# Patient Record
Sex: Male | Born: 1945 | Race: White | Hispanic: No | Smoking: Former smoker
Health system: Southern US, Community
[De-identification: ages and names within clinical notes are randomized; demographics above are authoritative.]

## PROBLEM LIST (undated history)

## (undated) DIAGNOSIS — T753XXA Motion sickness, initial encounter: Secondary | ICD-10-CM

## (undated) DIAGNOSIS — I2510 Atherosclerotic heart disease of native coronary artery without angina pectoris: Secondary | ICD-10-CM

## (undated) DIAGNOSIS — F329 Major depressive disorder, single episode, unspecified: Secondary | ICD-10-CM

## (undated) DIAGNOSIS — K635 Polyp of colon: Secondary | ICD-10-CM

## (undated) DIAGNOSIS — F32A Depression, unspecified: Secondary | ICD-10-CM

## (undated) DIAGNOSIS — F1010 Alcohol abuse, uncomplicated: Secondary | ICD-10-CM

## (undated) DIAGNOSIS — E785 Hyperlipidemia, unspecified: Secondary | ICD-10-CM

## (undated) DIAGNOSIS — E119 Type 2 diabetes mellitus without complications: Secondary | ICD-10-CM

## (undated) DIAGNOSIS — I10 Essential (primary) hypertension: Secondary | ICD-10-CM

## (undated) HISTORY — PX: TONSILLECTOMY: SUR1361

## (undated) HISTORY — PX: COLONOSCOPY: SHX174

## (undated) HISTORY — DX: Hyperlipidemia, unspecified: E78.5

## (undated) HISTORY — DX: Atherosclerotic heart disease of native coronary artery without angina pectoris: I25.10

## (undated) HISTORY — DX: Polyp of colon: K63.5

## (undated) HISTORY — DX: Type 2 diabetes mellitus without complications: E11.9

## (undated) HISTORY — DX: Alcohol abuse, uncomplicated: F10.10

## (undated) HISTORY — DX: Major depressive disorder, single episode, unspecified: F32.9

## (undated) HISTORY — DX: Depression, unspecified: F32.A

## (undated) HISTORY — DX: Essential (primary) hypertension: I10

---

## 2002-05-20 DIAGNOSIS — E119 Type 2 diabetes mellitus without complications: Secondary | ICD-10-CM

## 2002-05-20 DIAGNOSIS — E785 Hyperlipidemia, unspecified: Secondary | ICD-10-CM

## 2002-05-20 DIAGNOSIS — I10 Essential (primary) hypertension: Secondary | ICD-10-CM

## 2002-05-20 HISTORY — DX: Hyperlipidemia, unspecified: E78.5

## 2002-05-20 HISTORY — DX: Type 2 diabetes mellitus without complications: E11.9

## 2002-05-20 HISTORY — DX: Essential (primary) hypertension: I10

## 2002-07-20 LAB — FECAL OCCULT BLOOD, GUAIAC: Fecal Occult Blood: NEGATIVE

## 2004-10-17 ENCOUNTER — Ambulatory Visit: Payer: Self-pay | Admitting: Family Medicine

## 2004-11-23 ENCOUNTER — Ambulatory Visit: Payer: Self-pay | Admitting: Family Medicine

## 2005-04-20 DIAGNOSIS — I2510 Atherosclerotic heart disease of native coronary artery without angina pectoris: Secondary | ICD-10-CM

## 2005-04-20 HISTORY — PX: CORONARY ARTERY BYPASS GRAFT: SHX141

## 2005-04-20 HISTORY — DX: Atherosclerotic heart disease of native coronary artery without angina pectoris: I25.10

## 2005-05-06 ENCOUNTER — Inpatient Hospital Stay (HOSPITAL_COMMUNITY): Admission: EM | Admit: 2005-05-06 | Discharge: 2005-05-14 | Payer: Self-pay | Admitting: Emergency Medicine

## 2005-05-06 ENCOUNTER — Ambulatory Visit: Payer: Self-pay | Admitting: Internal Medicine

## 2005-05-30 ENCOUNTER — Ambulatory Visit: Payer: Self-pay | Admitting: Internal Medicine

## 2005-06-05 ENCOUNTER — Ambulatory Visit: Payer: Self-pay | Admitting: Family Medicine

## 2005-06-20 ENCOUNTER — Ambulatory Visit: Payer: Self-pay

## 2005-06-21 ENCOUNTER — Encounter (HOSPITAL_COMMUNITY): Admission: RE | Admit: 2005-06-21 | Discharge: 2005-08-17 | Payer: Self-pay | Admitting: Internal Medicine

## 2005-08-14 ENCOUNTER — Ambulatory Visit: Payer: Self-pay | Admitting: Internal Medicine

## 2005-08-14 ENCOUNTER — Encounter: Payer: Self-pay | Admitting: Internal Medicine

## 2005-08-14 ENCOUNTER — Ambulatory Visit: Payer: Self-pay

## 2005-08-17 ENCOUNTER — Ambulatory Visit: Payer: Self-pay | Admitting: Cardiology

## 2005-11-21 ENCOUNTER — Ambulatory Visit: Payer: Self-pay | Admitting: Family Medicine

## 2005-12-07 IMAGING — CR DG CHEST 2V
2 series · 2 of 2 positions shown · non-contrast
Comparison: none

CLINICAL DATA: Angina, previous CABG

Chest 2 view:
Comparison 05/11/2005. Persistent right pneumothorax estimated to 15%, without
convincing interval change since previous exam. Changes of CABG. Small bilateral
pleural effusions. Mild enlargement of cardiac silhouette. Linear atelectasis or
scarring at both lung bases.

[view not recorded (1 of 2)]
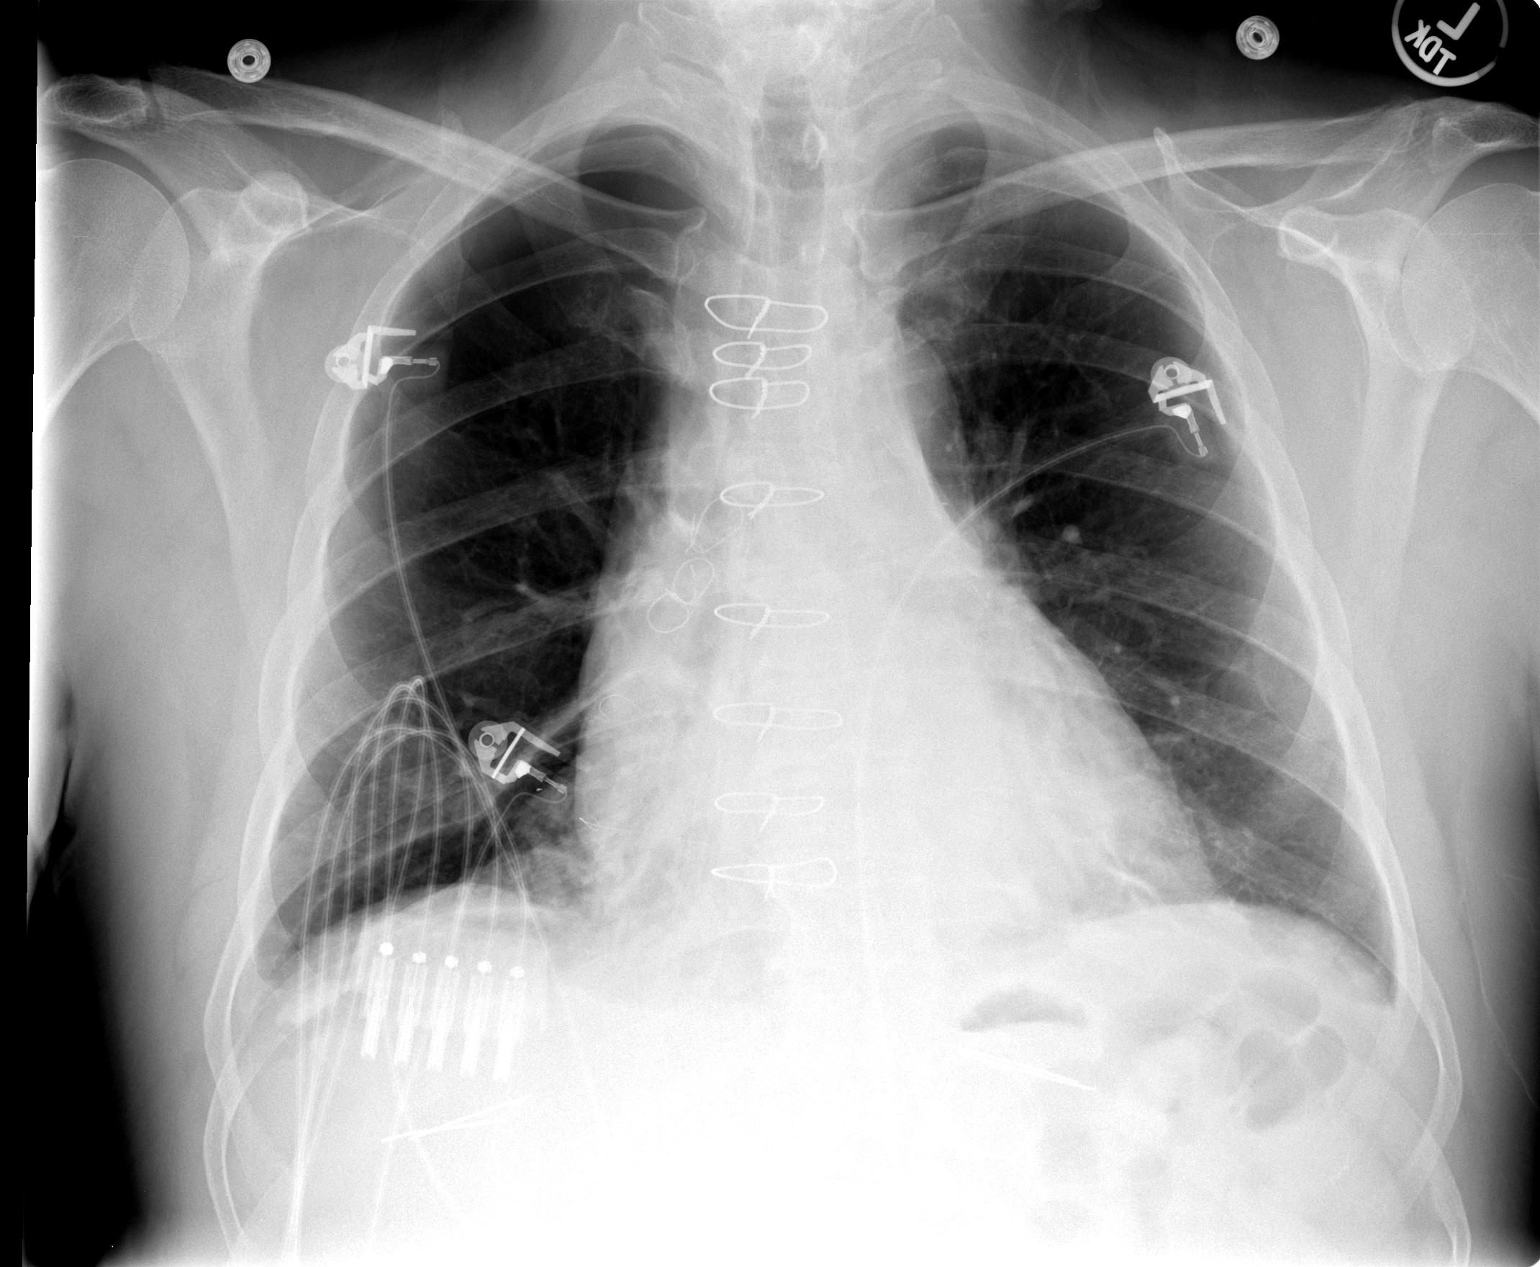

[view not recorded (2 of 2)]
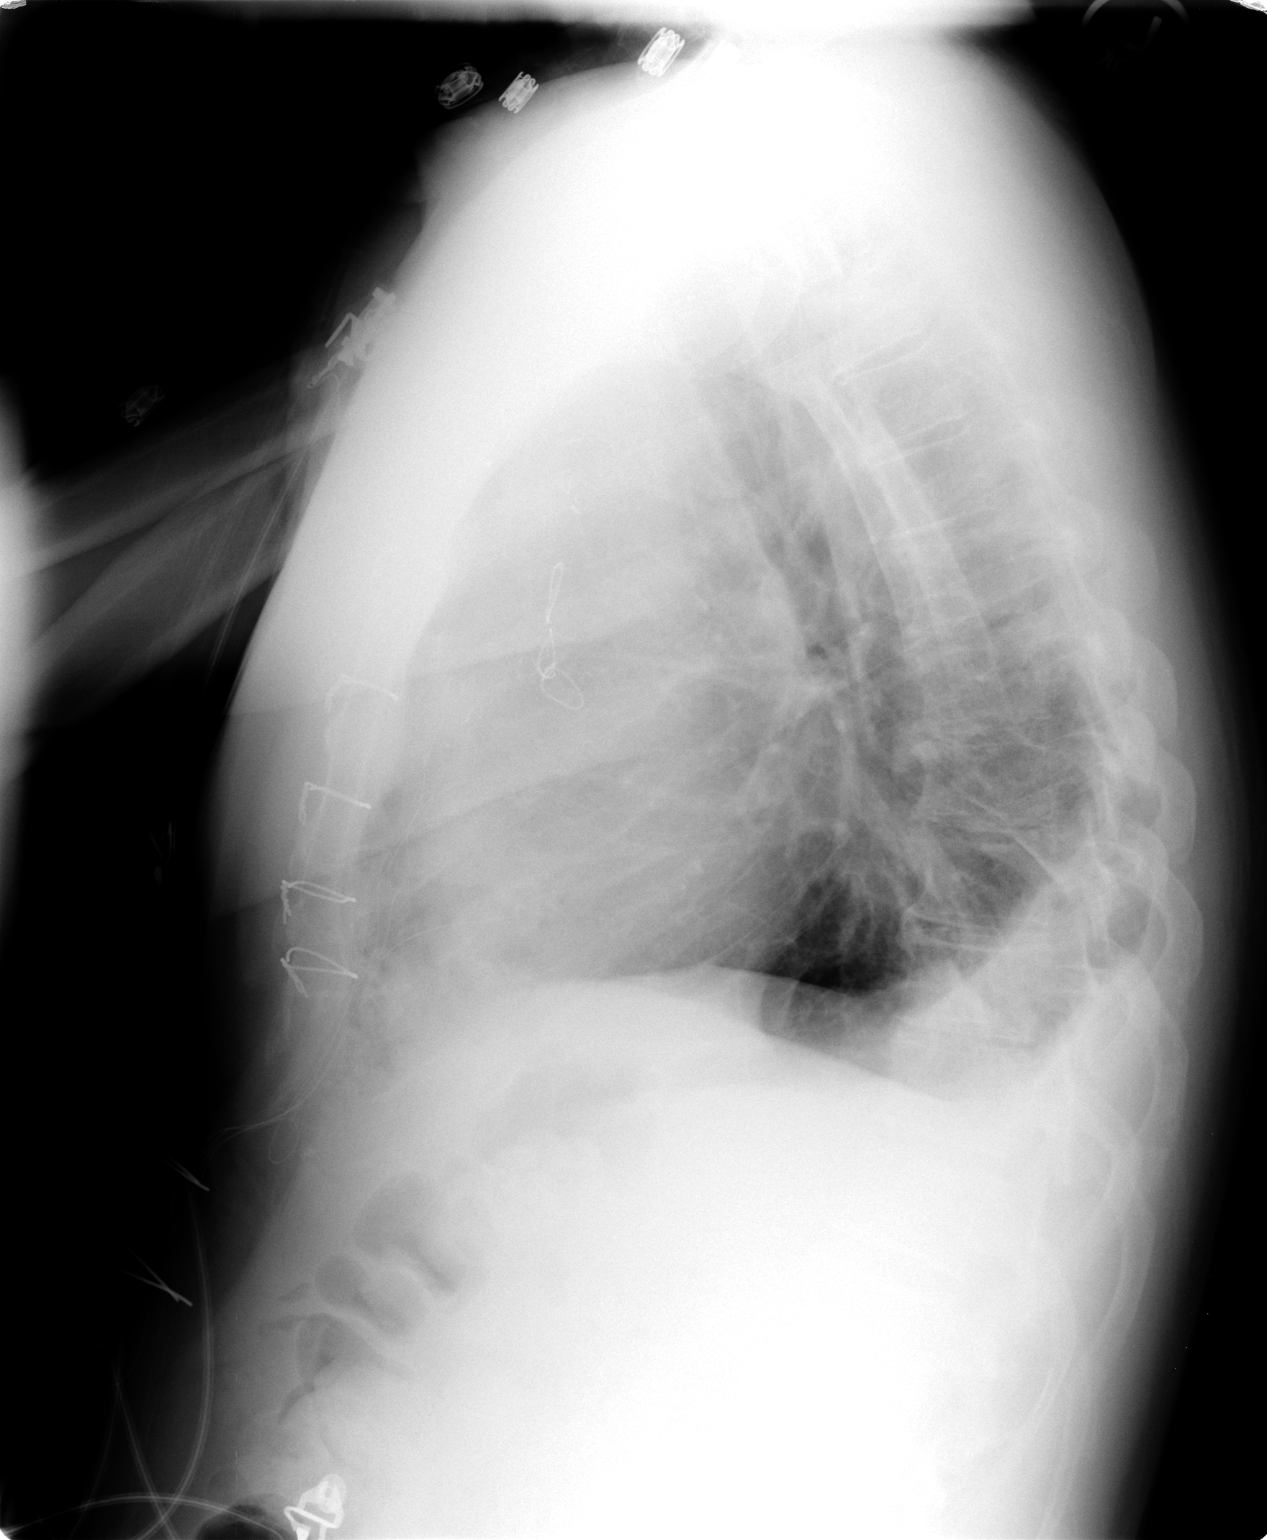

[2 of 2 positions shown; findings below may reference images not displayed]

IMPRESSION: 1. Persistent right pneumothorax.
2. Small bilateral pleural effusions

## 2005-12-08 IMAGING — CR DG CHEST 2V
2 series · 2 of 2 positions shown · non-contrast
Comparison: none

CLINICAL DATA: Angina

Chest 2 view:
Comparison 05/12/2005. Changes of CABG. There is a right pneumothorax estimated
15%, stable since previous exam. Left infrahilar atelectasis or infiltrate.
Small bilateral pleural effusions. Heart size upper limits normal.

[w chest pa]
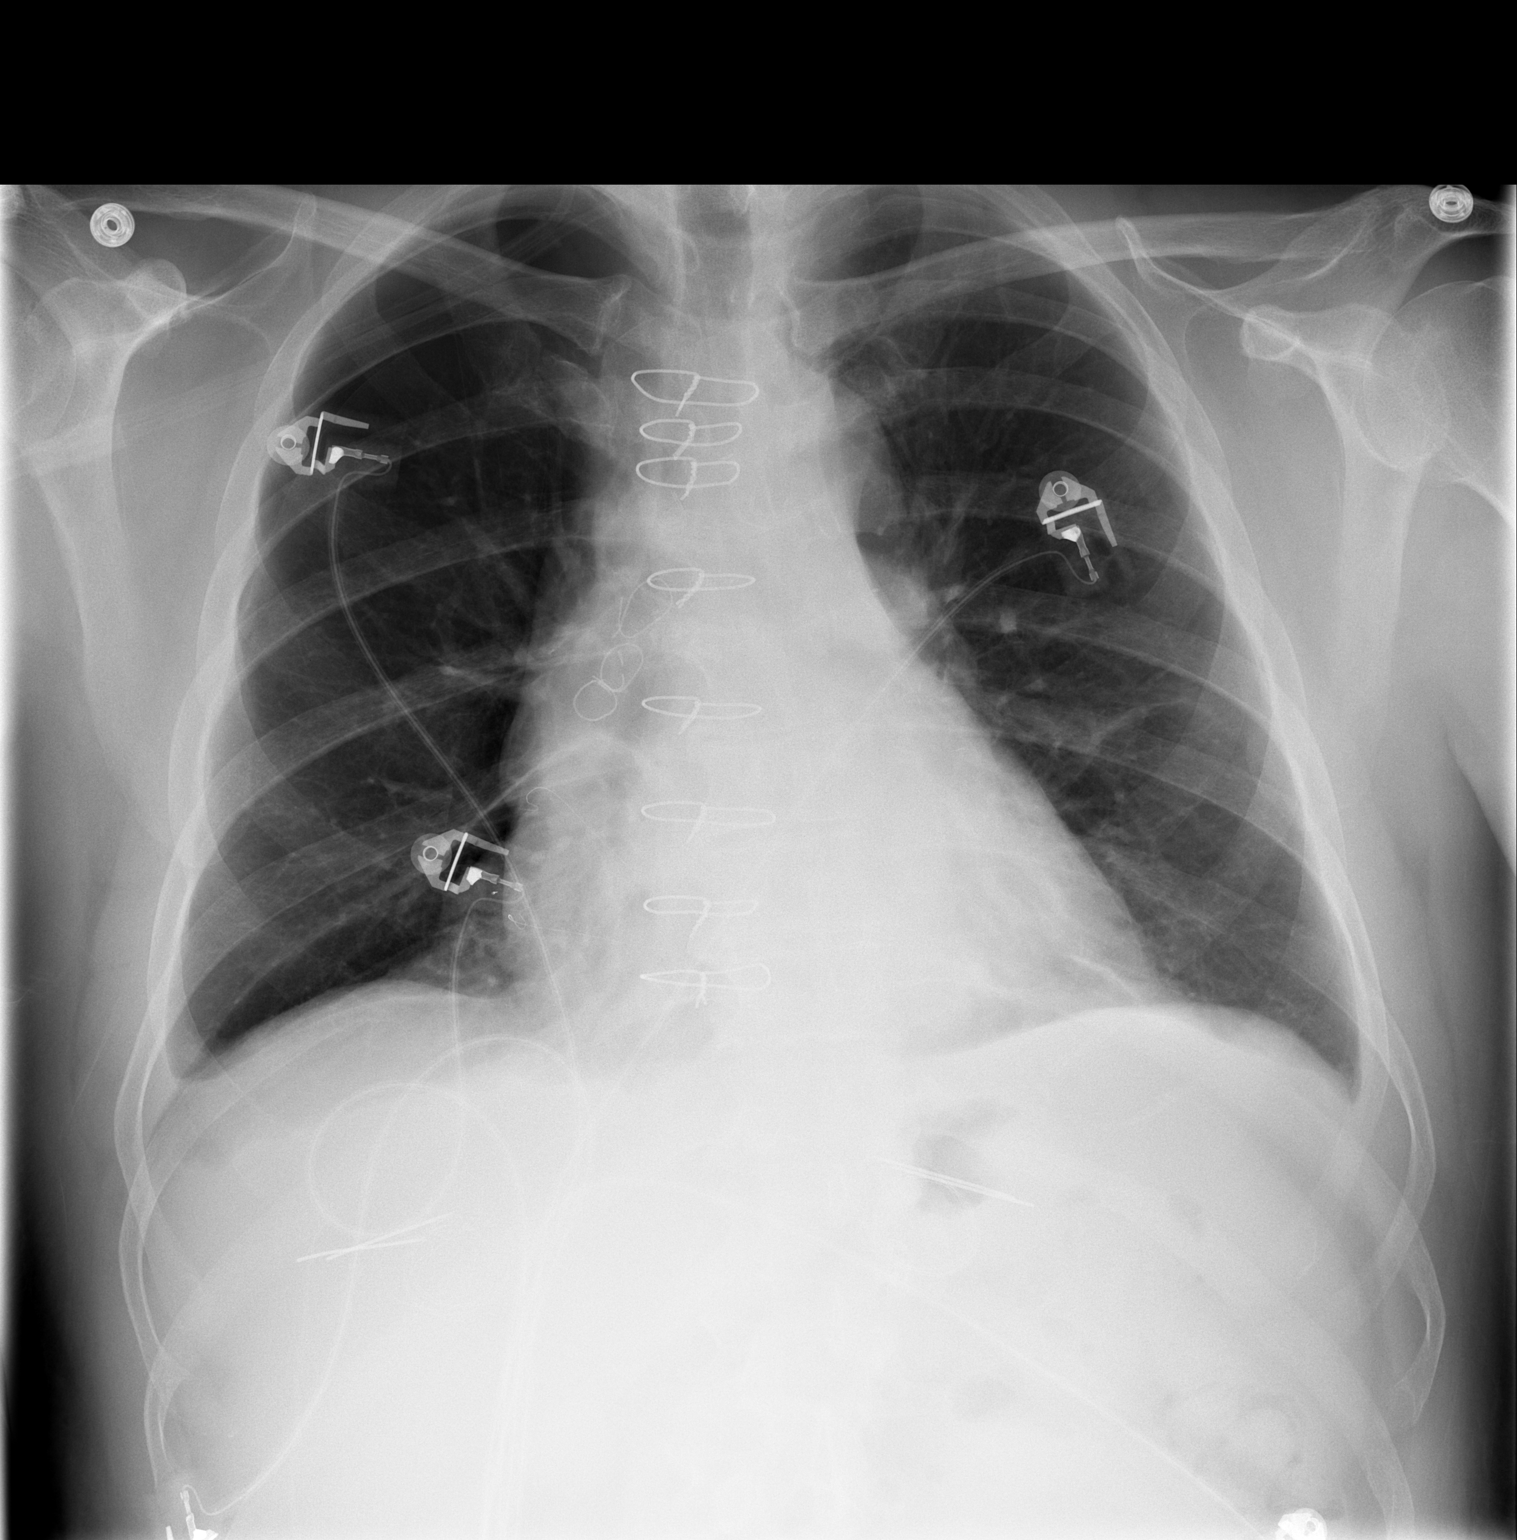

[w chest lat]
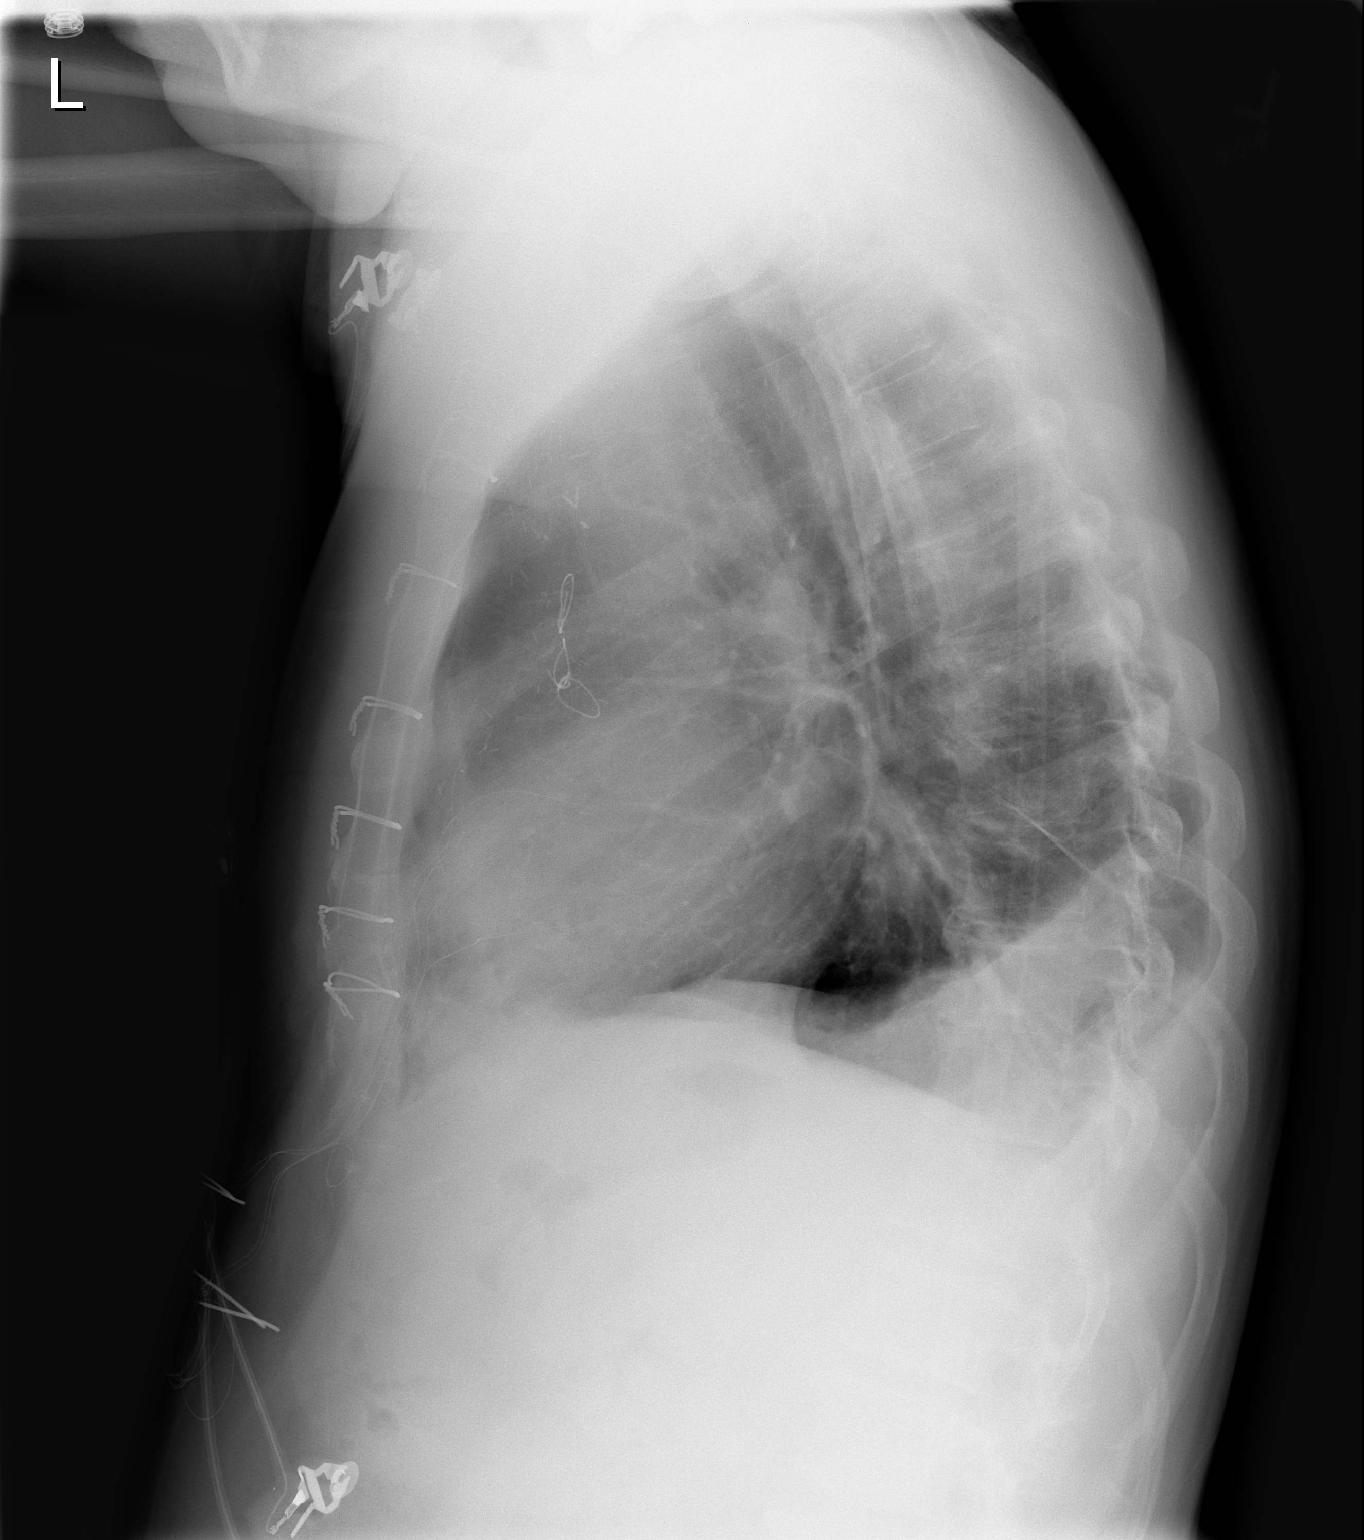

[2 of 2 positions shown; findings below may reference images not displayed]

IMPRESSION: 1. Stable right pneumothorax.
2. Small bilateral pleural effusions

## 2006-01-18 ENCOUNTER — Ambulatory Visit: Payer: Self-pay | Admitting: Family Medicine

## 2006-02-05 ENCOUNTER — Ambulatory Visit: Payer: Self-pay | Admitting: Internal Medicine

## 2006-02-17 HISTORY — PX: ESOPHAGOGASTRODUODENOSCOPY: SHX1529

## 2006-02-26 ENCOUNTER — Encounter (INDEPENDENT_AMBULATORY_CARE_PROVIDER_SITE_OTHER): Payer: Self-pay | Admitting: Specialist

## 2006-02-26 ENCOUNTER — Ambulatory Visit: Payer: Self-pay | Admitting: Internal Medicine

## 2006-03-22 ENCOUNTER — Ambulatory Visit: Payer: Self-pay | Admitting: Internal Medicine

## 2006-04-16 ENCOUNTER — Ambulatory Visit: Payer: Self-pay | Admitting: Internal Medicine

## 2006-11-28 ENCOUNTER — Ambulatory Visit: Payer: Self-pay | Admitting: Family Medicine

## 2006-11-28 LAB — CONVERTED CEMR LAB
ALT: 40 units/L (ref 0–40)
Albumin: 3.9 g/dL (ref 3.5–5.2)
Calcium: 9.5 mg/dL (ref 8.4–10.5)
Chloride: 107 meq/L (ref 96–112)
Glucose, Bld: 99 mg/dL (ref 70–99)
HDL: 33.9 mg/dL — ABNORMAL LOW (ref >39.0)
Hemoglobin: 15.9 g/dL (ref 13.0–17.0)
LDL Cholesterol: 75 mg/dL (ref 0–99)
Lymphocytes Relative: 29.5 % (ref 12.0–46.0)
MCHC: 33.6 g/dL (ref 30.0–36.0)
Monocytes Absolute: 0.6 10*3/uL (ref 0.2–0.7)
Neutrophils Relative %: 56.2 % (ref 43.0–77.0)
Phosphorus: 3.4 mg/dL (ref 2.3–4.6)
Platelets: 185 10*3/uL (ref 150–400)
Potassium: 4.2 meq/L (ref 3.5–5.1)
RDW: 13.1 % (ref 11.5–14.6)
VLDL: 30 mg/dL (ref 0–40)
WBC: 5.1 10*3/uL (ref 4.5–10.5)

## 2006-12-03 ENCOUNTER — Encounter: Payer: Self-pay | Admitting: Family Medicine

## 2006-12-03 DIAGNOSIS — F329 Major depressive disorder, single episode, unspecified: Secondary | ICD-10-CM

## 2006-12-03 DIAGNOSIS — F3289 Other specified depressive episodes: Secondary | ICD-10-CM | POA: Insufficient documentation

## 2007-01-08 ENCOUNTER — Ambulatory Visit: Payer: Self-pay | Admitting: Internal Medicine

## 2007-01-08 LAB — CONVERTED CEMR LAB: Tissue Transglutaminase Ab, IgA: <3 units (ref <5)

## 2007-03-05 ENCOUNTER — Encounter: Payer: Self-pay | Admitting: Internal Medicine

## 2007-03-05 ENCOUNTER — Encounter: Payer: Self-pay | Admitting: Family Medicine

## 2007-03-05 ENCOUNTER — Ambulatory Visit: Payer: Self-pay | Admitting: Internal Medicine

## 2007-03-12 DIAGNOSIS — Z860100 Personal history of colon polyps, unspecified: Secondary | ICD-10-CM | POA: Insufficient documentation

## 2007-12-30 ENCOUNTER — Ambulatory Visit: Payer: Self-pay | Admitting: Internal Medicine

## 2008-03-08 ENCOUNTER — Telehealth (INDEPENDENT_AMBULATORY_CARE_PROVIDER_SITE_OTHER): Payer: Self-pay | Admitting: *Deleted

## 2008-03-22 ENCOUNTER — Telehealth (INDEPENDENT_AMBULATORY_CARE_PROVIDER_SITE_OTHER): Payer: Self-pay | Admitting: *Deleted

## 2008-05-10 ENCOUNTER — Telehealth (INDEPENDENT_AMBULATORY_CARE_PROVIDER_SITE_OTHER): Payer: Self-pay | Admitting: *Deleted

## 2008-05-13 ENCOUNTER — Ambulatory Visit: Payer: Self-pay | Admitting: Family Medicine

## 2008-05-13 DIAGNOSIS — E785 Hyperlipidemia, unspecified: Secondary | ICD-10-CM | POA: Insufficient documentation

## 2009-02-18 ENCOUNTER — Encounter (INDEPENDENT_AMBULATORY_CARE_PROVIDER_SITE_OTHER): Payer: Self-pay | Admitting: *Deleted

## 2009-02-28 ENCOUNTER — Encounter: Payer: Self-pay | Admitting: Internal Medicine

## 2009-04-12 ENCOUNTER — Ambulatory Visit: Payer: Self-pay | Admitting: Family Medicine

## 2009-04-13 ENCOUNTER — Ambulatory Visit: Payer: Self-pay | Admitting: Family Medicine

## 2009-04-13 DIAGNOSIS — E119 Type 2 diabetes mellitus without complications: Secondary | ICD-10-CM | POA: Insufficient documentation

## 2009-04-13 DIAGNOSIS — E1165 Type 2 diabetes mellitus with hyperglycemia: Secondary | ICD-10-CM

## 2009-04-13 DIAGNOSIS — IMO0002 Reserved for concepts with insufficient information to code with codable children: Secondary | ICD-10-CM | POA: Insufficient documentation

## 2009-04-13 DIAGNOSIS — R5381 Other malaise: Secondary | ICD-10-CM | POA: Insufficient documentation

## 2009-04-13 DIAGNOSIS — R5383 Other fatigue: Secondary | ICD-10-CM

## 2009-04-14 LAB — CONVERTED CEMR LAB
ALT: 37 units/L (ref 0–53)
AST: 27 units/L (ref 0–37)
Albumin: 4.4 g/dL (ref 3.5–5.2)
BUN: 14 mg/dL (ref 6–23)
Basophils Relative: 0.2 % (ref 0.0–3.0)
Bilirubin, Direct: 0.2 mg/dL (ref 0.0–0.3)
CO2: 32 meq/L (ref 19–32)
Cholesterol: 134 mg/dL (ref 0–200)
Creatinine, Ser: 1.3 mg/dL (ref 0.4–1.5)
Eosinophils Absolute: 0.2 10*3/uL (ref 0.0–0.7)
Eosinophils Relative: 3.5 % (ref 0.0–5.0)
Lymphocytes Relative: 26.5 % (ref 12.0–46.0)
MCHC: 34.5 g/dL (ref 30.0–36.0)
MCV: 92.7 fL (ref 78.0–100.0)
Monocytes Relative: 12 % (ref 3.0–12.0)
Neutro Abs: 3.1 10*3/uL (ref 1.4–7.7)
Neutrophils Relative %: 57.8 % (ref 43.0–77.0)
PSA: 0.92 ng/mL (ref 0.10–4.00)
Platelets: 162 10*3/uL (ref 150.0–400.0)
Potassium: 4.3 meq/L (ref 3.5–5.1)
RDW: 12.2 % (ref 11.5–14.6)
Total Bilirubin: 1.6 mg/dL — ABNORMAL HIGH (ref 0.3–1.2)
Total Protein: 7.6 g/dL (ref 6.0–8.3)

## 2009-04-15 ENCOUNTER — Encounter (INDEPENDENT_AMBULATORY_CARE_PROVIDER_SITE_OTHER): Payer: Self-pay | Admitting: *Deleted

## 2009-05-27 ENCOUNTER — Ambulatory Visit: Payer: Self-pay | Admitting: Internal Medicine

## 2009-06-10 ENCOUNTER — Ambulatory Visit: Payer: Self-pay | Admitting: Internal Medicine

## 2009-06-10 ENCOUNTER — Encounter: Payer: Self-pay | Admitting: Internal Medicine

## 2009-06-14 ENCOUNTER — Encounter: Payer: Self-pay | Admitting: Internal Medicine

## 2010-08-10 ENCOUNTER — Ambulatory Visit: Payer: Self-pay | Admitting: Family Medicine

## 2010-08-10 DIAGNOSIS — F1010 Alcohol abuse, uncomplicated: Secondary | ICD-10-CM

## 2010-08-15 LAB — CONVERTED CEMR LAB
Basophils Absolute: 0 10*3/uL (ref 0.0–0.1)
Basophils Relative: 0.5 % (ref 0.0–3.0)
Calcium: 9.7 mg/dL (ref 8.4–10.5)
Creatinine, Ser: 1.1 mg/dL (ref 0.4–1.5)
Eosinophils Relative: 3.9 % (ref 0.0–5.0)
HCT: 45.5 % (ref 39.0–52.0)
HDL: 33 mg/dL — ABNORMAL LOW (ref >39.00)
Hemoglobin: 16.1 g/dL (ref 13.0–17.0)
LDL Cholesterol: 86 mg/dL (ref 0–99)
Lymphocytes Relative: 31.2 % (ref 12.0–46.0)
Lymphs Abs: 1.4 10*3/uL (ref 0.7–4.0)
MCHC: 35.4 g/dL (ref 30.0–36.0)
MCV: 92.2 fL (ref 78.0–100.0)
Monocytes Absolute: 0.5 10*3/uL (ref 0.1–1.0)
Neutro Abs: 2.3 10*3/uL (ref 1.4–7.7)
Neutrophils Relative %: 54 % (ref 43.0–77.0)
Total Bilirubin: 1.3 mg/dL — ABNORMAL HIGH (ref 0.3–1.2)
Triglycerides: 132 mg/dL (ref 0.0–149.0)

## 2010-09-21 NOTE — Assessment & Plan Note (Signed)
Summary: CPX/JRR PER HEATHER   Vital Signs:  Patient profile:   65 year old male Height:      68 inches Weight:      182.50 pounds BMI:     27.85 Temp:     98.6 degrees F oral Pulse rate:   72 / minute Pulse rhythm:   regular BP sitting:   140 / 100  (right arm) Cuff size:   regular  Vitals Entered By: Linde Gillis CMA Duncan Dull) (August 10, 2010 11:52 AM) CC: complete physician   History of Present Illness: 65 yo  CPX  150's/90.  needs full blood work  ask about Zostavax  Blood pressure Stress at home, son at home - added to the stress. (his significant other)   Good tequila.  Father was an alcoholic  Brother was an Tourist information centre manager.     Preventive Screening-Counseling & Management  Alcohol-Tobacco     Alcohol drinks/day: 5     Alcohol type: spirits     Alcohol Counseling: to STOP drinking     Feels need to cut down: yes     Feels annoyed by complaints: yes     Feels guilty re: drinking: yes     Needs 'eye opener' in am: no     Smoking Status: quit     Year Quit: 2008     Tobacco Counseling: to remain off tobacco products  Caffeine-Diet-Exercise     Diet Counseling: not indicated; diet is assessed to be healthy     Does Patient Exercise: yes     Type of exercise: bike     Exercise (avg: min/session): 30-60     Times/week: <3     Exercise Counseling: to improve exercise regimen  Hep-HIV-STD-Contraception     Hepatitis Risk: no risk noted     HIV Risk: no risk noted     STD Risk: no risk noted     STD Risk Counseling: not indicated-no STD risk noted     Contraception Counseling: not indicated; no questions/concerns expressed     Testicular SE Education/Counseling to perform regular STE     Sun Exposure-Excessive: no  Safety-Violence-Falls     Violence Counseling: not indicated; no violence risk noted     Sexual Abuse: no      Sexual History:  currently monogamous.        Drug Use:  never.    Clinical Review Panels:  Prevention   Last Colonoscopy:   DONE (06/10/2009)   Last PSA:  0.92 (04/13/2009)  Immunizations   Last Tetanus Booster:  Td (04/12/2009)   Last Flu Vaccine:  Fluvax 3+ (08/10/2010)  Lipid Management   Cholesterol:  134 (04/13/2009)   LDL (bad choesterol):  76 (04/13/2009)   HDL (good cholesterol):  37.00 (04/13/2009)  Diabetes Management   HgBA1C:  6.4 (04/13/2009)   Creatinine:  1.3 (04/13/2009)   Last Flu Vaccine:  Fluvax 3+ (08/10/2010)  CBC   WBC:  5.5 (04/13/2009)   RBC:  5.07 (04/13/2009)   Hgb:  16.2 (04/13/2009)   Hct:  47.0 (04/13/2009)   Platelets:  162.0 (04/13/2009)   MCV  92.7 (04/13/2009)   MCHC  34.5 (04/13/2009)   RDW  12.2 (04/13/2009)   PMN:  57.8 (04/13/2009)   Lymphs:  26.5 (04/13/2009)   Monos:  12.0 (04/13/2009)   Eosinophils:  3.5 (04/13/2009)   Basophil:  0.2 (04/13/2009)  Complete Metabolic Panel   Glucose:  126 (04/13/2009)   Sodium:  142 (04/13/2009)   Potassium:  4.3 (04/13/2009)  Chloride:  103 (04/13/2009)   CO2:  32 (04/13/2009)   BUN:  14 (04/13/2009)   Creatinine:  1.3 (04/13/2009)   Albumin:  4.4 (04/13/2009)   Total Protein:  7.6 (04/13/2009)   Calcium:  9.6 (04/13/2009)   Total Bili:  1.6 (04/13/2009)   Alk Phos:  58 (04/13/2009)   SGPT (ALT):  37 (04/13/2009)   SGOT (AST):  27 (04/13/2009)   Allergies: 1)  ! Sulfa  Review of Systems  General: Denies fever, chills, sweats, anorexia, fatigue, weakness, malaise Eyes: Denies blurring, vision loss ENT: Denies earache, nasal congestion, nosebleeds, sore throat, and hoarseness.  Cardiovascular: Denies chest pains, palpitations, syncope, dyspnea on exertion,  Respiratory: Denies cough, dyspnea at rest, excessive sputum,wheeezing GI: Denies nausea, vomiting, diarrhea, constipation, change in bowel habits, abdominal pain, melena, hematochezia GU: Denies dysuria, hematuria, discharge, urinary frequency, urinary hesitancy, nocturia, incontinence, genital sores, decreased libido Musculoskeletal: Denies back pain,  joint pain Derm: Denies rash, itching Neuro: Denies  paresthesias, frequent falls, frequent headaches, and difficulty walking.  Psych: Denies depression, anxiety Endocrine: Denies cold intolerance, heat intolerance, polydipsia, polyphagia, polyuria, and unusual weight change.  Heme: Denies enlarged lymph nodes Allergy: No hayfever   Otherwise, the pertinent positives and negatives are listed above and in the HPI, otherwise a full review of systems has been reviewed and is negative unless noted positive.    Impression & Recommendations:  Problem # 1:  HEALTH MAINTENANCE EXAM (ICD-V70.0) The patient's preventative maintenance and recommended screening tests for an annual wellness exam were reviewed in full today. Brought up to date unless services declined.  Counselled on the importance of diet, exercise, and its role in overall health and mortality. The patient's FH and SH was reviewed, including their home life, tobacco status, and drug and alcohol status.   #1 goal is to decrease ETOH  Orders: TLB-BMP (Basic Metabolic Panel-BMET) (80048-METABOL) TLB-TSH (Thyroid Stimulating Hormone) (84443-TSH) TLB-Hepatic/Liver Function Pnl (80076-HEPATIC) TLB-CBC Platelet - w/Differential (85025-CBCD)  Problem # 2:  ALCOHOL ABUSE (ICD-305.00) Assessment: New >15 minutes spent in face to face time with patient, >50% spent in counselling or coordination of care: 100% spent in discussion about tequila alcohol use, abuse, AA, that 3-8 oz of tequila a night is not normal. I suspect this is causing a rebound HTN. Strong FH ETOH. Rec AA and d/c  Problem # 3:  HYPERLIPIDEMIA (ICD-272.4)  His updated medication list for this problem includes:    Lipitor 80 Mg Tabs (Atorvastatin calcium) ..... One tab by mouth at bedtime  Orders: Venipuncture (84696) TLB-Lipid Panel (80061-LIPID)  Labs Reviewed: SGOT: 27 (04/13/2009)   SGPT: 37 (04/13/2009)   HDL:37.00 (04/13/2009), 33.9 (11/28/2006)  LDL:76  (04/13/2009), 75 (29/52/8413)  Chol:134 (04/13/2009), 139 (11/28/2006)  Trig:104.0 (04/13/2009), 152 (11/28/2006)  Complete Medication List: 1)  Lipitor 80 Mg Tabs (Atorvastatin calcium) .... One tab by mouth at bedtime 2)  Metoprolol Tartrate 50 Mg Tabs (Metoprolol tartrate) .... One by mouth bid 3)  Multivitamins Tabs (Multiple vitamin) .... Take one tablet daily 4)  Bayer Aspirin 325 Mg Tabs (Aspirin) .... Take one tablet daily 5)  Fish Oil 1000 Mg Caps (Omega-3 fatty acids) .... Take two capsules daily 6)  Exforge 5-160 Mg Tabs (Amlodipine besylate-valsartan) .Marland Kitchen.. 1 tab once daily  Other Orders: T-PSA Total ([address]) Admin 1st Vaccine (53664) Flu Vaccine 10yrs + (40347)  Patient Instructions: 1)  CALL BLUE CROSS AND ASK ABOUT ZOSTAVAX VACCINE   Orders Added: 1)  Venipuncture [36415] 2)  TLB-Lipid Panel [80061-LIPID] 3)  TLB-BMP (Basic  Metabolic Panel-BMET) [80048-METABOL] 4)  TLB-TSH (Thyroid Stimulating Hormone) [84443-TSH] 5)  TLB-Hepatic/Liver Function Pnl [80076-HEPATIC] 6)  TLB-CBC Platelet - w/Differential [85025-CBCD] 7)  T-PSA Total [[address]] 8)  Admin 1st Vaccine [90471] 9)  Flu Vaccine 81yrs + [90658] 10)  Est. Patient 40-64 years [99396] 11)  Est. Patient Level III [46962]    Current Allergies (reviewed today): ! SULFA  Prevention & Chronic Care Immunizations   Influenza vaccine: Fluvax 3+  (08/10/2010)    Tetanus booster: 04/12/2009: Td   Tetanus booster due: 08/21/2003    Pneumococcal vaccine: Not documented    H. zoster vaccine: Not documented   H. zoster vaccine deferral: Refused  (08/10/2010)  Colorectal Screening   Hemoccult: Negative  (07/20/2002)   Hemoccult due: 07/21/2003    Colonoscopy: DONE  (06/10/2009)   Colonoscopy due: 05/2012  Other Screening   PSA: 0.92  (04/13/2009)   PSA ordered.   PSA action/deferral: Discussed-PSA requested  (04/12/2009)   PSA due due: 04/13/2010   Smoking status: quit  (08/10/2010)  Diabetes  Mellitus   HgbA1C: 6.4  (04/13/2009)   Hemoglobin A1C due: 02/27/2007    Eye exam: Not documented    Foot exam: Not documented   High risk foot: Not documented   Foot care education: Not documented    Urine microalbumin/creatinine ratio: Not documented  Lipids   Total Cholesterol: 134  (04/13/2009)   Lipid panel action/deferral: Lipid Panel ordered   LDL: 76  (04/13/2009)   LDL Direct: Not documented   HDL: 37.00  (04/13/2009)   Triglycerides: 104.0  (04/13/2009)    SGOT (AST): 27  (04/13/2009)   BMP action: Ordered   SGPT (ALT): 37  (04/13/2009)   Alkaline phosphatase: 58  (04/13/2009)   Total bilirubin: 1.6  (04/13/2009)  Self-Management Support :    Diabetes self-management support: Not documented    Lipid self-management support: Not documented    Nursing Instructions: Give Flu vaccine today                Flu Vaccine Consent Questions     Do you have a history of severe allergic reactions to this vaccine? no    Any prior history of allergic reactions to egg and/or gelatin? no    Do you have a sensitivity to the preservative Thimersol? no    Do you have a past history of Guillan-Barre Syndrome? no    Do you currently have an acute febrile illness? no    Have you ever had a severe reaction to latex? no    Vaccine information given and explained to patient? yes    Are you currently pregnant? no    Lot Number:[id number]   Exp Date:02/17/2011   Site Given  Left Deltoid IM .lbflu1   Physical Exam General Appearance: well developed, well nourished, no acute distress Eyes: conjunctiva and lids normal, PERRLA, EOMI Ears, Nose, Mouth, Throat: TM clear, nares clear, oral exam WNL Neck: supple, no lymphadenopathy, no thyromegaly, no JVD Respiratory: clear to auscultation and percussion, respiratory effort normal Cardiovascular: regular rate and rhythm, S1-S2, no murmur, rub or gallop, no bruits, peripheral pulses normal and symmetric, no cyanosis,  clubbing, edema or varicosities Chest: no scars, masses, tenderness; no asymmetry, skin changes, nipple discharge, no gynecomastia   Gastrointestinal: soft, non-tender; no hepatosplenomegaly, masses; active bowel sounds all quadrants, no masses, tenderness, hemorrhoids  Genitourinary: no hernia, testicular mass, penile discharge, priapism or prostate enlargement Lymphatic: no cervical, axillary or inguinal adenopathy Musculoskeletal: gait normal, muscle tone and strength WNL, no  joint swelling, effusions, discoloration, crepitus  Skin: clear, good turgor, color WNL, no rashes, lesions, or ulcerations Neurologic: normal mental status, normal reflexes, normal strength, sensation, and motion Psychiatric: alert; oriented to person, place and time Other Exam:

## 2011-01-02 NOTE — Assessment & Plan Note (Signed)
[facility]                         GASTROENTEROLOGY OFFICE NOTE   [name], [name]                       MRN:          [id number]  DATE:01/08/2007                            DOB:          04-19-1946    CHIEF COMPLAINT:  Followup of polyps.   PROBLEMS:  1. Colonoscopy February 26, 2006 demonstrating two villous adenomas, one      with high-grade dysplasia and one sessile and one pedunculated.  2. External hemorrhoids on same colonoscopy.  3. Upper GI endoscopy February 26, 2006 demonstrating antral polyp,      removed with snare polypectomy. Pathology-hyperplastic polyp.      Biopsies also showed reactive changes, mild chronic inflammation.      The duodenal biopsies showed duodenitis, thought to be peptic and      not sprue. Biopsies negative for H-pylori in the stomach.  4. Coronary artery disease with coronary artery bypass grafting after      myocardial infarction in 2006 (Dr.  Laneta Simmers).  5. Hypertension.  6. Dyslipidemia.  7. Prior smoker.  8. History of alcohol abuse.  9. Diabetes.  10.Possible mild renal artery stenosis seen on ultrasound November      2006.  11.History of melena and iron-deficiency anemia which prompted the      work up last year as described above.  12.He is the caregiver for his wife who is stricken with Alzheimer's      disease.   MEDICATIONS:  1. Lipitor 80 mg daily.  2. Multivitamin daily.  3. Iron, still takes daily.  4. Metoprolol, dose unknown, two a day.  5. Aspirin 325 mg daily.   ALLERGIES:  POSSIBLY TO SULFA.   HISTORY:  I think he was sent back by Dr.  Milinda Antis regarding his polyp  followup. We had a recall about to go out to him as well. He had a GI  virus or bacterial gastroenteritis a couple of months ago or a few  months ago and since that time, he has had loose stools, usually  controllable, sometimes urgent. No bleeding. He continues to take iron  even though his anemia resolved and he was told to  stop it. He takes it  once a day. He is having some problems where he has to clear his throat  a lot. No clear-cut sinus drainage or anything like that. Weight is up  to 177 pounds. Home life is still stressful. Job is stressful. His wife  is continuing to deteriorate with her Alzheimer's.   PHYSICAL EXAMINATION:  Weight is 177 pounds, pulse 72, blood pressure  140/90.  PHARYNX: Shows some mild cobblestoning and erythema. Tongue and oral  mucosa normal.   ASSESSMENT:  1. Personal history of colon polyps, advanced adenomatous, one with      high-grade dysplasia.  2. History of GI bleeding and iron-deficiency anemia. Either the      gastric polyps or the colon polyps could have led to this. Etiology      was never 100% clear. That has stopped however.  3. Diarrhea after a gastroenteritis, question irritable bowel  syndrome. His mother had Crohn's, question sprue. The biopsies      probably did not indicate that last year, but with the overall      situation that question is raised.   PLAN:  1. Schedule followup colonoscopy in the next couple of months.  2. Check tissue glutaminase antibody and an IGA level to look for the      possibility of sprue.  3. He can use Imodium as needed.  4. He should stop his iron if he is repleted. I told him this.  5. Given the throat clearing problems (no sore throat, no classic      heartburn, no dysphagia), I want him to try his Prevacid again. He      has that at home he says and see if that does not resolve that. If      it does, I would attribute it to reflux. It could be allergies.     Iva Boop, MD,FACG  Electronically Signed    CEG/MedQ  DD: 01/08/2007  DT: 01/08/2007  Job #: [id number]   cc:   Marne A. Milinda Antis, MD

## 2011-01-02 NOTE — Assessment & Plan Note (Signed)
[facility]                            CARDIOLOGY OFFICE NOTE   [name]                       MRN:          [id number]  DATE:12/30/2007                            DOB:          07-May-1946    PRIMARY CARE PHYSICIAN:  Dr. Celedonio Savage at [facility] in  [place].   INTERVAL HISTORY:  [name] is a very pleasant 65 year old male with  history of coronary artery disease status post bypass grafting in  September 2006.  He did have mild LV dysfunction with ejection fraction  approximately 45% of the time.  Remainder of medical history is notable  for hypertension, hyperlipidemia, alcohol abuse, diet-controlled  diabetes, colonic polyps and question of mild right renal artery  stenosis on ultrasound November 2006.   Returns today for routine follow-up.  He says he is doing very well.  He  is walking around the [facility] golf course numerous times without  any difficulties.  Says at times he even jobs.  He has not had any chest  pain or shortness of breath.  His main complaint is that he often has to  clear his throat, and this is somewhat disturbing to him.  He has not  had any fevers, chills, no cough.  No heart failure symptoms.   MEDICATIONS:  1. Lipitor 80.  2. Multivitamin.  3. Lopressor 50 b.i.d.  4. Aspirin 325 a day.  5. Altace 10 a day.   PHYSICAL EXAMINATION:  GENERAL:  He is well-appearing, ambulates around  the clinic without any respiratory difficulty.  Blood pressure is  166/88, heart rate 58, weight is 173.  HEENT:  Normal.  NECK:  Supple.  There is no JVD.  Carotid 2+ bilaterally without any  bruits.  There is no lymphadenopathy or thyromegaly.  CARDIAC:  He is bradycardic and regular with no murmurs, rubs or  gallops.  LUNGS:  Clear.  ABDOMEN:  Soft, nontender, nondistended.  No hepatosplenomegaly, no  bruits, no masses.  EXTREMITIES:  Warm with no cyanosis, clubbing or edema.  No rash.  NEUROLOGICAL:  Alert  and x3.  Cranial nerves II-XII are intact.  Moves  all four extremities without difficulty.  Affect is pleasant.   STUDIES:  EKG shows sinus bradycardia at a rate of 58, no ST-T wave  abnormalities.   ASSESSMENT/PLAN:  1. Coronary artery disease.  This is stable without any evidence of      ischemia.  Continue current therapy.  2. Hypertension.  Blood pressure is significantly elevated.  I suspect      his throat clearing is coming from his Altace.  At this point, I      will switch him over to an ARB in the combination of Exforge with      amlodipine 5 and valsartan 160.  We can also consider switching his      metoprolol over to Coreg in the future to see if this helps.  3. Hyperlipidemia. This is followed by his primary care physician.      Goal LDL is less than 70.  4. Questionable right renal  artery stenosis.  Should his blood      pressure remain elevated after medical therapy, we can consider      repeating his renal artery ultrasound.   DISPOSITION:  Return to clinic in 9 months for routine follow-up.     [name]. Bensimhon, MD  Electronically Signed    DRB/MedQ  DD: 12/30/2007  DT: 12/30/2007  Job #: [id number]

## 2011-01-05 NOTE — Op Note (Signed)
NAMERAFAL, ARCHULETA NO.:  [id number]   MEDICAL RECORD NO.:  [id number]          PATIENT TYPE:  INP   LOCATION:  2306                         FACILITY:  [facility]   PHYSICIAN:  Evelene Croon, M.D.     DATE OF BIRTH:  Aug 27, 1945   DATE OF PROCEDURE:  05/09/2005  DATE OF DISCHARGE:                                 OPERATIVE REPORT   PREOPERATIVE DIAGNOSIS:  Severe three-vessel coronary disease with non-ST-  segment-elevation myocardial infarction.   POSTOPERATIVE DIAGNOSIS:  Severe three-vessel coronary disease with non-ST-  segment-elevation myocardial infarction.   OPERATIVE PROCEDURE:  1.  Median sternotomy, extracorporeal circulation, coronary bypass graft      surgery x5 using a left internal mammary artery graft to left anterior      descending coronary artery, with a saphenous vein graft to the diagonal      branch of the left anterior descending, a saphenous vein graft to the      obtuse marginal branch of the left circumflex coronary artery, and a      sequential saphenous vein graft to the posterior descending and      posterolateral branches of the right coronary artery.  2.  Endoscopic vein harvesting from the right leg.   ATTENDING SURGEON:  Evelene Croon, M.D.   ASSISTANTS:  1.  Tressie Stalker, M.D.  2.  Coral Ceo, P.A.-C   CLINICAL HISTORY:  This patient is a 65 year old gentleman who has  previously been in good health, who was admitted with a several-day history  of recurrent substernal chest pain with mild exertion.  He had a prolonged  episode occurring shortly after he got in the shower in the morning and this  did not resolve with rest and prompted admission.  He ruled in for a non-ST-  segment-elevation MI.  Cardiac catheterization showed severe three-vessel  disease.  The LAD had a 99% stenosis at the takeoff of a diagonal branch.  There was slow flow down the LAD.  The left circumflex had an occluded large  marginal branch that filled  faintly by collaterals from the right.  The  right coronary artery was diffusely diseased with multiple areas of 60% to  70% stenosis.  The left ventricular function appeared fairly well-preserved.  There was no mitral regurgitation and no gradient across the aortic valve.  The patient had an intra-aortic balloon pump placed in the catheterization  lab due to the anatomy and ongoing chest pain.  He became pain-free.  After  review of his angiogram and examination of the patient, I felt that coronary  artery bypass graft surgery was the best treatment.  I discussed the  operative procedure with the patient and his son including alternatives,  benefits, and risks including bleeding, blood transfusion, infection,  stroke, myocardial infarction, graft failure, and death.  They understood  and agreed to proceed.   OPERATIVE PROCEDURE:  The patient was taken to the operating room and placed  on the table in a supine position.  After induction of general endotracheal  anesthesia, a Foley catheter was placed in the bladder using sterile  technique.  Then the chest, abdomen and both lower extremities were prepped  and draped in usual sterile manner.  The chest was entered through an median  sternotomy incision and pericardium opened in the midline.  Examination of  the heart showed good ventricular contractility.  The ascending aorta had no  palpable plaques in it.   Then the left internal mammary artery was harvested from the chest wall as a  pedicle graft.  This was a medium-caliber vessel with excellent blood flow  through it.  At the same time, a segment of greater saphenous vein was  harvested from the left leg using endoscopic vein harvest technique.  This  vein was of medium size and good quality.   Then the patient was heparinized and when an adequate activated clotting  time was achieved, the distal ascending aorta was cannulated using a 20-  [place] aortic cannula for arterial inflow.   Venous outflow was achieved  using a two-stage venous cannula through the right atrial appendage.  An  antegrade cardioplegia and vent cannula were inserted in the aortic root.  A  retrograde cardioplegia cannula was inserted through the right atrium into  the coronary sinus.   The patient was placed on cardiopulmonary bypass and the distal coronary  arteries identified.  The LAD was a large graftable vessel.  It was  intramyocardial in its proximal and midportions and exited in the distal  third.  The diagonal branch was a medium-sized graftable vessel.  The obtuse  marginal was intramyocardial along its entire extent, but it was located in  its midportion and was a large graftable vessel with minimal disease in it.  The right coronary was diffusely diseased.  It gave off a small posterior  descending and posterolateral branch that were suitable for grafting.  The  vessel also had several more lateral posterolateral branches that were tiny  non-graftable vessels.   Then the aorta was crossclamped and 300 mL of cold blood antegrade  cardioplegia were administered in the aortic root with a slow arrest of the  heart.  This was followed by 300 mL of cold blood retrograde cardioplegia  with quick arrest the heart.  Systemic hypothermia to 20 degrees centigrade  and topical hypothermic with iced saline was used.  A temperature probe was  placed in septum and an insulating pad in the pericardium.  With retrograde  cardioplegia, we were able to drop the myocardial temperature to about 9  degrees centigrade.   Then the first distal anastomosis was formed to the obtuse marginal branch.  The internal diameter was about 1.75 mm.  The conduit that was used was a  segment of greater saphenous vein and the anastomosis performed in an end-to-  side manner using continuous 7-0 Prolene suture.  Flow was measured through  the graft and was excellent.  The second distal anastomosis was performed to the  diagonal branch.  The  internal diameter was about 1.6 mm.  The conduit that was used was a second  segment of greater saphenous vein and the anastomosed performed in an end-to-  side manner using continuous 7-0 Prolene suture.  Flow was noted through the  graft and was excellent.  Then another dose of retrograde cardioplegia was  given.   The third distal anastomosis was performed to the posterior descending  artery.  The internal diameter was 1.5 mm.  The conduit that was used was a  third segment of greater saphenous vein and anastomosis performed in a  sequential side-to-side manner using continuous 7-0 Prolene suture.  Flow  was noted through the graft and was excellent.   The fourth distal anastomosis was performed to the posterolateral branch of  the right coronary artery.  The internal diameter was 1.5 mm.  The conduit  that was used was the same segment of greater saphenous vein and the  anastomosis performed in a sequential end-to-side manner using continuous 7-  0 Prolene suture.  Flow was noted through the graft and was excellent.  Then  another dose of cardioplegia was given down the vein grafts and in a  retrograde manner.   Then the fifth distal anastomosis was performed to the midportion of left  anterior descending coronary artery.  The internal diameter of this vessel  was about 1.75 mm.  The conduit that was used was the left internal mammary  graft and this was brought through an opening in the left pericardium  anterior to the phrenic nerve.  It was anastomosed the LAD in an end-to-side  manner using continuous 8-0 Prolene suture.  The pedicle was suture to the  epicardium with 6-0 Prolene sutures.  The patient was rewarmed to 37 degrees  centigrade.  Then with the crossclamp in place, the 3 proximal vein graft  anastomoses were performed to the aortic root in an end-to-side manner using  continuous 6-0 Prolene suture.  The clamp was then removed from the mammary   artery pedicle.  There was rapid warming of the ventricular septum and  return of spontaneous ventricular fibrillation.  The crossclamp was removed  with a time of 86 minutes and the patient was defibrillated into sinus  rhythm.   The proximal and distal anastomoses appeared hemostatic and the lines of the  graft satisfactory.  Graft markers were placed around the proximal  anastomoses.  Two temporary right ventricular and right atrial pacing wires  were placed and brought out through the skin.   When the patient had rewarmed 37 degrees centigrade, he was weaned from  cardiopulmonary bypass on no inotropic agents.  Total bypass time was 109  minutes.  Cardiac function appeared excellent with a cardiac output of 8 L  per minute.  Protamine was given and venous and aortic cannulas were removed  without difficulty.  Hemostasis was achieved.  Three chest tubes were placed with 2 in the posterior pericardium, 1 in the left pleural space, and 1 in  the anterior mediastinum.  The pericardium was loosely reapproximated over  the heart.  The sternum was closed with #6 stainless steel wires.  The  fascia was closed with a continuous #1 Vicryl suture.  The subcutaneous  tissue was closed with a continuous 2-0 Vicryl and the skin with 3-0 Vicryl  subcuticular closure.  The lower extremity vein harvest site was closed in  layers in a similar manner.  Sponge, needles and instrument counts were  correct according to the scrub nurse.  Dry sterile dressings were applied  over the incisions and around the chest tubes, which were hooked to Pleur-  evac suction.  The patient remained hemodynamically stable and was  transported to the SICU in stable condition.      Evelene Croon, M.D.  Electronically Signed     BB/MEDQ  D:  05/09/2005  T:  05/10/2005  Job:  [id number]   cc:   [facility]   [facility] Cardiac Cath Lab

## 2011-01-05 NOTE — Assessment & Plan Note (Signed)
[facility]                           GASTROENTEROLOGY OFFICE NOTE   [name], [name]                       MRN:          [id number]  DATE:03/22/2006                            DOB:          Jan 05, 1946    GASTROENTEROLOGY FOLLOW-UP NOTE:  Please see my medical history and physical for full details.   ASSESSMENT:  Iron-deficiency anemia of unclear cause.  He has had some  recent melena.  Upper gastrointestinal endoscopy demonstrated a hyperplastic  gastric polyp, which was removed.  This certainly could have bled when he  was on aspirin.  He had some scalloped folds showing most likely peptic  duodenitis, biopsied.  I have explained this to him today.  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  I plan for a repeat colonoscopy in one year.  There was no  cancer.   RECOMMENDATIONS AND PLAN:  I think at this point he can go back on his 81 mg  of aspirin a day.  I am going to have him stay on his iron, and we will  follow up with a CBC today.  I have not ordered, but we could consider, a  sprue panel or a tissue transglutaminase antibody.  The appearance of the  intestine did not look like classical sprue.  We have considered the  possibility of a small bowel capsule endoscopy and decided to hold off.  He  can discontinue the Prevacid, which was given prophylactically after I  snared the polyp from his stomach.  He will complete Hemoccults later in  August.  If they are positive, a small bowel capsule endoscopy will be  performed.   His blood pressure has been elevated lately at home, it sounds like  systolics in the 150 range or more.  Here today it is 162/90 and pulse of  94, weight 168 pounds.  He is going to follow up with Dr. Milinda Antis about this,  as he has recorded blood pressures at home and he may need some adjustment  of his medications.                                   Iva Boop, MD, [facility]   CEG/MedQ  DD:  03/22/2006  DT:  03/23/2006  Job #:  [id number]   cc:   Marne A. Milinda Antis, MD

## 2011-01-05 NOTE — Cardiovascular Report (Signed)
NAMEABDELAZIZ, WESTENBERGER NO.:  [id number]   MEDICAL RECORD NO.:  [id number]          PATIENT TYPE:  INP   LOCATION:  2997                         FACILITY:  [facility]   PHYSICIAN:  Arvilla Meres, M.D. [facility] OF BIRTH:  Dec 25, 1945   DATE OF PROCEDURE:  05/07/2005  DATE OF DISCHARGE:                              CARDIAC CATHETERIZATION   PRIMARY CARE PHYSICIAN:  Dr. Roxy Manns.   REFERRING PHYSICIAN:  Dr. Cleta Alberts at the [facility].   CARDIOLOGIST:  Arvilla Meres, M.D.   PATIENT IDENTIFICATION:  [name] is a very pleasant 65 year old male with  a history of hyperlipidemia, hypertension and alcohol use, but no known  history of coronary artery disease, who was admitted with a non-ST-elevation  MI.  He was brought to the catheterization lab to define his coronary  anatomy.   PROCEDURES PERFORMED:  1.  Selective coronary angiography.  2.  Left heart catheterization.  3.  Left ventriculogram.  4.  Abdominal aortogram.  5.  Left subclavian angiography.  6.  Intra-aortic balloon pump insertion.   DESCRIPTION OF PROCEDURE:  The risks and benefits of catheterization were  explained to Mr. Hayashi had his son; consent was signed and placed on the  chart.  A 6-French arterial sheath was placed in the right femoral artery  using a modified Seldinger technique.  Standard catheters including JL4, JR4  and an angled pigtail were used for the catheterization.  All catheter  exchanges were made over a wire.  There were no apparent complications.  At  the end of the procedure, the 6-French arterial sheath was switched out for  a 9-French sheath and an intra-aortic balloon pump was placed under  fluoroscopic guidance.   FINDINGS:  Central aortic pressure is 128/82 with a mean of 103.  LV  pressure is 139/0 with an EDP of 20.  There was no aortic stenosis on  pullback.   CORONARY ANATOMY:  Left main:  Was normal.   LAD was a long vessel which bifurcated at the  apex.  It gave off a very  large first diagonal as well as a large septal perforator.  There was a 99%  stenosis in the proximal LAD just prior to the takeoff of the large first  diagonal.  There was TIMI- 2 flow throughout the rest of the vessel.  There  was a 30% lesion in the midsection of the LAD followed by 50% lesion in the  distal vessel after the bifurcation.   Left circumflex was a moderate-sized vessel.  It gave off a tiny ramus, a  large OM-1 and a small OM-2.  The AV groove circumflex was tiny.  In the mid-  circumflex, there was a 60% tubular stenosis prior to the takeoff of the OM-  1 and then a 99% stenosis in circumflex into the ostium of the OM-2.  In the  ramus, there a was subtotaled proximally, but this was a small vessel.  In  the OM-1, this was totaled proximally.  It appeared to be [place] a small vessel;  however, in looking on the shot of  the right coronary artery, there were  right-to-left collaterals which supplied a large OM-1 after the total  occlusion.  The were multiple branches.   OM-2 was tiny with a 99% ostial lesion extending from the left circumflex.   The right coronary artery was a large dominant vessel; it was diffusely  diseased throughout.  It gave off a large acute marginal which fed the PDA  territory.  There was formal PDA and 3 PLs.  There was a 60% tubular  stenosis in the proximal portion of the RCA followed by a 50% tubular the  mid-section.  After acute marginal, there was a 40% tubular stenosis  followed by 60% stenosis prior to the takeoff of the PDA.  In the first  posterolateral, there was a 50% ostial lesion.  The second posterolateral  was totaled proximally, but filled through right-to-right collaterals from  the third posterolateral.  There was evidence of right-to-left collaterals  filling a branching OM-1.   Left ventriculogram done in the RAO approach showed an EF of 40% to 45% with  severe anterior apical and inferior apical  hypokinesis.  There was no  significant mitral regurgitation.   Abdominal aortogram showed a 25% stenosis in the right renal artery.  The  left renal artery was widely patent.  There was no significant aortoiliac  disease.  Of note, on evaluating the kidneys, the left kidney was normal  size; however, the right kidney was mildly atrophic.   ASSESSMENT:  1.  Severe three-vessel coronary artery disease with 99% proximal left      anterior descending lesion with TIMI-2 flow distally.  2.  Mild decreased left ventricular function with anterior apical and      inferior apical severe hypokinesis.  3.  Patient left internal mammary artery to chest wall.  4.  Mildly atrophic right kidney without significant renal artery stenosis.   </PLAN/DISCUSSION>  I have reviewed the films with Dr. Charlies Constable.  Given the diffuse nature  of his disease, we will contact CVTS for possible bypass surgery.  In the  interim, we will support him with an intra-aortic balloon pump with heparin,  Integrilin and nitroglycerin.  Post catheterization, he will need a renal  ultrasound to further evaluate his right kidney.      Arvilla Meres, M.D. [facility]  Electronically Signed     DB/MEDQ  D:  05/07/2005  T:  05/08/2005  Job:  [id number]   cc:   Marne A. Tower, M.D. [facility]  [address] [place]  [place] [address]   Stan Head. Cleta Alberts, M.D.  Fax: [phone]

## 2011-01-05 NOTE — H&P (Signed)
NAMEWALLY, SHEVCHENKO NO.:  [id number]   MEDICAL RECORD NO.:  [id number]          PATIENT TYPE:  INP   LOCATION:  2101                         FACILITY:  [facility]   PHYSICIAN:  Arvilla Meres, M.D. [facility] OF BIRTH:  1945-08-31   DATE OF ADMISSION:  05/06/2005  DATE OF DISCHARGE:                                HISTORY & PHYSICAL   PRIMARY CARE PHYSICIAN:  Dr. Roxy Manns   CARDIOLOGY:  He is new to [facility].   REFERRING PHYSICIAN:  Dr. Cleta Alberts at the acute care center in [place].   PATIENT IDENTIFICATION:  Mr. Saxer is a very pleasant 65 year old male with  a history of hypertension and hyperlipidemia but no known coronary artery  disease who is admitted today from acute care clinic for unstable angina.   HISTORY OF PRESENT ILLNESS:  As above. Mr. Sydnor denies any history of known  coronary disease. He has never had a cardiac catheterization or a stress  test. He reports a 3- to 84-month history of mild exertional angina. However,  the past several days this has followed a crescendo pattern. He said  yesterday while attending the [employer] game, he had some mild chest pain  upon walking about a mile to the stadium. This resolved on sitting down and  did not recur. This morning, he decided that he was out of shape and was  going to exercise a bit. However, after walking a quarter mile he had  recurrent chest pain with radiation to his neck. The pain then recurred two  more times throughout the day. The last episode was much more severe,  occurred while bowling, and was associated with radiation to the jaw and  diaphoresis. There was no nausea or vomiting. He eventually went to urgent  care center where he continued to have chest pain. EKG shows 2-3 mm of ST  depression across the anterolateral leads. He was given aspirin and  transported by ambulance to the emergency room. In the emergency room he was  started on IV nitroglycerin and IV heparin and  his pain has essentially  resolved.   REVIEW OF SYSTEMS:  He denies any recent fevers or chills. He has had a  mildly productive cough with clear phlegm. He denies any orthopnea, PND, or  lower extremity edema. He denies any palpitations, presyncope, or syncope.  He has not had any bleeding. There has been no melena, bright red blood per  rectum, or hematemesis. He denies any claudication. There are no neurologic  symptoms. The rest of the review of systems is negative except as listed in  the HPI and problem list.   PAST MEDICAL HISTORY:  1.  Hypertension.  2.  Hyperlipidemia.  3.  History of alcohol use with two large margaritas per day.  4.  History of tobacco use, 1.5 packs per day x15 years, quit 28 years ago.   SURGICAL HISTORY:  History of tonsillectomy. No other surgeries.   CURRENT MEDICATIONS:  1.  Norvasc 10 mg a day.  2.  Lipitor 5 mg a day.   MEDICATION ALLERGIES:  No known  drug allergies.   SOCIAL HISTORY:  He lives in [place] with his wife. He has one son. He  works as a Investment banker, corporate. He has a history of tobacco use as above, but  has been quite for 28 years. Ongoing alcohol use. He denies illicit drugs.   FAMILY HISTORY:  Negative for premature coronary artery disease. His father  died in his mid 82s and he did have some heart disease, but not before the  age of 34. Mother died in her mid 84s as well due to severe COPD. He has one  sister who he does not know anything about.   PHYSICAL EXAMINATION:  GENERAL:  He is comfortable, no acute distress.  Respirations are unlabored.  VITAL SIGNS:  He is afebrile, blood pressure is 134/75, with a heart rate of  70, saturating in the high 90s on 2 L of nasal cannula.  HEENT:  Sclerae anicteric, EOMI. There are no xanthelasmas. Mucous membranes  are moist.  NECK:  Supple. There is no JVD. Carotids are 2+ bilaterally without any  bruits. There is no lymphadenopathy or thyromegaly.  CARDIAC:  He has a regular rate  and rhythm with normal S1 and S2. His PMI is  nondisplaced. There are no murmurs, rubs, or gallops.  LUNGS:  Clear to auscultation.  ABDOMEN:  Soft, nontender, nondistended. There is no hepatosplenomegaly.  There are no bruits, there are no masses. He has good bowel sounds.  EXTREMITIES:  Warm with no cyanosis, clubbing, or edema. Femoral pulses are  2+ bilaterally without any bruits. DP pulses are 2+ bilaterally.  NEUROLOGIC:  He is alert and oriented x3 with a bright affect. Cranial  nerves II-XII are intact. He moves all four extremities without difficulty.   LABORATORY DATA:  Labs are pending. Chest x-ray is pending.   EKG shows normal sinus rhythm at a rate of 79 with 1-3 mm of ST depression  in V2 through V6, as well as lead I and II. There is 1 mm of ST elevation in  V1.   ASSESSMENT AND PLAN:  Mr. Cromartie is a very pleasant 65 year old man as above  who presents with unstable angina. He will obvious need a cardiac  catheterization in the morning. For now, will admit him to stepdown and  cycle his cardiac markers. He will be treated with aspirin, IV  nitroglycerin, heparin, Integrilin, beta blockers, and a statin. We will  hold Plavix, given the possibility of surgical disease.   Although he denies history of alcohol withdrawal syndrome, given his  consistent and fairly significant alcohol use we will use DT prophylaxis  with him with Librium, as well as treat him with thiamine and multivitamin.   I explained the risks of catheterization to both him and his son in detail  and they have agreed to proceed. Consent will be signed and placed on the  chart.      Arvilla Meres, M.D. [facility]  Electronically Signed     DB/MEDQ  D:  05/06/2005  T:  05/07/2005  Job:  [id number]   cc:   Marne A. Tower, M.D. [facility]  [address], [place]  [place] [address]   Stan Head. Cleta Alberts, M.D.  Fax: [phone]

## 2011-01-05 NOTE — Discharge Summary (Signed)
[name], [name] NO.:  [id number]   MEDICAL RECORD NO.:  [id number]          PATIENT TYPE:  INP   LOCATION:  2007                         FACILITY:  [facility]   PHYSICIAN:  Evelene Croon, M.D.     DATE OF BIRTH:  Jul 03, 1946   DATE OF ADMISSION:  05/06/2005  DATE OF DISCHARGE:                                 DISCHARGE SUMMARY   ADMISSION DIAGNOSIS:  Chest pain.   PAST MEDICAL HISTORY AND DISCHARGE DIAGNOSES:  1.  Hypertension.  2.  Hyperlipidemia.  3.  Alcohol abuse.  4.  Tobacco abuse.  5.  Three-vessel coronary artery disease, status post coronary artery bypass      grafting x5.  6.  Postoperative anemia, status post transfusion.  7.  Newly-diagnosed diabetes mellitus type 2, with hemoglobin A1c of 7.1.  8.  Postoperative right-sided pneumothorax, persistent and stable.  9.  History of tonsillectomy.   ALLERGIES:  No known drug allergies.   BRIEF HISTORY:  The patient is a 65 year old Caucasian male with a history  of hypertension and hyperlipidemia but no known history of cardiac disease.  The patient reported a three to four-month history of mild exertional  angina; however, the several days prior to admission it followed a crescendo  pattern.  On the morning of admission, he walked approximately a quarter of  a mile and had recurrent chest pain with radiation to the neck.  This pain  recurred several more times throughout the day and was associated with  radiation to the jaw and diaphoresis.  The patient eventually went to an  urgent care center, where he continued to have chest pain and an EKG showed  ST segment depression across the anterolateral leads.  The patient was given  aspirin and transferred by ambulance to the emergency room.  In the ER he  was started on IV nitroglycerin, heparin, and the pain essentially resolved.  He was subsequently evaluated by Dr. Gala Romney of the [facility] cardiology  service, where he was admitted with unstable angina  and set up for a cardiac  catheterization.  Secondary to the patient's history of alcohol abuse, he  was started on Librium as well as thiamine and a multivitamin.   HOSPITAL COURSE:  The patient was admitted via the emergency room on  May 06, 2005, with chest pain and EKG revealing ST segment depression  across the anterolateral leads as previously stated.  Dr. Gala Romney  evaluated the patient and admitted him with unstable angina.  He was  subsequently ruled in for a non-ST segment myocardial infarction.  The  patient was taken to cardiac catheterization on May 07, 2005, and this  revealed three-vessel coronary artery disease.  There was a mild decrease in  the left ventricular function with anteroapical and inferior apical severe  hypokinesis.  Secondary to these findings, Evelene Croon, M.D., of [facility] was  consulted on May 08, 2005.  It was his opinion that the patient should  receive a coronary artery bypass graft surgery.   The patient was maintained on routine hospital care and then taken to the OR  on May 09, 2005, for coronary artery bypass grafting x5.  Endoscopic  vein harvesting was performed on the left lower extremity.  The left  internal mammary artery was grafted to the LAD, a saphenous vein was grafted  in sequence to the PD and PL, a saphenous vein was grafted to the OM, and a  saphenous vein was grafted to the diagonal.  The patient tolerated the  procedure well, was hemodynamically stable immediately postoperatively.  The  patient was transferred from the OR to the SICU in stable condition.  The  patient was extubated without complication, woke up from anesthesia  neurologically intact.   In the postoperative course, the patient was noted to have elevated CBGs and  was diagnosed with diabetes mellitus type 2.  He was controlled on Lantus  and sliding scale.  He was also started on Amaryl in the postoperative  course, and his CBGs have been  well-controlled.  On postoperative day 1, the  patient was afebrile with stable vital signs.  He was maintaining a sinus  tachycardia with a rate in the low 100s.  On physical exam he was also  stable.  The patient was noted to have a postoperative anemia with a  hemoglobin of 7.8 and a hematocrit of 22.  He was therefore transfused with  one unit of packed RBCs.  This improved and has remained stable throughout  the remainder of the postoperative course.  All invasive lines and chest  tubes were discontinued in a routine manner without difficulty.  All drips  were weaned accordingly.  The patient had some volume overload  postoperatively and has been diuresed accordingly.   The patient began cardiac rehab on postoperative day 1 and has continued to  increase his tolerance to a satisfactory level.   On postoperative day 2, the patient was noted to have a 10-15% right  pneumothorax.  He is asymptomatic from this, and it has remained stable  throughout the remainder of the postoperative course without change.  This  will be followed up closely with a chest x-ray after discharge.   PHYSICAL EXAMINATION:  GENERAL:  On postoperative day 4, the patient was  without complaint.  He is afebrile with stable vital signs and maintaining a  normal sinus rhythm.  Chest x-ray reveals the stable right pneumothorax.  CARDIAC:  On physical exam, cardiac is regular rate and rhythm.  CHEST:  Lungs reveal decreased breath sounds in the right apex.  ABDOMEN:  Benign.  SKIN:  The incisions are healing well.  EXTREMITIES:  There is no edema present in the bilateral lower extremities.   The patient is in stable condition at this time.  As long as he continues to  progress in the current manner, he will be ready for discharge within the  next one to two days pending morning round reevaluation.   LABORATORY DATA:  CBC and BMP on May 11, 2005:  White count 11.1, hemoglobin 10.7, hematocrit 30.5, platelets  154.  Sodium 137, potassium 3.5,  BUN 13, creatinine 1.2, glucose 122.   CONDITION ON DISCHARGE:  Improved.   DISCHARGE INSTRUCTIONS:   DISCHARGE MEDICATIONS:  1.  Aspirin 325 mg daily.  2.  Lopressor 50 mg b.i.d.  3.  Altace 2.5 mg daily.  4.  Lipitor 80 mg daily.  5.  Amaryl 2 mg daily.  6.  Multivitamin daily.  7.  Tylox one to two q.4-6h. p.r.n. pain.   ACTIVITY:  No driving, no lifting of more than  10 pounds, and the patient  should continue daily breathing and walking exercises.  He is to refrain  from smoking.   DIET:  Low-salt, low-fat, and carbohydrate-modified, medium-calorie.   WOUND CARE:  The patient may shower daily and clean the incisions with soap  and water.  If wound problems arise, he should contact the [facility] office at  [phone].   Follow-up appointment with [facility] on May 30, 2005, at 11:45  a.m.  A chest x-ray will be taken at that time.  Next, with Dr. Laneta Simmers on  June 12, 2005, at 11:30 a.m.      Pecola Leisure, [place]      Evelene Croon, M.D.  Electronically Signed    AY/MEDQ  D:  05/13/2005  T:  05/14/2005  Job:  [id number]   cc:   [facility]

## 2011-03-15 ENCOUNTER — Other Ambulatory Visit: Payer: Self-pay | Admitting: *Deleted

## 2011-03-15 MED ORDER — ATORVASTATIN CALCIUM 80 MG PO TABS: 80.0000 mg | ORAL_TABLET | Freq: Every day | ORAL | Status: DC

## 2011-08-01 ENCOUNTER — Encounter: Payer: Self-pay | Admitting: Family Medicine

## 2011-08-02 ENCOUNTER — Encounter: Payer: Self-pay | Admitting: Family Medicine

## 2011-08-02 ENCOUNTER — Ambulatory Visit (INDEPENDENT_AMBULATORY_CARE_PROVIDER_SITE_OTHER): Payer: Medicare Other | Admitting: Family Medicine

## 2011-08-02 VITALS — BP 150/80 | HR 68 | Temp 98.3°F | Ht 68.0 in | Wt 183.8 lb

## 2011-08-02 DIAGNOSIS — K148 Other diseases of tongue: Secondary | ICD-10-CM

## 2011-08-02 DIAGNOSIS — Z23 Encounter for immunization: Secondary | ICD-10-CM

## 2011-08-02 MED ORDER — AMOXICILLIN-POT CLAVULANATE 875-125 MG PO TABS: 1.0000 | ORAL_TABLET | Freq: Two times a day (BID) | ORAL | Status: AC

## 2011-08-02 NOTE — Progress Notes (Signed)
  Subjective:    Patient ID: [name], male    DOB: August 02, 1946, 65 y.o.   MRN: [id number]  HPI CC: tongue lesion  Bit tongue months ago.  Has continued to bite, but not recently.  Staying tender.  Lingering.  Thinks receding but very slowly.  No h/o oral lesions.  bp elevated.  Doesn't check at home.  Advised to start checking.  No smoking, dip, chewing.  Remote smoking.  Review of Systems Per HPI    Objective:   Physical Exam  Nursing note and vitals reviewed. Constitutional: He appears well-developed and well-nourished. No distress.  HENT:  Mouth/Throat: Uvula is midline, oropharynx is clear and moist and mucous membranes are normal. Normal dentition.         On left lateral posterior tongue 0.5cm lesion, tongue colored, indurated with minimal central ulceration  Lymphadenopathy:    He has no cervical adenopathy.      Assessment & Plan:

## 2011-08-02 NOTE — Assessment & Plan Note (Signed)
Several month h/o tongue lesion.  Exam consistent with granuloma. Will treat with augmentin x 10 days, to see if can speed recovery.   rtc 1 mo. If not improved, refer to ENT for further eval, biopsy.

## 2011-08-02 NOTE — Patient Instructions (Signed)
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 Treat with short course of antibiotic to see if we can speed recovery. Return in 1 month for recheck.  If not improving, will refer you to oral surgeon or ENT doctor for recheck. Let us know sooner if worsening.

## 2011-09-04 ENCOUNTER — Ambulatory Visit (INDEPENDENT_AMBULATORY_CARE_PROVIDER_SITE_OTHER): Payer: Medicare Other | Admitting: Family Medicine

## 2011-09-04 ENCOUNTER — Encounter: Payer: Self-pay | Admitting: Family Medicine

## 2011-09-04 DIAGNOSIS — K148 Other diseases of tongue: Secondary | ICD-10-CM

## 2011-09-04 DIAGNOSIS — I10 Essential (primary) hypertension: Secondary | ICD-10-CM | POA: Insufficient documentation

## 2011-09-04 DIAGNOSIS — F1010 Alcohol abuse, uncomplicated: Secondary | ICD-10-CM

## 2011-09-04 NOTE — Assessment & Plan Note (Signed)
Previously thought granuloma, treated with augmentin course, resolving nicely.  Advised to let us know if returning.

## 2011-09-04 NOTE — Patient Instructions (Signed)
I'm glad tongue lesions has improved so much, that is very reassuring. Keep a log of blood pressures and return in next 1-2 months for physical with Dr. Patsy Lager. Watch alcohol intake at night time. Have him check tongue lesion. Good to see you today, call us with questions.

## 2011-09-04 NOTE — Assessment & Plan Note (Signed)
Discussed EtOH intake and reasons to cut back.

## 2011-09-04 NOTE — Progress Notes (Signed)
  Subjective:    Patient ID: [name], male    DOB: March 18, 1946, 66 y.o.   MRN: [id number]  HPI CC: f/u left tongue lesion  Seen last month with concern for tongue lesion.  Consistent with granuloma from repetitive trauma, placed on short course of abx.  Actually almost completely resolved since then.  Very reassured.  Nonsmoker.    HTN - bp elevated again today.  Reports compliance with meds.  (exforge and metoprolol 50mg  bid)  Has not been checking at home.  Attributes to etoh intake.  Had 3 glasses of wine last night.    4-5 evenings/week has 4-6 ounces tequila or red wine.  Somewhat worried about this EtOH intake.  Strong fmhx alcoholism.  Does feel better when wakes up in am without EtOH night before.  asks about ways to help decrease EtOH intake.  Doesn't think has problem per se with EtOH.  Review of Systems Per HPI    Objective:   Physical Exam  Nursing note and vitals reviewed. Constitutional: He appears well-developed and well-nourished. No distress.  HENT:  Head: Normocephalic and atraumatic.  Nose: Nose normal. No mucosal edema or rhinorrhea.  Mouth/Throat: Uvula is midline, oropharynx is clear and moist and mucous membranes are normal. No oropharyngeal exudate, posterior oropharyngeal edema, posterior oropharyngeal erythema or tonsillar abscesses.       Resolving left sided lesion, only slight induration as residual of granuloma.  Eyes: Conjunctivae and EOM are normal. Pupils are equal, round, and reactive to light. No scleral icterus.  Neck: Normal range of motion. Neck supple. Carotid bruit is not present.  Cardiovascular: Normal rate, regular rhythm, normal heart sounds and intact distal pulses.   No murmur heard. Pulmonary/Chest: Breath sounds normal. No respiratory distress. He has no wheezes. He has no rales.  Abdominal: Soft. There is no tenderness.       No abd/renal bruits  Lymphadenopathy:    He has no cervical adenopathy.  Skin: Skin is warm and dry. No  rash noted.       Assessment & Plan:

## 2011-09-04 NOTE — Assessment & Plan Note (Signed)
Chronic. Mildly elevated today. Advised to keep log and bring to PCP at CPE for check.  Due for CPE.  rec schedule in next 1-2 mo.

## 2011-09-10 ENCOUNTER — Other Ambulatory Visit: Payer: Self-pay | Admitting: *Deleted

## 2011-09-10 MED ORDER — METOPROLOL TARTRATE 50 MG PO TABS: 50.0000 mg | ORAL_TABLET | Freq: Two times a day (BID) | ORAL | Status: DC

## 2011-09-11 ENCOUNTER — Other Ambulatory Visit: Payer: Self-pay | Admitting: *Deleted

## 2011-09-11 MED ORDER — AMLODIPINE BESYLATE-VALSARTAN 5-160 MG PO TABS: 1.0000 | ORAL_TABLET | Freq: Every day | ORAL | Status: DC

## 2011-10-05 ENCOUNTER — Other Ambulatory Visit: Payer: Self-pay | Admitting: Family Medicine

## 2011-10-05 DIAGNOSIS — Z79899 Other long term (current) drug therapy: Secondary | ICD-10-CM

## 2011-10-05 DIAGNOSIS — E785 Hyperlipidemia, unspecified: Secondary | ICD-10-CM

## 2011-10-05 DIAGNOSIS — Z125 Encounter for screening for malignant neoplasm of prostate: Secondary | ICD-10-CM

## 2011-10-05 DIAGNOSIS — E119 Type 2 diabetes mellitus without complications: Secondary | ICD-10-CM

## 2011-10-08 ENCOUNTER — Other Ambulatory Visit (INDEPENDENT_AMBULATORY_CARE_PROVIDER_SITE_OTHER): Payer: Medicare Other

## 2011-10-08 DIAGNOSIS — E119 Type 2 diabetes mellitus without complications: Secondary | ICD-10-CM

## 2011-10-08 DIAGNOSIS — Z79899 Other long term (current) drug therapy: Secondary | ICD-10-CM

## 2011-10-08 DIAGNOSIS — E785 Hyperlipidemia, unspecified: Secondary | ICD-10-CM

## 2011-10-08 DIAGNOSIS — Z125 Encounter for screening for malignant neoplasm of prostate: Secondary | ICD-10-CM

## 2011-10-08 LAB — CBC WITH DIFFERENTIAL/PLATELET
Basophils Relative: 0.8 % (ref 0.0–3.0)
Eosinophils Relative: 5.9 % — ABNORMAL HIGH (ref 0.0–5.0)
Lymphocytes Relative: 31.7 % (ref 12.0–46.0)
Neutrophils Relative %: 51.2 % (ref 43.0–77.0)
RBC: 5.31 Mil/uL (ref 4.22–5.81)
WBC: 4.7 10*3/uL (ref 4.5–10.5)

## 2011-10-08 LAB — HEPATIC FUNCTION PANEL
ALT: 56 U/L — ABNORMAL HIGH (ref 0–53)
Total Bilirubin: 0.9 mg/dL (ref 0.3–1.2)
Total Protein: 7.1 g/dL (ref 6.0–8.3)

## 2011-10-08 LAB — MICROALBUMIN / CREATININE URINE RATIO
Creatinine,U: 78.3 mg/dL
Microalb, Ur: 0.6 mg/dL (ref 0.0–1.9)

## 2011-10-08 LAB — LIPID PANEL
LDL Cholesterol: 73 mg/dL (ref 0–99)
VLDL: 38 mg/dL (ref 0.0–40.0)

## 2011-10-08 LAB — BASIC METABOLIC PANEL
BUN: 12 mg/dL (ref 6–23)
CO2: 31 mEq/L (ref 19–32)
Chloride: 102 mEq/L (ref 96–112)
Glucose, Bld: 190 mg/dL — ABNORMAL HIGH (ref 70–99)
Potassium: 4.4 mEq/L (ref 3.5–5.1)

## 2011-10-08 LAB — HEMOGLOBIN A1C: Hgb A1c MFr Bld: 9.1 % — ABNORMAL HIGH (ref 4.6–6.5)

## 2011-10-11 ENCOUNTER — Ambulatory Visit (INDEPENDENT_AMBULATORY_CARE_PROVIDER_SITE_OTHER): Payer: Medicare Other | Admitting: Family Medicine

## 2011-10-11 ENCOUNTER — Encounter: Payer: Self-pay | Admitting: Family Medicine

## 2011-10-11 VITALS — BP 150/90 | HR 58 | Temp 97.7°F | Ht 68.0 in | Wt 182.8 lb

## 2011-10-11 DIAGNOSIS — Z23 Encounter for immunization: Secondary | ICD-10-CM

## 2011-10-11 DIAGNOSIS — E785 Hyperlipidemia, unspecified: Secondary | ICD-10-CM

## 2011-10-11 DIAGNOSIS — I10 Essential (primary) hypertension: Secondary | ICD-10-CM

## 2011-10-11 DIAGNOSIS — Z0000 Encounter for general adult medical examination without abnormal findings: Secondary | ICD-10-CM

## 2011-10-11 DIAGNOSIS — I25810 Atherosclerosis of coronary artery bypass graft(s) without angina pectoris: Secondary | ICD-10-CM | POA: Insufficient documentation

## 2011-10-11 DIAGNOSIS — E1165 Type 2 diabetes mellitus with hyperglycemia: Secondary | ICD-10-CM

## 2011-10-11 MED ORDER — METFORMIN HCL ER 500 MG PO TB24: 1000.0000 mg | ORAL_TABLET | Freq: Every day | ORAL | Status: DC

## 2011-10-11 MED ORDER — AMLODIPINE BESYLATE-VALSARTAN 10-160 MG PO TABS: 1.0000 | ORAL_TABLET | Freq: Every day | ORAL | Status: DC

## 2011-10-11 NOTE — Progress Notes (Signed)
Patient Name: [name] Date of Birth: [birth year] Age: 66 y.o. Medical Record Number: [id number] Gender: male Date of Encounter: 10/11/2011  History of Present Illness:  [name] is a 66 y.o. very pleasant male patient who presents with the following:  Medicare wellness and f/u medical problems:  Preventative Health Maintenance Visit:  Health Maintenance Summary Reviewed and updated, unless pt declines services.  Tobacco History Reviewed. Alcohol: 1-2 drinks a night Exercise Habits: Some activity, rec at least 30 mins 5 times a week STD concerns: no risk or activity to increase risk Drug Use: None Encouraged self-testicular check  Health Maintenance  Topic Date Due  . Zostavax  05/20/2006  . Pneumococcal Polysaccharide Vaccine Age 60 And Over  05/21/2011  . Influenza Vaccine  05/20/2012  . Tetanus/tdap  04/13/2019  . Colonoscopy  06/11/2019   Labs reviewed with the patient.   Lipids:    Component Value Date/Time   CHOL 143 10/08/2011 0914   TRIG 190.0* 10/08/2011 0914   HDL 32.50* 10/08/2011 0914   VLDL 38.0 10/08/2011 0914   CHOLHDL 4 10/08/2011 0914    CBC:    Component Value Date/Time   WBC 4.7 10/08/2011 0914   HGB 16.6 10/08/2011 0914   HCT 48.4 10/08/2011 0914   PLT 176.0 10/08/2011 0914   MCV 91.1 10/08/2011 0914   NEUTROABS 2.4 10/08/2011 0914   LYMPHSABS 1.5 10/08/2011 0914   MONOABS 0.5 10/08/2011 0914   EOSABS 0.3 10/08/2011 0914   BASOSABS 0.0 10/08/2011 0914    Basic Metabolic Panel:    Component Value Date/Time   NA 139 10/08/2011 0914   K 4.4 10/08/2011 0914   CL 102 10/08/2011 0914   CO2 31 10/08/2011 0914   BUN 12 10/08/2011 0914   CREATININE 1.2 10/08/2011 0914   GLUCOSE 190* 10/08/2011 0914   CALCIUM 9.8 10/08/2011 0914    Lab Results  Component Value Date   ALT 56* 10/08/2011   AST 31 10/08/2011   ALKPHOS 79 10/08/2011   BILITOT 0.9 10/08/2011    Lab Results  Component Value Date   PSA 1.08 10/08/2011   PSA 0.92 04/13/2009      Pneumovax zostavax  Cut back on his alcohol.   HTN: Tolerating all medications without side effects His blood pressure has been persistently elevated, and he brings in his blood pressure readings at home. He has had some they have been in the 160s range, over 90s. No CP, no sob. No HA.  BP Readings from Last 3 Encounters:  10/11/11 150/90  09/04/11 146/90  08/02/11 150/80    Basic Metabolic Panel:    Component Value Date/Time   NA 139 10/08/2011 0914   K 4.4 10/08/2011 0914   CL 102 10/08/2011 0914   CO2 31 10/08/2011 0914   BUN 12 10/08/2011 0914   CREATININE 1.2 10/08/2011 0914   GLUCOSE 190* 10/08/2011 0914   CALCIUM 9.8 10/08/2011 0914   Diabetes Mellitus: Tolerating Medications: n/a Compliance with diet: no Exercise: rare Avg blood sugars at home: not checking Foot problems: none Hypoglycemia: none No nausea, vomitting, blurred vision, polyuria.  Lab Results  Component Value Date   HGBA1C 9.1* 10/08/2011    Wt Readings from Last 3 Encounters:  10/11/11 182 lb 12.8 oz (82.918 kg)  09/04/11 186 lb 4 oz (84.482 kg)  08/02/11 183 lb 12 oz (83.348 kg)    Body mass index is 27.79 kg/(m^2).  Lipids: Doing well, stable. Tolerating meds fine with no SE. Panel reviewed with  patient.  Lipids:    Component Value Date/Time   CHOL 143 10/08/2011 0914   TRIG 190.0* 10/08/2011 0914   HDL 32.50* 10/08/2011 0914   VLDL 38.0 10/08/2011 0914   CHOLHDL 4 10/08/2011 0914    Lab Results  Component Value Date   ALT 56* 10/08/2011   AST 31 10/08/2011   ALKPHOS 79 10/08/2011   BILITOT 0.9 10/08/2011   Clears his throat a lot.   Left nipple itches  Patient Active Problem List  Diagnoses  . Diabetes type 2, uncontrolled  . HYPERLIPIDEMIA  . DEPRESSION  . OTHER MALAISE AND FATIGUE  . HX, PERSONAL, COLONIC POLYPS  . ALCOHOL ABUSE  . Tongue lesion  . HTN (hypertension)   Past Medical History  Diagnosis Date  . HTN (hypertension) 10/03  . HLD (hyperlipidemia) 10/03  . DMII  (diabetes mellitus, type 2) 10/03  . Depression   . CAD (coronary artery disease) 9/06    CABG x4  . Colon polyps   . Alcohol abuse, unspecified    Past Surgical History  Procedure Date  . Coronary artery bypass graft 9/06    x4  . Tonsillectomy   . Esophagogastroduodenoscopy 7/07    polyp; mucosal abnml  . Colonoscopy     Hem. Polyp; Hi grade dysplasia  . Colonoscopy 7/08    polyps   History  Substance Use Topics  . Smoking status: Former Games developer  . Smokeless tobacco: Not on file  . Alcohol Use: Yes     Regular   Family History  Problem Relation Age of Onset  . Alcohol abuse Father   . Stroke Father   . Other Father     cardiac problems  . COPD Mother   . Alcohol abuse Brother   . Cirrhosis Brother     EtOH  . Anxiety disorder Brother    Allergies  Allergen Reactions  . Sulfonamide Derivatives     REACTION: unknown reaction   Current Outpatient Prescriptions on File Prior to Visit  Medication Sig Dispense Refill  . aspirin 325 MG tablet Take 325 mg by mouth daily.        Marland Kitchen atorvastatin (LIPITOR) 80 MG tablet Take 1 tablet (80 mg total) by mouth daily.  30 tablet  6  . fish oil-omega-3 fatty acids 1000 MG capsule Take 2 g by mouth daily.        . metoprolol (LOPRESSOR) 50 MG tablet Take 1 tablet (50 mg total) by mouth 2 (two) times daily.  60 tablet  1  . Multiple Vitamin (MULTIVITAMIN) tablet Take 1 tablet by mouth daily.           Past Medical History, Surgical History, Social History, Family History, Problem List, Medications, and Allergies have been reviewed and updated if relevant.  Review of Systems:  General: Denies fever, chills, sweats. No significant weight loss. Eyes: Denies blurring,significant itching ENT: Denies earache, sore throat, and hoarseness. Cardiovascular: Denies chest pains, palpitations, dyspnea on exertion Respiratory: Denies cough, dyspnea at rest,wheeezing Breast: no concerns about lumps GI: Denies nausea, vomiting, diarrhea,  constipation, change in bowel habits, abdominal pain, melena, hematochezia GU: Denies penile discharge, ED, urinary flow / outflow problems. No STD concerns. Musculoskeletal: Denies back pain, joint pain Derm: Denies rash. Nipple itching + Neuro: Denies  paresthesias, frequent falls, frequent headaches Psych: Denies depression, anxiety Endocrine: Denies cold intolerance, heat intolerance, polydipsia Heme: Denies enlarged lymph nodes Allergy: No hayfever   Physical Examination: Filed Vitals:   10/11/11 0841  BP: 150/90  Pulse: 58  Temp: 97.7 F (36.5 C)  TempSrc: Oral  Height: 5\' 8"  (1.727 m)  Weight: 182 lb 12.8 oz (82.918 kg)  SpO2: 98%    Body mass index is 27.79 kg/(m^2).   Wt Readings from Last 3 Encounters:  10/11/11 182 lb 12.8 oz (82.918 kg)  09/04/11 186 lb 4 oz (84.482 kg)  08/02/11 183 lb 12 oz (83.348 kg)    GEN: well developed, well nourished, no acute distress Eyes: conjunctiva and lids normal, PERRLA, EOMI ENT: TM clear, nares clear, oral exam WNL Neck: supple, no lymphadenopathy, no thyromegaly, no JVD Pulm: clear to auscultation and percussion, respiratory effort normal CV: regular rate and rhythm, S1-S2, no murmur, rub or gallop, no bruits, peripheral pulses normal and symmetric, no cyanosis, clubbing, edema or varicosities Chest: no scars, masses GI: soft, non-tender; no hepatosplenomegaly, masses; active bowel sounds all quadrants GU: no hernia, testicular mass, penile discharge, or prostate enlargement Lymph: no cervical, axillary or inguinal adenopathy MSK: gait normal, muscle tone and strength WNL, no joint swelling, effusions, discoloration, crepitus  SKIN: clear, good turgor, color WNL, no rashes, lesions, or ulcerations Neuro: normal mental status, normal strength, sensation, and motion Psych: alert; oriented to person, place and time, normally interactive and not anxious or depressed in appearance.   Assessment and Plan: 1. Routine general  medical examination at a health care facility : The patient's preventative maintenance and recommended screening tests for an annual wellness exam were reviewed in full today. Brought up to date unless services declined.  Counselled on the importance of diet, exercise, and its role in overall health and mortality. The patient's FH and SH was reviewed, including their home life, tobacco status, and drug and alcohol status.   Given Zostavax script   2. Hypertension :unstable, increase norvasc dosing amLODipine-valsartan (EXFORGE) 10-160 MG per tablet  3. Uncontrolled diabetes mellitus : Markedly unstable. Start metformin, titrate up to 2 tablets daily. Referral for diabetic education.   >25 minutes spent in face to face time with patient, >50% spent in counselling or coordination of care: 25 minutes spent in discussion of diabetes and hypertension wound. The majority of this is spent in discussion of diabetes, diabetes care, pathophysiology, medication titration, and long-term implications.  metFORMIN (GLUCOPHAGE XR) 500 MG 24 hr tablet, Ambulatory referral to diabetic education  4. Immunization due  Pneumococcal polysaccharide vaccine 23-valent greater than or equal to 2yo subcutaneous/IM  5. HYPERLIPIDEMIA stable   6. HTN (hypertension)    7. CAD (coronary artery disease), autologous vein bypass graft      I have personally reviewed the Medicare Annual Wellness questionnaire and have noted 1. The patient's medical and social history 2. Their use of alcohol, tobacco or illicit drugs 3. Their current medications and supplements 4. The patient's functional ability including ADL's, fall risks, home safety risks and hearing or visual             impairment. 5. Diet and physical activities 6. Evidence for depression or mood disorders  The patients weight, height, BMI and visual acuity have been recorded in the chart I have made referrals, counseling and provided education to the patient based  review of the above and I have provided the pt with a written personalized care plan for preventive services.  I have provided the patient with a copy of your personalized plan for preventive services. Instructed to take the time to review along with their updated medication list.

## 2011-10-11 NOTE — Patient Instructions (Signed)
Metformin -- take 1 tablet with breakfast for 1 week Then take 2 tablets with breakfast  REFERRAL: GO THE THE FRONT ROOM AT THE ENTRANCE OF OUR CLINIC, NEAR CHECK IN. ASK FOR MARION. SHE WILL HELP YOU SET UP YOUR REFERRAL. DATE: TIME:   Recheck with me in 8 weeks

## 2011-10-15 ENCOUNTER — Telehealth: Payer: Self-pay | Admitting: *Deleted

## 2011-10-15 MED ORDER — ATORVASTATIN CALCIUM 80 MG PO TABS: 80.0000 mg | ORAL_TABLET | Freq: Every day | ORAL | Status: DC

## 2011-10-15 NOTE — Telephone Encounter (Signed)
rx sent to pharmacy

## 2011-10-30 ENCOUNTER — Other Ambulatory Visit: Payer: Self-pay | Admitting: *Deleted

## 2011-10-30 MED ORDER — GLUCOSE BLOOD VI STRP: ORAL_STRIP | Status: AC

## 2011-10-30 MED ORDER — BL LANCETS THIN MISC: 1.0000 | Freq: Two times a day (BID) | Status: DC

## 2011-10-30 MED ORDER — FREESTYLE SYSTEM KIT: 1.0000 | PACK | Status: AC | PRN

## 2011-11-05 ENCOUNTER — Other Ambulatory Visit: Payer: Self-pay | Admitting: *Deleted

## 2011-11-05 MED ORDER — METOPROLOL TARTRATE 50 MG PO TABS: 50.0000 mg | ORAL_TABLET | Freq: Two times a day (BID) | ORAL | Status: DC

## 2011-11-05 NOTE — Telephone Encounter (Signed)
Received faxed refill request from pharmacy for Metoprolol. Refill sent to pharmacy electronically. 

## 2011-12-06 ENCOUNTER — Ambulatory Visit (INDEPENDENT_AMBULATORY_CARE_PROVIDER_SITE_OTHER): Payer: Medicare Other | Admitting: Family Medicine

## 2011-12-06 ENCOUNTER — Encounter: Payer: Self-pay | Admitting: Family Medicine

## 2011-12-06 VITALS — BP 130/72 | HR 62 | Temp 98.4°F | Ht 68.0 in | Wt 177.4 lb

## 2011-12-06 DIAGNOSIS — I10 Essential (primary) hypertension: Secondary | ICD-10-CM

## 2011-12-06 DIAGNOSIS — E1165 Type 2 diabetes mellitus with hyperglycemia: Secondary | ICD-10-CM

## 2011-12-06 DIAGNOSIS — I25810 Atherosclerosis of coronary artery bypass graft(s) without angina pectoris: Secondary | ICD-10-CM

## 2011-12-06 DIAGNOSIS — IMO0002 Reserved for concepts with insufficient information to code with codable children: Secondary | ICD-10-CM

## 2011-12-06 NOTE — Progress Notes (Signed)
  Patient Name: [name] Date of Birth: [birth year] Age: 66 y.o. Medical Record Number: [id number] Gender: male Date of Encounter: 12/06/2011  History of Present Illness:  [name] is a 66 y.o. very pleasant male patient who presents with the following:  DM, meds, education: he is tolerating the metformin without difficulty. The side effects, no nausea. No blurred vision. He is doing much better job with his diet. He has been going to the diabetic education classes with a lot of benefit from them.  Feeling ok.   Heart respective diet will conflict some with   Bought a motorcycle.  Also having some pain in his vertebrae and some pain his neck. Wants to ride a lot.   HTN: Tolerating all medications without side effects Stable and at goal No CP, no sob. No HA.  BP Readings from Last 3 Encounters:  12/06/11 130/72  10/11/11 150/90  09/04/11 146/90    Basic Metabolic Panel:    Component Value Date/Time   NA 139 10/08/2011 0914   K 4.4 10/08/2011 0914   CL 102 10/08/2011 0914   CO2 31 10/08/2011 0914   BUN 12 10/08/2011 0914   CREATININE 1.2 10/08/2011 0914   GLUCOSE 190* 10/08/2011 0914   CALCIUM 9.8 10/08/2011 0914    Past Medical History, Surgical History, Social History, Family History, Problem List, Medications, and Allergies have been reviewed and updated if relevant.  Review of Systems:  GEN: No acute illnesses, no fevers, chills. GI: No n/v/d, eating normally Pulm: No SOB Interactive and getting along well at home.  Otherwise, ROS is as per the HPI.   Physical Examination: Filed Vitals:   12/06/11 1037  BP: 130/72  Pulse: 62  Temp: 98.4 F (36.9 C)  TempSrc: Oral  Height: 5\' 8"  (1.727 m)  Weight: 177 lb 6.4 oz (80.468 kg)  SpO2: 97%    Body mass index is 26.97 kg/(m^2).   GEN: WDWN, NAD, Non-toxic, A & O x 3 HEENT: Atraumatic, Normocephalic. Neck supple. No masses, No LAD. Ears and Nose: No external deformity. CV: RRR, No M/G/R. No JVD. No  thrill. No extra heart sounds. PULM: CTA B, no wheezes, crackles, rhonchi. No retractions. No resp. distress. No accessory muscle use. EXTR: No c/c/e NEURO Normal gait.  PSYCH: Normally interactive. Conversant. Not depressed or anxious appearing.  Calm demeanor.   MSK: neck range of motion is basically full with some mild posterior pain. No inducible Sperling's.  Assessment and Plan: 1. Diabetes type 2, uncontrolled   2. CAD (coronary artery disease), autologous vein bypass graft   3. HTN (hypertension)     I reviewed his blood sugar record today, his blood sugars got much better. Tolerating all medications fine.  His blood pressure has now become goal level.  Reviewed some basic neck rehabilitation with him.  Followup in 6 months

## 2011-12-06 NOTE — Patient Instructions (Signed)
Google "[facility]"  Click on the videos section  Neck section

## 2012-01-01 ENCOUNTER — Telehealth: Payer: Self-pay | Admitting: *Deleted

## 2012-01-01 NOTE — Telephone Encounter (Signed)
Patient is on Exforge 10/160 and no longer has Rx insurance.  The cost is ~ $100/month.  He is requesting something less expensive.  Can we try another ARB that is in generic.....like Losartan along with the 10 mg Amlodipine?  [facility] Phone [phone]   Fax [phone]

## 2012-01-02 MED ORDER — LOSARTAN POTASSIUM 50 MG PO TABS: 50.0000 mg | ORAL_TABLET | Freq: Every day | ORAL | Status: DC

## 2012-01-02 MED ORDER — AMLODIPINE BESYLATE 10 MG PO TABS: 10.0000 mg | ORAL_TABLET | Freq: Every day | ORAL | Status: DC

## 2012-01-02 NOTE — Telephone Encounter (Signed)
Addended by: Consuello Masse on: 01/02/2012 08:51 AM   Modules accepted: Orders

## 2012-01-02 NOTE — Telephone Encounter (Signed)
Ok to d/c exforge  Change to losartan 50 mg daily Electronically prescribe to the pharmacy of their choice. (May call in if pharmacy does not participate in electronic prescriptions) Call in #30, 11 refills.  And Amlodipine 10 mg daily Electronically prescribe to the pharmacy of their choice. (May call in if pharmacy does not participate in electronic prescriptions) Call in #30, 11 refills.

## 2012-01-03 ENCOUNTER — Other Ambulatory Visit: Payer: Self-pay | Admitting: *Deleted

## 2012-01-03 MED ORDER — METOPROLOL TARTRATE 50 MG PO TABS: 50.0000 mg | ORAL_TABLET | Freq: Two times a day (BID) | ORAL | Status: DC

## 2012-05-05 ENCOUNTER — Encounter: Payer: Self-pay | Admitting: Internal Medicine

## 2012-06-11 ENCOUNTER — Ambulatory Visit (INDEPENDENT_AMBULATORY_CARE_PROVIDER_SITE_OTHER): Payer: Medicare Other | Admitting: Family Medicine

## 2012-06-11 ENCOUNTER — Encounter: Payer: Self-pay | Admitting: Family Medicine

## 2012-06-11 VITALS — BP 128/70 | HR 60 | Temp 98.1°F | Wt 180.0 lb

## 2012-06-11 DIAGNOSIS — F1010 Alcohol abuse, uncomplicated: Secondary | ICD-10-CM

## 2012-06-11 DIAGNOSIS — I25810 Atherosclerosis of coronary artery bypass graft(s) without angina pectoris: Secondary | ICD-10-CM

## 2012-06-11 DIAGNOSIS — I10 Essential (primary) hypertension: Secondary | ICD-10-CM

## 2012-06-11 DIAGNOSIS — E785 Hyperlipidemia, unspecified: Secondary | ICD-10-CM

## 2012-06-11 DIAGNOSIS — F329 Major depressive disorder, single episode, unspecified: Secondary | ICD-10-CM

## 2012-06-11 DIAGNOSIS — F3289 Other specified depressive episodes: Secondary | ICD-10-CM

## 2012-06-11 DIAGNOSIS — Z23 Encounter for immunization: Secondary | ICD-10-CM

## 2012-06-11 DIAGNOSIS — IMO0002 Reserved for concepts with insufficient information to code with codable children: Secondary | ICD-10-CM

## 2012-06-11 DIAGNOSIS — E1165 Type 2 diabetes mellitus with hyperglycemia: Secondary | ICD-10-CM

## 2012-06-11 NOTE — Progress Notes (Signed)
[facility] [address] [place] [address] Phone: [phone] Fax: [phone]  Date:  06/11/2012   Name:  [name]   DOB:  11-10-1945   MRN:  [id number] Gender: male Age: 66 y.o.  PCP:  Hannah Beat, MD  Evaluating MD: Hannah Beat, MD   Chief Complaint: Follow-up   History of Present Illness:  [name] is a 66 y.o. pleasant patient who presents with the following: multiple problems  Diabetes Mellitus: Tolerating Medications: yes Compliance with diet: good Exercise: rare Avg blood sugars at home: not checking Foot problems: none Hypoglycemia: none No nausea, vomitting, blurred vision, polyuria.  Lab Results  Component Value Date   HGBA1C 6.3 06/12/2012    Wt Readings from Last 3 Encounters:  06/11/12 180 lb (81.647 kg)  12/06/11 177 lb 6.4 oz (80.468 kg)  10/11/11 182 lb 12.8 oz (82.918 kg)    There is no height on file to calculate BMI.   The patient presents with a several week long history of R heel pain. This is notable for worsening pain first thing in the morning when arising and standing after sitting.   Prior foot or ankle fractures: none Prior operations: none Orthotics or bracing: Dr. Margart Sickles orthotics Medications: none PT or home rehab: some basic stretching Night splints: no Ice massage: no Ball massage: no  Metatarsal pain: no   Tiredness, disinterest, energy level: Some anhedonia. Denies sadness. No crying. Sleeping normally. Some guilt. Some decreased energy and some decreased interest. No suicidality or homicidality. His wife did die last year. Some history of depression coming as been on some medication historically, and he is also an avid tequila drinker. He does think that he might be drinking too much. We will drink anywhere from 2-4 or 5 liquor drinks a night, but sometimes will skip a night. He never has any eye openers.  History of coronary artery disease, status post coronary  artery bypass grafting: Currently on high-dose aspirin, and Lipitor, beta blocker, angiotensin receptor blocker, good blood glucose control. Blood pressure is at goal. He is currently on Lipitor 80 as well as Norvasc 10. He has some question about potentially getting off some medications, but we reviewed their indication  Throat clearing.   Patient Active Problem List  Diagnosis  . Diabetes type 2, uncontrolled  . HYPERLIPIDEMIA  . DEPRESSION  . OTHER MALAISE AND FATIGUE  . HX, PERSONAL, COLONIC POLYPS  . ALCOHOL ABUSE  . HTN (hypertension)  . CAD (coronary artery disease), autologous vein bypass graft    Past Medical History  Diagnosis Date  . HTN (hypertension) 10/03  . HLD (hyperlipidemia) 10/03  . DMII (diabetes mellitus, type 2) 10/03  . Depression   . CAD (coronary artery disease) 9/06    CABG x4  . Colon polyps   . Alcohol abuse, unspecified     Past Surgical History  Procedure Date  . Coronary artery bypass graft 9/06    x4  . Tonsillectomy   . Esophagogastroduodenoscopy 7/07    polyp; mucosal abnml  . Colonoscopy     Hem. Polyp; Hi grade dysplasia  . Colonoscopy 7/08    polyps    History  Substance Use Topics  . Smoking status: Former Games developer  . Smokeless tobacco: Never Used   Comment: quit 36 years ago  . Alcohol Use: Yes     Regular    Family History  Problem Relation Age of Onset  . Alcohol abuse Father   .  Stroke Father   . Other Father     cardiac problems  . COPD Mother   . Alcohol abuse Brother   . Cirrhosis Brother     EtOH  . Anxiety disorder Brother     Allergies  Allergen Reactions  . Sulfonamide Derivatives     REACTION: unknown reaction    Medication list has been reviewed and updated.  Outpatient Prescriptions Prior to Visit  Medication Sig Dispense Refill  . amLODipine (NORVASC) 10 MG tablet Take 1 tablet (10 mg total) by mouth daily.  30 tablet  11  . aspirin 325 MG tablet Take 325 mg by mouth daily.        Marland Kitchen  atorvastatin (LIPITOR) 80 MG tablet Take 1 tablet (80 mg total) by mouth daily.  30 tablet  11  . BL Lancets Thin MISC 1 cartridge by Does not apply route 2 (two) times daily.  100 each  11  . fish oil-omega-3 fatty acids 1000 MG capsule Take 2 g by mouth daily.        Marland Kitchen glucose blood test strip Use as instructed  100 each  12  . glucose monitoring kit (FREESTYLE) monitoring kit 1 each by Does not apply route as needed for other. Dx. 250.0  1 each  0  . losartan (COZAAR) 50 MG tablet Take 1 tablet (50 mg total) by mouth daily.  30 tablet  11  . metFORMIN (GLUCOPHAGE XR) 500 MG 24 hr tablet Take 2 tablets (1,000 mg total) by mouth daily with breakfast.  60 tablet  11  . metoprolol (LOPRESSOR) 50 MG tablet Take 1 tablet (50 mg total) by mouth 2 (two) times daily.  60 tablet  6  . Multiple Vitamin (MULTIVITAMIN) tablet Take 1 tablet by mouth daily.          Review of Systems:  Denies chest pain or shortness of breath. No fevers, chills or sweats.  Physical Examination: Filed Vitals:   06/11/12 1102  BP: 128/70  Pulse: 60  Temp: 98.1 F (36.7 C)  TempSrc: Oral  Weight: 180 lb (81.647 kg)    There is no height on file to calculate BMI. Ideal Body Weight:     GEN: WDWN, NAD, Non-toxic, A & O x 3 HEENT: Atraumatic, Normocephalic. Neck supple. No masses, No LAD. Ears and Nose: No external deformity. CV: RRR, No M/G/R. No JVD. No thrill. No extra heart sounds. PULM: CTA B, no wheezes, crackles, rhonchi. No retractions. No resp. distress. No accessory muscle use. EXTR: No c/c/e NEURO Normal gait.  PSYCH: Normally interactive. Conversant. Not depressed or anxious appearing.  Calm demeanor.    Assessment and Plan:  1. Diabetes type 2, uncontrolled : a1c at goal now Hemoglobin A1c  2. Need for prophylactic vaccination and inoculation against influenza  Flu vaccine greater than or equal to 3yo preservative free IM  3. HTN (hypertension) : stable and at goal   4. CAD (coronary artery  disease), autologous vein bypass graft : stable   5. HYPERLIPIDEMIA : stable   6. DEPRESSION : I think the numbers 6 and 7 are related here. He is going to try to decrease his alcohol intake, if not stop it entirely. He is not acutely depressed, but he is having some decrease in energy, interest, and some anhedonia. He is going to call me if he feels like he is getting worse.   7. ALCOHOL ABUSE      Orders Today:  Orders Placed This Encounter  Procedures  .  Flu vaccine greater than or equal to 3yo preservative free IM  . Hemoglobin A1c    Updated Medication List: (Includes new medications, updates to list, dose adjustments) No orders of the defined types were placed in this encounter.    Medications Discontinued: There are no discontinued medications.   Hannah Beat, MD

## 2012-06-12 LAB — HEMOGLOBIN A1C: Hgb A1c MFr Bld: 6.3 % (ref 4.6–6.5)

## 2012-08-15 ENCOUNTER — Other Ambulatory Visit: Payer: Self-pay

## 2012-08-15 MED ORDER — METOPROLOL TARTRATE 50 MG PO TABS: 50.0000 mg | ORAL_TABLET | Freq: Two times a day (BID) | ORAL | Status: DC

## 2012-08-15 NOTE — Telephone Encounter (Signed)
[facility] faxed refill request metoprolol 50 mg # 60 x 6.

## 2012-08-15 NOTE — Telephone Encounter (Signed)
Pt called to ck on status of metoprolol refill; advised pt was sent electronically this morning at 9:25 am to [facility]. Pt will ck with pharmacy.

## 2012-10-01 ENCOUNTER — Other Ambulatory Visit: Payer: Self-pay | Admitting: *Deleted

## 2012-10-01 DIAGNOSIS — E1165 Type 2 diabetes mellitus with hyperglycemia: Secondary | ICD-10-CM

## 2012-10-01 MED ORDER — METFORMIN HCL ER 500 MG PO TB24: 1000.0000 mg | ORAL_TABLET | Freq: Every day | ORAL | Status: DC

## 2012-10-01 MED ORDER — ATORVASTATIN CALCIUM 80 MG PO TABS: 80.0000 mg | ORAL_TABLET | Freq: Every day | ORAL | Status: DC

## 2012-11-28 ENCOUNTER — Other Ambulatory Visit: Payer: Self-pay | Admitting: Family Medicine

## 2012-11-28 DIAGNOSIS — Z125 Encounter for screening for malignant neoplasm of prostate: Secondary | ICD-10-CM

## 2012-11-28 DIAGNOSIS — Z79899 Other long term (current) drug therapy: Secondary | ICD-10-CM

## 2012-11-28 DIAGNOSIS — E1059 Type 1 diabetes mellitus with other circulatory complications: Secondary | ICD-10-CM

## 2012-11-28 DIAGNOSIS — E785 Hyperlipidemia, unspecified: Secondary | ICD-10-CM

## 2012-12-03 ENCOUNTER — Other Ambulatory Visit (INDEPENDENT_AMBULATORY_CARE_PROVIDER_SITE_OTHER): Payer: Medicare Other

## 2012-12-03 DIAGNOSIS — E1059 Type 1 diabetes mellitus with other circulatory complications: Secondary | ICD-10-CM

## 2012-12-03 DIAGNOSIS — Z125 Encounter for screening for malignant neoplasm of prostate: Secondary | ICD-10-CM

## 2012-12-03 DIAGNOSIS — E785 Hyperlipidemia, unspecified: Secondary | ICD-10-CM

## 2012-12-03 DIAGNOSIS — Z79899 Other long term (current) drug therapy: Secondary | ICD-10-CM

## 2012-12-03 LAB — HEMOGLOBIN A1C: Hgb A1c MFr Bld: 6.9 % — ABNORMAL HIGH (ref 4.6–6.5)

## 2012-12-03 LAB — PSA, MEDICARE: PSA: 1.02 ng/ml (ref 0.10–4.00)

## 2012-12-03 LAB — HEPATIC FUNCTION PANEL
ALT: 44 U/L (ref 0–53)
Bilirubin, Direct: 0.1 mg/dL (ref 0.0–0.3)
Total Bilirubin: 1.1 mg/dL (ref 0.3–1.2)

## 2012-12-03 LAB — BASIC METABOLIC PANEL
BUN: 18 mg/dL (ref 6–23)
Creatinine, Ser: 1.2 mg/dL (ref 0.4–1.5)
GFR: 61.89 mL/min (ref >60.00)
Glucose, Bld: 148 mg/dL — ABNORMAL HIGH (ref 70–99)
Potassium: 4.6 mEq/L (ref 3.5–5.1)

## 2012-12-03 LAB — CBC WITH DIFFERENTIAL/PLATELET
Basophils Relative: 0.8 % (ref 0.0–3.0)
Eosinophils Absolute: 0.2 10*3/uL (ref 0.0–0.7)
Eosinophils Relative: 4.3 % (ref 0.0–5.0)
HCT: 45.8 % (ref 39.0–52.0)
Hemoglobin: 15.7 g/dL (ref 13.0–17.0)
MCHC: 34.3 g/dL (ref 30.0–36.0)
MCV: 91.3 fl (ref 78.0–100.0)
Monocytes Absolute: 0.6 10*3/uL (ref 0.1–1.0)
Neutro Abs: 3.1 10*3/uL (ref 1.4–7.7)
Neutrophils Relative %: 55.4 % (ref 43.0–77.0)
RBC: 5.01 Mil/uL (ref 4.22–5.81)
WBC: 5.6 10*3/uL (ref 4.5–10.5)

## 2012-12-03 LAB — LIPID PANEL
Cholesterol: 128 mg/dL (ref 0–200)
Total CHOL/HDL Ratio: 5

## 2012-12-03 LAB — MICROALBUMIN / CREATININE URINE RATIO
Creatinine,U: 123.4 mg/dL
Microalb Creat Ratio: 1.1 mg/g (ref 0.0–30.0)
Microalb, Ur: 1.3 mg/dL (ref 0.0–1.9)

## 2012-12-10 ENCOUNTER — Encounter: Payer: Medicare Other | Admitting: Family Medicine

## 2012-12-10 ENCOUNTER — Ambulatory Visit (INDEPENDENT_AMBULATORY_CARE_PROVIDER_SITE_OTHER): Payer: Medicare Other | Admitting: Family Medicine

## 2012-12-10 ENCOUNTER — Encounter: Payer: Self-pay | Admitting: Family Medicine

## 2012-12-10 VITALS — BP 140/84 | HR 58 | Temp 98.3°F | Ht 68.0 in | Wt 184.5 lb

## 2012-12-10 DIAGNOSIS — Z0000 Encounter for general adult medical examination without abnormal findings: Secondary | ICD-10-CM

## 2012-12-10 DIAGNOSIS — F329 Major depressive disorder, single episode, unspecified: Secondary | ICD-10-CM

## 2012-12-10 DIAGNOSIS — E785 Hyperlipidemia, unspecified: Secondary | ICD-10-CM

## 2012-12-10 DIAGNOSIS — E1165 Type 2 diabetes mellitus with hyperglycemia: Secondary | ICD-10-CM

## 2012-12-10 DIAGNOSIS — I25810 Atherosclerosis of coronary artery bypass graft(s) without angina pectoris: Secondary | ICD-10-CM

## 2012-12-10 DIAGNOSIS — I10 Essential (primary) hypertension: Secondary | ICD-10-CM

## 2012-12-10 NOTE — Progress Notes (Signed)
[facility] [address] [place] [address] Phone: [phone] Fax: [phone]  Date:  12/10/2012   Name:  [name]   DOB:  1946/02/24   MRN:  [id number] Gender: male Age: 67 y.o.  Primary Physician:  Hannah Beat, MD  Evaluating MD: Hannah Beat, MD   Chief Complaint: Annual Exam   History of Present Illness:  [name] is a 68 y.o. pleasant patient who presents with the following:  Medicare wellness and f/u:  Has someminor aches and pains Will have some occ posterior neck pain - mostly with riding his motorcycle.  Living [place] now with girlfriend. She does have some anger issues  Zostavax - had shingles when 67 years old.  Preventative Health Maintenance Visit:  Health Maintenance Summary Reviewed and updated, unless pt declines services.  Tobacco History Reviewed. Alcohol: 4 oz of tequila each night Exercise Habits: Some activity, rec at least 30 mins 5 times a week STD concerns: no risk or activity to increase risk Drug Use: None Encouraged self-testicular check  Health Maintenance  Topic Date Due  . Zostavax  05/20/2006  . Influenza Vaccine  04/20/2013  . Tetanus/tdap  04/13/2019  . Colonoscopy  06/11/2019  . Pneumococcal Polysaccharide Vaccine Age 71 And Over  Completed    Labs reviewed with the patient.  Results for orders placed in visit on 12/03/12  LIPID PANEL      Result Value Range   Cholesterol 128  0 - 200 mg/dL   Triglycerides 865.7 (*) 0.0 - 149.0 mg/dL   HDL 84.69 (*) >62.95 mg/dL   VLDL 28.4 (*) 0.0 - 13.2 mg/dL   Total CHOL/HDL Ratio 5    HEMOGLOBIN G4W      Result Value Range   Hemoglobin A1C 6.9 (*) 4.6 - 6.5 %  MICROALBUMIN / CREATININE URINE RATIO      Result Value Range   Microalb, Ur 1.3  0.0 - 1.9 mg/dL   Creatinine,U 102.7     Microalb Creat Ratio 1.1  0.0 - 30.0 mg/g  HEPATIC FUNCTION PANEL      Result Value Range   Total Bilirubin 1.1  0.3 - 1.2 mg/dL   Bilirubin, Direct 0.1  0.0 - 0.3 mg/dL   Alkaline Phosphatase 43  39 - 117 U/L   AST 26  0 - 37 U/L   ALT 44  0 - 53 U/L   Total Protein 6.8  6.0 - 8.3 g/dL   Albumin 4.4  3.5 - 5.2 g/dL  CBC WITH DIFFERENTIAL      Result Value Range   WBC 5.6  4.5 - 10.5 K/uL   RBC 5.01  4.22 - 5.81 Mil/uL   Hemoglobin 15.7  13.0 - 17.0 g/dL   HCT 25.3  66.4 - 40.3 %   MCV 91.3  78.0 - 100.0 fl   MCHC 34.3  30.0 - 36.0 g/dL   RDW 47.4  25.9 - 56.3 %   Platelets 182.0  150.0 - 400.0 K/uL   Neutrophils Relative 55.4  43.0 - 77.0 %   Lymphocytes Relative 29.7  12.0 - 46.0 %   Monocytes Relative 9.8  3.0 - 12.0 %   Eosinophils Relative 4.3  0.0 - 5.0 %   Basophils Relative 0.8  0.0 - 3.0 %   Neutro Abs 3.1  1.4 - 7.7 K/uL   Lymphs Abs 1.7  0.7 - 4.0 K/uL   Monocytes Absolute 0.6  0.1 - 1.0 K/uL  Eosinophils Absolute 0.2  0.0 - 0.7 K/uL   Basophils Absolute 0.0  0.0 - 0.1 K/uL  BASIC METABOLIC PANEL      Result Value Range   Sodium 138  135 - 145 mEq/L   Potassium 4.6  3.5 - 5.1 mEq/L   Chloride 100  96 - 112 mEq/L   CO2 27  19 - 32 mEq/L   Glucose, Bld 148 (*) 70 - 99 mg/dL   BUN 18  6 - 23 mg/dL   Creatinine, Ser 1.2  0.4 - 1.5 mg/dL   Calcium 40.9  8.4 - 81.1 mg/dL   GFR 91.47  >82.95 mL/min  PSA, MEDICARE      Result Value Range   PSA 1.02  0.10 - 4.00 ng/ml  LDL CHOLESTEROL, DIRECT      Result Value Range   Direct LDL 65.1     Dep: he is having some depression and anhedonia. Wife [name] died 2 years ago. Now living with Stanton Kidney, his new girlfriend. Has had this before. Had tried to get girlfriend to go to counselling with him. No si or hi.  Lipids: Doing well, stable. Tolerating meds fine with no SE. Trigs up this year - cheating on diet. Panel reviewed with patient.  Lipids:    Component Value Date/Time   CHOL 128 12/03/2012 1039   TRIG 359.0* 12/03/2012 1039   HDL 27.00* 12/03/2012 1039   LDLDIRECT 65.1 12/03/2012 1039   VLDL 71.8* 12/03/2012 1039   CHOLHDL 5 12/03/2012 1039     Lab Results  Component Value Date   ALT 44 12/03/2012   AST 26 12/03/2012   ALKPHOS 43 12/03/2012   BILITOT 1.1 12/03/2012   Diabetes Mellitus: Tolerating Medications: yes Compliance with diet: fair Exercise: not that well Avg blood sugars at home: not checking Foot problems: none Hypoglycemia: none No nausea, vomitting, blurred vision, polyuria.  Lab Results  Component Value Date   HGBA1C 6.9* 12/03/2012    Wt Readings from Last 3 Encounters:  12/10/12 184 lb 8 oz (83.689 kg)  06/11/12 180 lb (81.647 kg)  12/06/11 177 lb 6.4 oz (80.468 kg)   HTN: Tolerating all medications without side effects BP up a little today compared to recent ov. No CP, no sob. No HA.  BP Readings from Last 3 Encounters:  12/10/12 140/84  06/11/12 128/70  12/06/11 130/72    CAD, s/p CABG: he has not seen his cardiologist, Dr Gala Romney in 5-6 years. Mostly due to care of dying wife with end stage alzheimers. Compliant with b blocker, arb, lipitor, asa. Dm under descent control.  Body mass index is 28.06 kg/(m^2).   Patient Active Problem List  Diagnosis  . Diabetes type 2, uncontrolled  . HYPERLIPIDEMIA  . DEPRESSION  . OTHER MALAISE AND FATIGUE  . HX, PERSONAL, COLONIC POLYPS  . ALCOHOL ABUSE  . HTN (hypertension)  . CAD (coronary artery disease), autologous vein bypass graft    Past Medical History  Diagnosis Date  . HTN (hypertension) 10/03  . HLD (hyperlipidemia) 10/03  . DMII (diabetes mellitus, type 2) 10/03  . Depression   . CAD (coronary artery disease) 9/06    CABG x4  . Colon polyps   . Alcohol abuse, unspecified     Past Surgical History  Procedure Laterality Date  . Coronary artery bypass graft  9/06    x4  . Tonsillectomy    . Esophagogastroduodenoscopy  7/07    polyp; mucosal abnml  . Colonoscopy  Hem. Polyp; Hi grade dysplasia  . Colonoscopy  7/08    polyps    History   Social History  . Marital Status: Married    Spouse Name: N/A    Number of  Children: N/A  . Years of Education: N/A   Occupational History  . Not on file.   Social History Main Topics  . Smoking status: Former Games developer  . Smokeless tobacco: Never Used     Comment: quit 36 years ago  . Alcohol Use: Yes     Comment: Regular  . Drug Use: No  . Sexually Active: Not on file   Other Topics Concern  . Not on file   Social History Narrative   Cares for his wife who is disabled with dementia          Family History  Problem Relation Age of Onset  . Alcohol abuse Father   . Stroke Father   . Other Father     cardiac problems  . COPD Mother   . Alcohol abuse Brother   . Cirrhosis Brother     EtOH  . Anxiety disorder Brother     Allergies  Allergen Reactions  . Sulfonamide Derivatives     REACTION: unknown reaction    Medication list has been reviewed and updated.  Outpatient Prescriptions Prior to Visit  Medication Sig Dispense Refill  . amLODipine (NORVASC) 10 MG tablet Take 1 tablet (10 mg total) by mouth daily.  30 tablet  11  . aspirin 325 MG tablet Take 325 mg by mouth daily.        Marland Kitchen atorvastatin (LIPITOR) 80 MG tablet Take 1 tablet (80 mg total) by mouth daily.  30 tablet  6  . BL Lancets Thin MISC 1 cartridge by Does not apply route 2 (two) times daily.  100 each  11  . fish oil-omega-3 fatty acids 1000 MG capsule Take 2 g by mouth daily.        Marland Kitchen losartan (COZAAR) 50 MG tablet Take 1 tablet (50 mg total) by mouth daily.  30 tablet  11  . metFORMIN (GLUCOPHAGE XR) 500 MG 24 hr tablet Take 2 tablets (1,000 mg total) by mouth daily with breakfast.  60 tablet  6  . metoprolol (LOPRESSOR) 50 MG tablet Take 1 tablet (50 mg total) by mouth 2 (two) times daily.  60 tablet  6  . Multiple Vitamin (MULTIVITAMIN) tablet Take 1 tablet by mouth daily.         No facility-administered medications prior to visit.    Review of Systems:   General: Denies fever, chills, sweats. No significant weight loss. Eyes: Denies blurring,significant  itching ENT: Denies earache, sore throat, and hoarseness. Cardiovascular: Denies chest pains, palpitations, dyspnea on exertion Respiratory: Denies cough, dyspnea at rest,wheeezing Breast: no concerns about lumps GI: Denies nausea, vomiting, diarrhea, constipation, change in bowel habits, abdominal pain, melena, hematochezia GU: Denies penile discharge, ED, urinary flow / outflow problems. No STD concerns. Musculoskeletal: Denies back pain, joint pain - some neck pain Derm: Denies rash, itching Neuro: Denies  paresthesias, frequent falls, frequent headaches Psych: as above Endocrine: Denies cold intolerance, heat intolerance, polydipsia Heme: Denies enlarged lymph nodes Allergy: No hayfever   Physical Examination: BP 140/84  Pulse 58  Temp(Src) 98.3 F (36.8 C) (Oral)  Ht 5\' 8"  (1.727 m)  Wt 184 lb 8 oz (83.689 kg)  BMI 28.06 kg/m2  SpO2 96%  Ideal Body Weight: Weight in (lb) to have BMI = 25: 164.1  Wt Readings from Last 3 Encounters:  12/10/12 184 lb 8 oz (83.689 kg)  06/11/12 180 lb (81.647 kg)  12/06/11 177 lb 6.4 oz (80.468 kg)    GEN: well developed, well nourished, no acute distress Eyes: conjunctiva and lids normal, PERRLA, EOMI ENT: TM clear, nares clear, oral exam WNL Neck: supple, no lymphadenopathy, no thyromegaly, no JVD Pulm: clear to auscultation and percussion, respiratory effort normal CV: regular rate and rhythm, S1-S2, no murmur, rub or gallop, no bruits, peripheral pulses normal and symmetric, no cyanosis, clubbing, edema or varicosities Chest: no scars, masses GI: soft, non-tender; no hepatosplenomegaly, masses; active bowel sounds all quadrants GU: no hernia, testicular mass, penile discharge, or prostate enlargement Lymph: no cervical, axillary or inguinal adenopathy MSK: gait normal, muscle tone and strength WNL, no joint swelling, effusions, discoloration, crepitus  SKIN: clear, good turgor, color WNL, no rashes, lesions, or ulcerations Neuro:  normal mental status, normal strength, sensation, and motion Psych: alert; oriented to person, place and time, normally interactive and not anxious or depressed in appearance.  Assessment and Plan:  Routine general medical examination at a health care facility  Diabetes type 2, uncontrolled - stable  HTN (hypertension) - it has been stable, mildly elevated today. Difficult always given his ETOH intake if that is causing elevation  CAD (coronary artery disease), autologous vein bypass graft - Plan: Ambulatory referral to Cardiology: consult Cards in patient with history of CABG x 4. He is stable and compliant, but I think he should at least be established with cardiology.  HYPERLIPIDEMIA: trigs up higher, he will do better on his diet.  DEPRESSION: He will think about counselling. No meds for now -- he did them about 10 years ago.  I have personally reviewed the Medicare Annual Wellness questionnaire and have noted 1. The patient's medical and social history 2. Their use of alcohol, tobacco or illicit drugs 3. Their current medications and supplements 4. The patient's functional ability including ADL's, fall risks, home safety risks and hearing or visual             impairment. 5. Diet and physical activities 6. Evidence for depression or mood disorders  The patients weight, height, BMI and visual acuity have been recorded in the chart I have made referrals, counseling and provided education to the patient based review of the above and I have provided the pt with a written personalized care plan for preventive services.  I have provided the patient with a copy of your personalized plan for preventive services. Instructed to take the time to review along with their updated medication list.   Orders Today:  Orders Placed This Encounter  Procedures  . Ambulatory referral to Cardiology    Referral Priority:  Routine    Referral Type:  Consultation    Referral Reason:  Specialty  Services Required    Referred to Provider:  Dolores Patty, MD    Requested Specialty:  Cardiology    Number of Visits Requested:  1    Updated Medication List: (Includes new medications, updates to list, dose adjustments) No orders of the defined types were placed in this encounter.    Medications Discontinued: There are no discontinued medications.    Signed, Elpidio Galea. Jamesyn Moorefield, MD 12/10/2012 9:13 AM

## 2012-12-10 NOTE — Patient Instructions (Addendum)
REFERRAL: GO THE THE FRONT ROOM AT THE ENTRANCE OF OUR CLINIC, NEAR CHECK IN. ASK FOR [name]. SHE WILL HELP YOU SET UP YOUR REFERRAL. DATE: TIME:   F/u 6 months 

## 2012-12-12 ENCOUNTER — Encounter: Payer: Self-pay | Admitting: Cardiovascular Disease

## 2012-12-12 ENCOUNTER — Ambulatory Visit (INDEPENDENT_AMBULATORY_CARE_PROVIDER_SITE_OTHER): Payer: Medicare Other | Admitting: Cardiovascular Disease

## 2012-12-12 VITALS — BP 138/82 | HR 55 | Ht 68.0 in | Wt 184.5 lb

## 2012-12-12 DIAGNOSIS — E785 Hyperlipidemia, unspecified: Secondary | ICD-10-CM

## 2012-12-12 DIAGNOSIS — IMO0001 Reserved for inherently not codable concepts without codable children: Secondary | ICD-10-CM

## 2012-12-12 DIAGNOSIS — I25810 Atherosclerosis of coronary artery bypass graft(s) without angina pectoris: Secondary | ICD-10-CM

## 2012-12-12 DIAGNOSIS — F1010 Alcohol abuse, uncomplicated: Secondary | ICD-10-CM

## 2012-12-12 DIAGNOSIS — I10 Essential (primary) hypertension: Secondary | ICD-10-CM

## 2012-12-12 DIAGNOSIS — E1165 Type 2 diabetes mellitus with hyperglycemia: Secondary | ICD-10-CM

## 2012-12-12 NOTE — Assessment & Plan Note (Signed)
We have encouraged continued exercise, careful diet management in an effort to lose weight. 

## 2012-12-12 NOTE — Assessment & Plan Note (Signed)
Currently with no symptoms of angina. No further workup at this time. Continue current medication regimen. Atypical type sensation in his chest at rest. Otherwise very active with no complaints. He will call us if these sensations get worse or more frequent. Unable to exclude arrhythmia and he will check his heart rate when these events occur.

## 2012-12-12 NOTE — Assessment & Plan Note (Signed)
Blood pressure is well controlled on today's visit. No changes made to the medications. 

## 2012-12-12 NOTE — Assessment & Plan Note (Signed)
Cholesterol is at goal on the current lipid regimen. No changes to the medications were made.  

## 2012-12-12 NOTE — Patient Instructions (Addendum)
You are doing well. No medication changes were made.  Please call us if you have new issues that need to be addressed before your next appt.  Your physician wants you to follow-up in: 6 months.  You will receive a reminder letter in the mail two months in advance. If you don't receive a letter, please call our office to schedule the follow-up appointment.   

## 2012-12-12 NOTE — Progress Notes (Signed)
Patient ID: [name], male    DOB: 1946-04-11, 67 y.o.   MRN: [id number]  HPI Comments: [name] is a very pleasant 89 short gentleman with remote history of coronary artery disease, bypass surgery in 2006x5 vessel after he developed anginal symptoms with pain radiating to his jaw with exertion, hyperlipidemia, diabetes, hypertension, who presents to establish care in the [place] office.  He reports that he is doing well. He is active, no symptoms of chest pain or shortness of breath with exertion. Recently joined a gym for 12 week special. Work out on a treadmill last week with no symptoms. At baseline, he is active. He does report having occasional hot sensation in his chest, typically comes on at rest, lasts for a short period of time, less than 30 minutes. Denies any change to his pulse rate. Wonders if it is his heart but not presenting with exertion.  No side effects from his medications. He has not had a cardiac catheterization or stent since his bypass surgery and reports that since the surgery, he has done well.  EKG today shows sinus bradycardia with rate 55 beats per minute, no significant ST or T wave changes   Outpatient Encounter Prescriptions as of 12/12/2012  Medication Sig Dispense Refill  . amLODipine (NORVASC) 10 MG tablet Take 1 tablet (10 mg total) by mouth daily.  30 tablet  11  . aspirin 325 MG tablet Take 325 mg by mouth daily.        Marland Kitchen atorvastatin (LIPITOR) 80 MG tablet Take 1 tablet (80 mg total) by mouth daily.  30 tablet  6  . BL Lancets Thin MISC 1 cartridge by Does not apply route 2 (two) times daily.  100 each  11  . fish oil-omega-3 fatty acids 1000 MG capsule Take 2 g by mouth daily.        Marland Kitchen losartan (COZAAR) 50 MG tablet Take 1 tablet (50 mg total) by mouth daily.  30 tablet  11  . metFORMIN (GLUCOPHAGE XR) 500 MG 24 hr tablet Take 2 tablets (1,000 mg total) by mouth daily with breakfast.  60 tablet  6  . metoprolol (LOPRESSOR) 50 MG tablet Take 1  tablet (50 mg total) by mouth 2 (two) times daily.  60 tablet  6  . Multiple Vitamin (MULTIVITAMIN) tablet Take 1 tablet by mouth daily.         No facility-administered encounter medications on file as of 12/12/2012.     Review of Systems  Constitutional: Negative.   HENT: Negative.   Eyes: Negative.   Respiratory: Negative.   Cardiovascular: Negative.   Gastrointestinal: Negative.   Musculoskeletal: Negative.   Skin: Negative.   Neurological: Negative.   Psychiatric/Behavioral: Negative.   All other systems reviewed and are negative.    BP 138/82  Pulse 55  Ht 5\' 8"  (1.727 m)  Wt 184 lb 8 oz (83.689 kg)  BMI 28.06 kg/m2  Physical Exam  Nursing note and vitals reviewed. Constitutional: He is oriented to person, place, and time. He appears well-developed and well-nourished.  HENT:  Head: Normocephalic.  Nose: Nose normal.  Mouth/Throat: Oropharynx is clear and moist.  Eyes: Conjunctivae are normal. Pupils are equal, round, and reactive to light.  Neck: Normal range of motion. Neck supple. No JVD present.  Cardiovascular: Normal rate, regular rhythm, S1 normal, S2 normal, normal heart sounds and intact distal pulses.  Exam reveals no gallop and no friction rub.   No murmur heard. Pulmonary/Chest: Effort normal  and breath sounds normal. No respiratory distress. He has no wheezes. He has no rales. He exhibits no tenderness.  Abdominal: Soft. Bowel sounds are normal. He exhibits no distension. There is no tenderness.  Musculoskeletal: Normal range of motion. He exhibits no edema and no tenderness.  Lymphadenopathy:    He has no cervical adenopathy.  Neurological: He is alert and oriented to person, place, and time. Coordination normal.  Skin: Skin is warm and dry. No rash noted. No erythema.  Psychiatric: He has a normal mood and affect. His behavior is normal. Judgment and thought content normal.      Assessment and Plan

## 2012-12-12 NOTE — Assessment & Plan Note (Signed)
Alcohol use was not discussed with him on today's visit. I will bring this up on his next visit to discuss moderation and possible complications from excess alcohol and heart disease.

## 2012-12-18 ENCOUNTER — Encounter: Payer: Self-pay | Admitting: Internal Medicine

## 2013-01-03 ENCOUNTER — Other Ambulatory Visit: Payer: Self-pay | Admitting: Family Medicine

## 2013-02-25 ENCOUNTER — Other Ambulatory Visit: Payer: Self-pay | Admitting: Family Medicine

## 2013-04-25 ENCOUNTER — Other Ambulatory Visit: Payer: Self-pay | Admitting: Family Medicine

## 2013-05-23 ENCOUNTER — Other Ambulatory Visit: Payer: Self-pay | Admitting: Family Medicine

## 2013-05-25 ENCOUNTER — Ambulatory Visit (INDEPENDENT_AMBULATORY_CARE_PROVIDER_SITE_OTHER): Payer: Medicare Other | Admitting: Family Medicine

## 2013-05-25 ENCOUNTER — Encounter: Payer: Self-pay | Admitting: Family Medicine

## 2013-05-25 VITALS — BP 130/80 | HR 54 | Temp 99.3°F | Ht 68.0 in | Wt 182.0 lb

## 2013-05-25 DIAGNOSIS — M25519 Pain in unspecified shoulder: Secondary | ICD-10-CM

## 2013-05-25 DIAGNOSIS — E119 Type 2 diabetes mellitus without complications: Secondary | ICD-10-CM

## 2013-05-25 DIAGNOSIS — I10 Essential (primary) hypertension: Secondary | ICD-10-CM

## 2013-05-25 DIAGNOSIS — M25511 Pain in right shoulder: Secondary | ICD-10-CM

## 2013-05-25 DIAGNOSIS — Z23 Encounter for immunization: Secondary | ICD-10-CM

## 2013-05-25 DIAGNOSIS — E785 Hyperlipidemia, unspecified: Secondary | ICD-10-CM

## 2013-05-25 DIAGNOSIS — E1165 Type 2 diabetes mellitus with hyperglycemia: Secondary | ICD-10-CM

## 2013-05-25 LAB — LIPID PANEL
LDL Cholesterol: 59 mg/dL (ref 0–99)
Total CHOL/HDL Ratio: 3
Triglycerides: 117 mg/dL (ref 0.0–149.0)

## 2013-05-25 NOTE — Progress Notes (Signed)
[facility] at [facility] [address] [place] [address] Phone: [phone] Fax: [phone]  Date:  05/25/2013   Name:  [name]   DOB:  02-01-1946   MRN:  [id number] Gender: male Age: 67 y.o.  Primary Physician:  Hannah Beat, MD   Chief Complaint: Diabetes   History of Present Illness:  [name] is a 67 y.o. pleasant patient who presents with the following:  F/u DM:  Diabetes Mellitus: Tolerating Medications: yes Compliance with diet: fairly good Exercise: 3-4 x a week Avg blood sugars at home: not checking Foot problems: none Hypoglycemia: none No nausea, vomitting, blurred vision, polyuria.  Lab Results  Component Value Date   HGBA1C 6.9* 12/03/2012    Wt Readings from Last 3 Encounters:  05/25/13 182 lb (82.555 kg)  12/12/12 184 lb 8 oz (83.689 kg)  12/10/12 184 lb 8 oz (83.689 kg)    Body mass index is 27.68 kg/(m^2).   Lipids: Doing well, stable. Tolerating meds fine with no SE. Panel reviewed with patient.  Lipids:    Component Value Date/Time   CHOL 128 12/03/2012 1039   TRIG 359.0* 12/03/2012 1039   HDL 27.00* 12/03/2012 1039   LDLDIRECT 65.1 12/03/2012 1039   VLDL 71.8* 12/03/2012 1039   CHOLHDL 5 12/03/2012 1039    Lab Results  Component Value Date   ALT 44 12/03/2012   AST 26 12/03/2012   ALKPHOS 43 12/03/2012   BILITOT 1.1 12/03/2012    HTN: Tolerating all medications without side effects Stable and at goal No CP, no sob. No HA.  BP Readings from Last 3 Encounters:  05/25/13 130/80  12/12/12 138/82  12/10/12 140/84    Basic Metabolic Panel:    Component Value Date/Time   NA 138 12/03/2012 1039   K 4.6 12/03/2012 1039   CL 100 12/03/2012 1039   CO2 27 12/03/2012 1039   BUN 18 12/03/2012 1039   CREATININE 1.2 12/03/2012 1039   GLUCOSE 148* 12/03/2012 1039   CALCIUM 10.0 12/03/2012 1039      R sided shoulder, grinding, and also signed up and went to the [facility] three times a week. Lifting some weight. Had  a cortisone shot about 7-8 years ago. Lateral raises hurt.  Also has some neck pain, knee pain, PF pain intermittently.   Has to clear throat all the time.   Patient Active Problem List   Diagnosis Date Noted  . CAD (coronary artery disease), autologous vein bypass graft 10/11/2011    Priority: High  . Diabetes type 2, uncontrolled 04/13/2009    Priority: High  . HTN (hypertension)     Priority: Medium  . HYPERLIPIDEMIA 05/13/2008    Priority: Medium  . ALCOHOL ABUSE 08/10/2010  . OTHER MALAISE AND FATIGUE 04/13/2009  . HX, PERSONAL, COLONIC POLYPS 03/12/2007  . DEPRESSION 12/03/2006    Past Medical History  Diagnosis Date  . HTN (hypertension) 10/03  . HLD (hyperlipidemia) 10/03  . DMII (diabetes mellitus, type 2) 10/03  . Depression   . CAD (coronary artery disease) 9/06    CABG x4  . Colon polyps   . Alcohol abuse, unspecified     Past Surgical History  Procedure Laterality Date  . Coronary artery bypass graft  9/06    x4  . Tonsillectomy    . Esophagogastroduodenoscopy  7/07    polyp; mucosal abnml  . Colonoscopy      Hem. Polyp; Hi grade dysplasia  . Colonoscopy  7/08  polyps    History   Social History  . Marital Status: Married    Spouse Name: N/A    Number of Children: N/A  . Years of Education: N/A   Occupational History  . Not on file.   Social History Main Topics  . Smoking status: Former Games developer  . Smokeless tobacco: Never Used     Comment: quit 36 years ago  . Alcohol Use: Yes     Comment: Regular  . Drug Use: No  . Sexual Activity: Not on file   Other Topics Concern  . Not on file   Social History Narrative   Cares for his wife who is disabled with dementia          Family History  Problem Relation Age of Onset  . Alcohol abuse Father   . Stroke Father   . Other Father     cardiac problems  . COPD Mother   . Alcohol abuse Brother   . Cirrhosis Brother     EtOH  . Anxiety disorder Brother     Allergies  Allergen  Reactions  . Sulfonamide Derivatives     REACTION: unknown reaction    Medication list has been reviewed and updated.  Outpatient Prescriptions Prior to Visit  Medication Sig Dispense Refill  . amLODipine (NORVASC) 10 MG tablet take 1 tablet by mouth once daily  30 tablet  6  . aspirin 325 MG tablet Take 325 mg by mouth daily.        Marland Kitchen atorvastatin (LIPITOR) 80 MG tablet take 1 tablet by mouth once daily  30 tablet  2  . BL Lancets Thin MISC 1 cartridge by Does not apply route 2 (two) times daily.  100 each  11  . fish oil-omega-3 fatty acids 1000 MG capsule Take 2 g by mouth daily.        Marland Kitchen losartan (COZAAR) 50 MG tablet take 1 tablet by mouth once daily  30 tablet  6  . metFORMIN (GLUCOPHAGE-XR) 500 MG 24 hr tablet take 2 tablets by mouth once daily with BREAKFAST  60 tablet  2  . metoprolol (LOPRESSOR) 50 MG tablet take 1 tablet by mouth twice a day  60 tablet  5  . Multiple Vitamin (MULTIVITAMIN) tablet Take 1 tablet by mouth daily.         No facility-administered medications prior to visit.    Review of Systems:   GEN: No acute illnesses, no fevers, chills. GI: No n/v/d, eating normally Pulm: No SOB Interactive and getting along well at home.  Otherwise, ROS is as per the HPI.   Physical Examination: BP 130/80  Pulse 54  Temp(Src) 99.3 F (37.4 C) (Oral)  Ht 5\' 8"  (1.727 m)  Wt 182 lb (82.555 kg)  BMI 27.68 kg/m2  Ideal Body Weight: Weight in (lb) to have BMI = 25: 164.1   GEN: WDWN, NAD, Non-toxic, A & O x 3 HEENT: Atraumatic, Normocephalic. Neck supple. No masses, No LAD. Ears and Nose: No external deformity. CV: RRR, No M/G/R. No JVD. No thrill. No extra heart sounds. PULM: CTA B, no wheezes, crackles, rhonchi. No retractions. No resp. distress. No accessory muscle use. EXTR: No c/c/e NEURO Normal gait.  PSYCH: Normally interactive. Conversant. Not depressed or anxious appearing.  Calm demeanor.    Shoulder: R Inspection: No muscle wasting or  winging Ecchymosis/edema: neg  AC joint, scapula, clavicle: NT Cervical spine: NT, full ROM Spurling's: neg Abduction: full, 5/5 Flexion: full, 5/5 IR, full,  lift-off: 5/5 ER at neutral: full, 5/5 AC crossover and compression: neg Neer: neg Hawkins: mild pos Drop Test: neg Empty Can: neg Supraspinatus insertion: NT Bicipital groove: NT Speed's: neg Yergason's: neg Sulcus sign: neg Scapular dyskinesis: none C5-T1 intact Sensation intact Grip 5/5   Assessment and Plan:  Type II or unspecified type diabetes mellitus without mention of complication, not stated as uncontrolled - Plan: Hemoglobin A1c  Need for prophylactic vaccination and inoculation against influenza - Plan: Flu Vaccine QUAD 36+ mos PF IM (Fluarix)  Other and unspecified hyperlipidemia - Plan: Lipid panel  Diabetes type 2, uncontrolled  HTN (hypertension)  Shoulder pain, right  Check labs  Given [facility] shoulder, avoid certain exercises htn stable Check lipids  Orders Today:  Orders Placed This Encounter  Procedures  . Flu Vaccine QUAD 36+ mos PF IM (Fluarix)  . Hemoglobin A1c  . Lipid panel    Updated Medication List: (Includes new medications, updates to list, dose adjustments) No orders of the defined types were placed in this encounter.    Medications Discontinued: There are no discontinued medications.    Signed,  Elpidio Galea. Ivett Luebbe, MD

## 2013-05-25 NOTE — Patient Instructions (Addendum)
Omeprazole 20 mg, 30 minutes before breakfast.

## 2013-05-26 ENCOUNTER — Encounter: Payer: Self-pay | Admitting: *Deleted

## 2013-06-08 ENCOUNTER — Ambulatory Visit (INDEPENDENT_AMBULATORY_CARE_PROVIDER_SITE_OTHER): Payer: Medicare Other | Admitting: Cardiovascular Disease

## 2013-06-08 ENCOUNTER — Encounter: Payer: Self-pay | Admitting: Cardiovascular Disease

## 2013-06-08 VITALS — BP 138/84 | HR 59 | Ht 68.0 in | Wt 181.8 lb

## 2013-06-08 DIAGNOSIS — I25810 Atherosclerosis of coronary artery bypass graft(s) without angina pectoris: Secondary | ICD-10-CM

## 2013-06-08 DIAGNOSIS — E1165 Type 2 diabetes mellitus with hyperglycemia: Secondary | ICD-10-CM

## 2013-06-08 DIAGNOSIS — I10 Essential (primary) hypertension: Secondary | ICD-10-CM

## 2013-06-08 DIAGNOSIS — E785 Hyperlipidemia, unspecified: Secondary | ICD-10-CM

## 2013-06-08 NOTE — Patient Instructions (Signed)
You are doing well. No medication changes were made.  Ok to cut the lipitor in 1/2 if you would like every other day  Please call us if you have new issues that need to be addressed before your next appt.  Your physician wants you to follow-up in: 6 months.  You will receive a reminder letter in the mail two months in advance. If you don't receive a letter, please call our office to schedule the follow-up appointment.

## 2013-06-08 NOTE — Assessment & Plan Note (Signed)
Blood pressure is well controlled on today's visit. No changes made to the medications. 

## 2013-06-08 NOTE — Assessment & Plan Note (Signed)
Labs look good. Encouraged him to keep working out.

## 2013-06-08 NOTE — Assessment & Plan Note (Signed)
Cholesterol is at goal on the current lipid regimen. No changes to the medications were made.  

## 2013-06-08 NOTE — Assessment & Plan Note (Signed)
Currently with no symptoms of angina. No further workup at this time. Continue current medication regimen. 

## 2013-06-08 NOTE — Progress Notes (Signed)
Patient ID: [name], male    DOB: Aug 27, 1945, 67 y.o.   MRN: [id number]  HPI Comments: [name] is a very pleasant 19 short gentleman with remote history of coronary artery disease, bypass surgery in 2006 x5 vessel after he developed anginal symptoms with pain radiating to his jaw with exertion, hyperlipidemia, diabetes, hypertension, who presents for routine followup.  Since his last visit, he has been working out at [employer], has lost 10 pounds, trying to eat healthy. Overall he feels well with no chest pain. He does report having some strong heartbeats at nighttime, sometimes associated with vivid dreams.  Recent lab work shows total cholesterol 117, LDL 59, hemoglobin A1c 6.5  No side effects from his medications. He has not had a cardiac catheterization or stent since his bypass surgery and reports that since the surgery, he has done well.  EKG today shows sinus bradycardia with rate 59 beats per minute, no significant ST or T wave changes   Outpatient Encounter Prescriptions as of 06/08/2013  Medication Sig Dispense Refill  . amLODipine (NORVASC) 10 MG tablet take 1 tablet by mouth once daily  30 tablet  6  . aspirin 325 MG tablet Take 325 mg by mouth daily.        Marland Kitchen atorvastatin (LIPITOR) 80 MG tablet take 1 tablet by mouth once daily  30 tablet  2  . fish oil-omega-3 fatty acids 1000 MG capsule Take 2 g by mouth daily.        Marland Kitchen losartan (COZAAR) 50 MG tablet take 1 tablet by mouth once daily  30 tablet  6  . metFORMIN (GLUCOPHAGE-XR) 500 MG 24 hr tablet take 2 tablets by mouth once daily with BREAKFAST  60 tablet  2  . metoprolol (LOPRESSOR) 50 MG tablet take 1 tablet by mouth twice a day  60 tablet  5  . Multiple Vitamin (MULTIVITAMIN) tablet Take 1 tablet by mouth daily.        . [DISCONTINUED] BL Lancets Thin MISC 1 cartridge by Does not apply route 2 (two) times daily.  100 each  11   No facility-administered encounter medications on file as of 06/08/2013.     Review  of Systems  Constitutional: Negative.   HENT: Negative.   Eyes: Negative.   Respiratory: Negative.   Cardiovascular: Negative.   Gastrointestinal: Negative.   Endocrine: Negative.   Musculoskeletal: Negative.   Skin: Negative.   Allergic/Immunologic: Negative.   Neurological: Negative.   Hematological: Negative.   Psychiatric/Behavioral: Negative.   All other systems reviewed and are negative.    BP 138/84  Pulse 59  Ht 5\' 8"  (1.727 m)  Wt 181 lb 12 oz (82.441 kg)  BMI 27.64 kg/m2  Physical Exam  Nursing note and vitals reviewed. Constitutional: He is oriented to person, place, and time. He appears well-developed and well-nourished.  HENT:  Head: Normocephalic.  Nose: Nose normal.  Mouth/Throat: Oropharynx is clear and moist.  Eyes: Conjunctivae are normal. Pupils are equal, round, and reactive to light.  Neck: Normal range of motion. Neck supple. No JVD present.  Cardiovascular: Normal rate, regular rhythm, S1 normal, S2 normal, normal heart sounds and intact distal pulses.  Exam reveals no gallop and no friction rub.   No murmur heard. Pulmonary/Chest: Effort normal and breath sounds normal. No respiratory distress. He has no wheezes. He has no rales. He exhibits no tenderness.  Abdominal: Soft. Bowel sounds are normal. He exhibits no distension. There is no tenderness.  Musculoskeletal:  Normal range of motion. He exhibits no edema and no tenderness.  Lymphadenopathy:    He has no cervical adenopathy.  Neurological: He is alert and oriented to person, place, and time. Coordination normal.  Skin: Skin is warm and dry. No rash noted. No erythema.  Psychiatric: He has a normal mood and affect. His behavior is normal. Judgment and thought content normal.      Assessment and Plan

## 2013-07-27 ENCOUNTER — Other Ambulatory Visit: Payer: Self-pay | Admitting: Family Medicine

## 2013-11-16 ENCOUNTER — Other Ambulatory Visit: Payer: Self-pay | Admitting: Family Medicine

## 2013-12-07 ENCOUNTER — Encounter: Payer: Self-pay | Admitting: Cardiovascular Disease

## 2013-12-07 ENCOUNTER — Ambulatory Visit (INDEPENDENT_AMBULATORY_CARE_PROVIDER_SITE_OTHER): Payer: Commercial Managed Care - HMO | Admitting: Cardiovascular Disease

## 2013-12-07 VITALS — BP 130/72 | HR 54 | Ht 68.0 in | Wt 183.8 lb

## 2013-12-07 DIAGNOSIS — IMO0001 Reserved for inherently not codable concepts without codable children: Secondary | ICD-10-CM

## 2013-12-07 DIAGNOSIS — F1010 Alcohol abuse, uncomplicated: Secondary | ICD-10-CM

## 2013-12-07 DIAGNOSIS — I25810 Atherosclerosis of coronary artery bypass graft(s) without angina pectoris: Secondary | ICD-10-CM

## 2013-12-07 DIAGNOSIS — IMO0002 Reserved for concepts with insufficient information to code with codable children: Secondary | ICD-10-CM

## 2013-12-07 DIAGNOSIS — E1165 Type 2 diabetes mellitus with hyperglycemia: Secondary | ICD-10-CM

## 2013-12-07 DIAGNOSIS — E785 Hyperlipidemia, unspecified: Secondary | ICD-10-CM

## 2013-12-07 DIAGNOSIS — I10 Essential (primary) hypertension: Secondary | ICD-10-CM

## 2013-12-07 NOTE — Assessment & Plan Note (Signed)
Currently with no symptoms of angina. No further workup at this time. Continue current medication regimen. 

## 2013-12-07 NOTE — Assessment & Plan Note (Signed)
Blood pressure is well controlled on today's visit. No changes made to the medications. 

## 2013-12-07 NOTE — Assessment & Plan Note (Signed)
We have encouraged continued exercise, careful diet management in an effort to lose weight. 

## 2013-12-07 NOTE — Assessment & Plan Note (Addendum)
Long discussion about his alcohol intake.Encouraged him to cut back on his alcohol intake.especially as he is taking a statin

## 2013-12-07 NOTE — Assessment & Plan Note (Signed)
Cholesterol is at goal on the current lipid regimen. No changes to the medications were made.  

## 2013-12-07 NOTE — Progress Notes (Signed)
Patient ID: [name], male    DOB: [birth year], 68 y.o.   MRN: [id number]  HPI Comments: [name] is a very pleasant 68 yo gentleman with remote history of coronary artery disease, bypass surgery in 2006 x5 vessel after he developed anginal symptoms with pain radiating to his jaw with exertion, hyperlipidemia, diabetes, hypertension, who presents for routine followup.  In followup, he reports that he is doing well. He is alternating Lipitor 80 mg with 40 mg. He is active, goes to the gym, denies any chest pain or shortness of breath with exertion He does report that he drinks significant alcohol 3-4 drinks every other night. Hemoglobin A1c 6.5, total cholesterol 117 with LDL 59  No side effects from his medications. He has not had a cardiac catheterization or stent since his bypass surgery and reports that since the surgery  EKG today shows sinus bradycardia with rate 54 beats per minute, old inferior MI, nonspecific ST abnormality   Outpatient Encounter Prescriptions as of 12/07/2013  Medication Sig  . amLODipine (NORVASC) 10 MG tablet take 1 tablet by mouth once daily  . aspirin 325 MG tablet Take 325 mg by mouth daily.    Marland Kitchen atorvastatin (LIPITOR) 80 MG tablet take 1 tablet by mouth once daily  . fish oil-omega-3 fatty acids 1000 MG capsule Take 2 g by mouth daily.    Marland Kitchen losartan (COZAAR) 50 MG tablet take 1 tablet by mouth once daily  . metFORMIN (GLUCOPHAGE-XR) 500 MG 24 hr tablet take 2 tablets by mouth once daily WITH BREAKFAST  . metoprolol (LOPRESSOR) 50 MG tablet take 1 tablet by mouth twice a day  . Multiple Vitamin (MULTIVITAMIN) tablet Take 1 tablet by mouth daily.       Review of Systems  Constitutional: Negative.   HENT: Negative.   Eyes: Negative.   Respiratory: Negative.   Cardiovascular: Negative.   Gastrointestinal: Negative.   Endocrine: Negative.   Musculoskeletal: Negative.   Skin: Negative.   Allergic/Immunologic: Negative.   Neurological: Negative.    Hematological: Negative.   Psychiatric/Behavioral: Negative.   All other systems reviewed and are negative.   BP 130/72  Pulse 54  Ht 5\' 8"  (1.727 m)  Wt 183 lb 12 oz (83.348 kg)  BMI 27.95 kg/m2  Physical Exam  Nursing note and vitals reviewed. Constitutional: He is oriented to person, place, and time. He appears well-developed and well-nourished.  HENT:  Head: Normocephalic.  Nose: Nose normal.  Mouth/Throat: Oropharynx is clear and moist.  Eyes: Conjunctivae are normal. Pupils are equal, round, and reactive to light.  Neck: Normal range of motion. Neck supple. No JVD present.  Cardiovascular: Normal rate, regular rhythm, S1 normal, S2 normal, normal heart sounds and intact distal pulses.  Exam reveals no gallop and no friction rub.   No murmur heard. Pulmonary/Chest: Effort normal and breath sounds normal. No respiratory distress. He has no wheezes. He has no rales. He exhibits no tenderness.  Abdominal: Soft. Bowel sounds are normal. He exhibits no distension. There is no tenderness.  Musculoskeletal: Normal range of motion. He exhibits no edema and no tenderness.  Lymphadenopathy:    He has no cervical adenopathy.  Neurological: He is alert and oriented to person, place, and time. Coordination normal.  Skin: Skin is warm and dry. No rash noted. No erythema.  Psychiatric: He has a normal mood and affect. His behavior is normal. Judgment and thought content normal.      Assessment and Plan

## 2013-12-07 NOTE — Patient Instructions (Signed)
You are doing well. No medication changes were made.  Acid blocker: omeprazole (prevacid) one a day  Please call us if you have new issues that need to be addressed before your next appt.  Your physician wants you to follow-up in: 6 months.  You will receive a reminder letter in the mail two months in advance. If you don't receive a letter, please call our office to schedule the follow-up appointment.

## 2014-01-10 ENCOUNTER — Other Ambulatory Visit: Payer: Self-pay | Admitting: Family Medicine

## 2014-02-01 ENCOUNTER — Ambulatory Visit (INDEPENDENT_AMBULATORY_CARE_PROVIDER_SITE_OTHER): Payer: Commercial Managed Care - HMO | Admitting: Family Medicine

## 2014-02-01 ENCOUNTER — Encounter: Payer: Self-pay | Admitting: Family Medicine

## 2014-02-01 VITALS — BP 150/82 | HR 60 | Temp 98.0°F | Wt 183.2 lb

## 2014-02-01 DIAGNOSIS — M549 Dorsalgia, unspecified: Secondary | ICD-10-CM

## 2014-02-01 DIAGNOSIS — B9789 Other viral agents as the cause of diseases classified elsewhere: Secondary | ICD-10-CM

## 2014-02-01 DIAGNOSIS — S335XXA Sprain of ligaments of lumbar spine, initial encounter: Secondary | ICD-10-CM

## 2014-02-01 DIAGNOSIS — S39012A Strain of muscle, fascia and tendon of lower back, initial encounter: Secondary | ICD-10-CM

## 2014-02-01 DIAGNOSIS — J069 Acute upper respiratory infection, unspecified: Secondary | ICD-10-CM | POA: Insufficient documentation

## 2014-02-01 LAB — POCT URINALYSIS DIPSTICK
Bilirubin, UA: NEGATIVE
Blood, UA: NEGATIVE
Glucose, UA: NEGATIVE
Ketones, UA: NEGATIVE
LEUKOCYTES UA: NEGATIVE
Nitrite, UA: NEGATIVE
PH UA: 6
PROTEIN UA: NEGATIVE
Spec Grav, UA: 1.01
Urobilinogen, UA: 0.2

## 2014-02-01 MED ORDER — CYCLOBENZAPRINE HCL 5 MG PO TABS: 5.0000 mg | ORAL_TABLET | Freq: Two times a day (BID) | ORAL | Status: DC | PRN

## 2014-02-01 NOTE — Assessment & Plan Note (Signed)
Anticipate R lower back strain - supportive care as per instructions - heating pad, rest, ibuporfen and flexeril (with sedation precautions) Update if not improving as expected.

## 2014-02-01 NOTE — Progress Notes (Signed)
Pre visit review using our clinic review tool, if applicable. No additional management support is needed unless otherwise documented below in the visit note. 

## 2014-02-01 NOTE — Patient Instructions (Signed)
I think you have viral upper respiratory infection that should resolve well with time. For R sided pain - I think this was pulled muscle. Continue heating pad and may try muscle relaxant flexeril 5mg  as needed (sedation precautions). Good to see you today, call us with questions.

## 2014-02-01 NOTE — Progress Notes (Signed)
BP 150/82  Pulse 60  Temp(Src) 98 F (36.7 C) (Oral)  Wt 183 lb 4 oz (83.122 kg)   CC: ?URI with back pain Subjective:    Patient ID: [name], male    DOB: [birth year], 68 y.o.   MRN: [id number]  HPI: [name] is a 68 y.o. male presenting on 02/01/2014 for Back Pain and URI   Pleasant 68 yo with h/o DM, HTN, HLD presents with intermittent R back pain ongoing for the last week described as tightness worse with turning in bed. Ibuprofen helps. Drinking plenty of water.  Also presents with sxs of URI that have started 1+ wk ago, seems to be improving.  + congestion, cough (productive), ST. Denies inciting trauma/injury, no heavy lifting.  No fevers/chills, abd pain, ear or tooth pain, dysuria, urgency, frequency, hematuria.  No sick contacts at home. No smokers at home. No h/o COPD or asthma.  Lab Results  Component Value Date   HGBA1C 6.5 05/25/2013    Relevant past medical, surgical, family and social history reviewed and updated as indicated.  Allergies and medications reviewed and updated. Current Outpatient Prescriptions on File Prior to Visit  Medication Sig  . amLODipine (NORVASC) 10 MG tablet take 1 tablet by mouth once daily  . aspirin 325 MG tablet Take 325 mg by mouth daily.    Marland Kitchen atorvastatin (LIPITOR) 80 MG tablet take 1 tablet by mouth once daily  . fish oil-omega-3 fatty acids 1000 MG capsule Take 2 g by mouth daily.    Marland Kitchen losartan (COZAAR) 50 MG tablet take 1 tablet by mouth once daily  . metFORMIN (GLUCOPHAGE-XR) 500 MG 24 hr tablet take 2 tablets by mouth once daily with BREAKFAST  . metoprolol (LOPRESSOR) 50 MG tablet take 1 tablet by mouth twice a day  . Multiple Vitamin (MULTIVITAMIN) tablet Take 1 tablet by mouth daily.     No current facility-administered medications on file prior to visit.    Review of Systems Per HPI unless specifically indicated above    Objective:    BP 150/82  Pulse 60  Temp(Src) 98 F (36.7 C) (Oral)  Wt 183 lb  4 oz (83.122 kg)  Physical Exam  Nursing note and vitals reviewed. Constitutional: He appears well-developed and well-nourished. No distress.  HENT:  Head: Normocephalic and atraumatic.  Right Ear: Hearing, tympanic membrane, external ear and ear canal normal.  Left Ear: Hearing, tympanic membrane, external ear and ear canal normal.  Nose: Nose normal. No mucosal edema or rhinorrhea. Right sinus exhibits no maxillary sinus tenderness and no frontal sinus tenderness. Left sinus exhibits no maxillary sinus tenderness and no frontal sinus tenderness.  Mouth/Throat: Uvula is midline, oropharynx is clear and moist and mucous membranes are normal. No oropharyngeal exudate, posterior oropharyngeal edema, posterior oropharyngeal erythema or tonsillar abscesses.  Eyes: Conjunctivae and EOM are normal. Pupils are equal, round, and reactive to light. No scleral icterus.  Neck: Normal range of motion. Neck supple.  Cardiovascular: Normal rate, regular rhythm, normal heart sounds and intact distal pulses.   No murmur heard. Pulmonary/Chest: Effort normal and breath sounds normal. No respiratory distress. He has no wheezes. He has no rales.  Abdominal: Soft. Normal appearance and bowel sounds are normal. He exhibits no distension and no mass. There is no hepatosplenomegaly. There is no tenderness. There is no rigidity, no rebound, no guarding, no CVA tenderness and negative Murphy's sign.  Musculoskeletal: He exhibits no edema.  No midline spine tenderness No significant tenderness  at right back but reproduced with sitting up from supine position.  Lymphadenopathy:    He has no cervical adenopathy.  Skin: Skin is warm and dry. No rash noted.   Results for orders placed in visit on 02/01/14  POCT URINALYSIS DIPSTICK      Result Value Ref Range   Color, UA Yellow     Clarity, UA Clear     Glucose, UA Negative     Bilirubin, UA Negative     Ketones, UA Negative     Spec Grav, UA 1.010     Blood, UA  Negative     pH, UA 6.0     Protein, UA Negative     Urobilinogen, UA 0.2     Nitrite, UA Negative     Leukocytes, UA Negative        Assessment & Plan:   Problem List Items Addressed This Visit   Viral URI with cough     Supportive care. Improving with time.    Low back strain - Primary     Anticipate R lower back strain - supportive care as per instructions - heating pad, rest, ibuporfen and flexeril (with sedation precautions) Update if not improving as expected.     Other Visit Diagnoses   Back pain        Relevant Medications       cyclobenzaprine (FLEXERIL) tablet    Other Relevant Orders       POCT Urinalysis Dipstick (Completed)        Follow up plan: Return if symptoms worsen or fail to improve.

## 2014-02-01 NOTE — Assessment & Plan Note (Signed)
Supportive care. Improving with time.

## 2014-02-17 ENCOUNTER — Other Ambulatory Visit: Payer: Self-pay | Admitting: Family Medicine

## 2014-05-06 ENCOUNTER — Encounter: Payer: Self-pay | Admitting: Internal Medicine

## 2014-05-13 ENCOUNTER — Other Ambulatory Visit: Payer: Self-pay | Admitting: Family Medicine

## 2014-05-14 ENCOUNTER — Other Ambulatory Visit: Payer: Self-pay | Admitting: Family Medicine

## 2014-05-14 DIAGNOSIS — Z125 Encounter for screening for malignant neoplasm of prostate: Secondary | ICD-10-CM

## 2014-05-14 DIAGNOSIS — E785 Hyperlipidemia, unspecified: Secondary | ICD-10-CM

## 2014-05-14 DIAGNOSIS — E119 Type 2 diabetes mellitus without complications: Secondary | ICD-10-CM

## 2014-05-14 DIAGNOSIS — Z79899 Other long term (current) drug therapy: Secondary | ICD-10-CM

## 2014-05-17 ENCOUNTER — Other Ambulatory Visit: Payer: Commercial Managed Care - HMO

## 2014-05-17 ENCOUNTER — Other Ambulatory Visit: Payer: Self-pay | Admitting: Family Medicine

## 2014-05-17 DIAGNOSIS — E1059 Type 1 diabetes mellitus with other circulatory complications: Secondary | ICD-10-CM

## 2014-05-17 DIAGNOSIS — Z79899 Other long term (current) drug therapy: Secondary | ICD-10-CM

## 2014-05-17 DIAGNOSIS — E785 Hyperlipidemia, unspecified: Secondary | ICD-10-CM

## 2014-05-17 DIAGNOSIS — Z125 Encounter for screening for malignant neoplasm of prostate: Secondary | ICD-10-CM

## 2014-05-18 ENCOUNTER — Other Ambulatory Visit (INDEPENDENT_AMBULATORY_CARE_PROVIDER_SITE_OTHER): Payer: Commercial Managed Care - HMO

## 2014-05-18 DIAGNOSIS — E119 Type 2 diabetes mellitus without complications: Secondary | ICD-10-CM

## 2014-05-18 DIAGNOSIS — Z125 Encounter for screening for malignant neoplasm of prostate: Secondary | ICD-10-CM

## 2014-05-18 DIAGNOSIS — Z79899 Other long term (current) drug therapy: Secondary | ICD-10-CM

## 2014-05-18 DIAGNOSIS — E785 Hyperlipidemia, unspecified: Secondary | ICD-10-CM

## 2014-05-18 LAB — LIPID PANEL
CHOLESTEROL: 126 mg/dL (ref 0–200)
HDL: 32.8 mg/dL — ABNORMAL LOW (ref >39.00)
LDL Cholesterol: 62 mg/dL (ref 0–99)
NonHDL: 93.2
TRIGLYCERIDES: 154 mg/dL — AB (ref 0.0–149.0)
Total CHOL/HDL Ratio: 4
VLDL: 30.8 mg/dL (ref 0.0–40.0)

## 2014-05-18 LAB — CBC WITH DIFFERENTIAL/PLATELET
BASOS PCT: 0.7 % (ref 0.0–3.0)
Basophils Absolute: 0 10*3/uL (ref 0.0–0.1)
EOS PCT: 5.4 % — AB (ref 0.0–5.0)
Eosinophils Absolute: 0.3 10*3/uL (ref 0.0–0.7)
HCT: 46 % (ref 39.0–52.0)
HEMOGLOBIN: 16 g/dL (ref 13.0–17.0)
Lymphocytes Relative: 31.7 % (ref 12.0–46.0)
Lymphs Abs: 1.7 10*3/uL (ref 0.7–4.0)
MCHC: 34.8 g/dL (ref 30.0–36.0)
MCV: 91.6 fl (ref 78.0–100.0)
MONO ABS: 0.5 10*3/uL (ref 0.1–1.0)
Monocytes Relative: 10 % (ref 3.0–12.0)
Neutro Abs: 2.9 10*3/uL (ref 1.4–7.7)
Neutrophils Relative %: 52.2 % (ref 43.0–77.0)
Platelets: 167 10*3/uL (ref 150.0–400.0)
RBC: 5.02 Mil/uL (ref 4.22–5.81)
RDW: 12.8 % (ref 11.5–15.5)
WBC: 5.5 10*3/uL (ref 4.0–10.5)

## 2014-05-18 LAB — BASIC METABOLIC PANEL
BUN: 14 mg/dL (ref 6–23)
CO2: 30 mEq/L (ref 19–32)
Calcium: 9.8 mg/dL (ref 8.4–10.5)
Chloride: 103 mEq/L (ref 96–112)
Creatinine, Ser: 1.3 mg/dL (ref 0.4–1.5)
GFR: 58.35 mL/min — AB (ref >60.00)
Glucose, Bld: 165 mg/dL — ABNORMAL HIGH (ref 70–99)
Potassium: 4.1 mEq/L (ref 3.5–5.1)
SODIUM: 138 meq/L (ref 135–145)

## 2014-05-18 LAB — HEPATIC FUNCTION PANEL
ALT: 44 U/L (ref 0–53)
AST: 27 U/L (ref 0–37)
Albumin: 4.5 g/dL (ref 3.5–5.2)
Alkaline Phosphatase: 43 U/L (ref 39–117)
Bilirubin, Direct: 0.2 mg/dL (ref 0.0–0.3)
TOTAL PROTEIN: 7.1 g/dL (ref 6.0–8.3)
Total Bilirubin: 1.2 mg/dL (ref 0.2–1.2)

## 2014-05-18 LAB — MICROALBUMIN / CREATININE URINE RATIO
CREATININE, U: 55.8 mg/dL
Microalb Creat Ratio: 0.5 mg/g (ref 0.0–30.0)
Microalb, Ur: 0.3 mg/dL (ref 0.0–1.9)

## 2014-05-18 LAB — HEMOGLOBIN A1C: Hgb A1c MFr Bld: 7 % — ABNORMAL HIGH (ref 4.6–6.5)

## 2014-05-18 LAB — PSA: PSA: 0.79 ng/mL (ref 0.10–4.00)

## 2014-05-24 ENCOUNTER — Ambulatory Visit (INDEPENDENT_AMBULATORY_CARE_PROVIDER_SITE_OTHER): Payer: Commercial Managed Care - HMO | Admitting: Family Medicine

## 2014-05-24 ENCOUNTER — Encounter: Payer: Self-pay | Admitting: Internal Medicine

## 2014-05-24 ENCOUNTER — Encounter: Payer: Self-pay | Admitting: Family Medicine

## 2014-05-24 VITALS — BP 140/72 | HR 56 | Temp 98.3°F | Ht 66.34 in | Wt 183.8 lb

## 2014-05-24 DIAGNOSIS — Z86010 Personal history of colonic polyps: Secondary | ICD-10-CM

## 2014-05-24 DIAGNOSIS — Z1211 Encounter for screening for malignant neoplasm of colon: Secondary | ICD-10-CM

## 2014-05-24 DIAGNOSIS — E785 Hyperlipidemia, unspecified: Secondary | ICD-10-CM

## 2014-05-24 DIAGNOSIS — Z0000 Encounter for general adult medical examination without abnormal findings: Secondary | ICD-10-CM

## 2014-05-24 DIAGNOSIS — E1165 Type 2 diabetes mellitus with hyperglycemia: Secondary | ICD-10-CM

## 2014-05-24 DIAGNOSIS — IMO0002 Reserved for concepts with insufficient information to code with codable children: Secondary | ICD-10-CM

## 2014-05-24 DIAGNOSIS — K140 Glossitis: Secondary | ICD-10-CM

## 2014-05-24 DIAGNOSIS — I25810 Atherosclerosis of coronary artery bypass graft(s) without angina pectoris: Secondary | ICD-10-CM

## 2014-05-24 DIAGNOSIS — I10 Essential (primary) hypertension: Secondary | ICD-10-CM

## 2014-05-24 NOTE — Progress Notes (Signed)
Dr. Frederico Hamman T. Jabree Rebert, MD, [facility] [place], [address] Phone: [phone] Fax: [phone]  05/24/2014  Patient: [name], MRN: [id number], DOB: 05/14/1946, 68 y.o.  Primary Physician:  [name], MD  Chief Complaint: Annual Exam  Subjective:   [name] is a 68 y.o. pleasant patient who presents for a medicare wellness examination:  Preventative Health Maintenance Visit:  Health Maintenance Summary Reviewed and updated, unless pt declines services.  Tobacco History Reviewed. Alcohol: 2-5 drinks of liquor about 5 days a week Exercise Habits: Some activity, rec at least 30 mins 5 times a week STD concerns: no risk or activity to increase risk Drug Use: None Encouraged self-testicular check  Health Maintenance  Topic Date Due  . Foot Exam  05/20/1956  . Ophthalmology Exam  05/20/1956  . Zostavax  05/20/2006  . Hemoglobin A1c  11/16/2014  . Influenza Vaccine  03/21/2015  . Urine Microalbumin  05/19/2015  . Tetanus/tdap  04/13/2019  . Colonoscopy  06/11/2019  . Pneumococcal Polysaccharide Vaccine Age 17 And Over  Completed    Immunization History  Administered Date(s) Administered  . Influenza Split 08/02/2011, 06/11/2012  . Influenza Whole 08/10/2010  . Influenza, High Dose Seasonal PF 05/18/2014  . Influenza,inj,Quad PF,36+ Mos 05/25/2013  . Pneumococcal Polysaccharide-23 10/11/2011  . Td 08/20/1993, 04/12/2009   HTN: Tolerating all medications without side effects Stable and at goal No CP, no sob. No HA.  BP Readings from Last 3 Encounters:  05/24/14 140/72  02/01/14 150/82  12/07/13 540/08    Basic Metabolic Panel:    Component Value Date/Time   NA 138 05/18/2014 1008   K 4.1 05/18/2014 1008   CL 103 05/18/2014 1008   CO2 30 05/18/2014 1008   BUN 14 05/18/2014 1008   CREATININE 1.3 05/18/2014 1008   GLUCOSE 165* 05/18/2014 1008   CALCIUM 9.8 05/18/2014 1008   Diabetes  Mellitus: Tolerating Medications: yes Compliance with diet: fair Exercise: minimal / intermittent Avg blood sugars at home: have been ok, 100-150 Foot problems: none Hypoglycemia: none No nausea, vomitting, blurred vision, polyuria.  Lab Results  Component Value Date   HGBA1C 7.0* 05/18/2014   HGBA1C 6.5 05/25/2013   HGBA1C 6.9* 12/03/2012   Lab Results  Component Value Date   MICROALBUR 0.3 05/18/2014   LDLCALC 62 05/18/2014   CREATININE 1.3 05/18/2014    Wt Readings from Last 3 Encounters:  05/24/14 183 lb 12 oz (83.348 kg)  02/01/14 183 lb 4 oz (83.122 kg)  12/07/13 183 lb 12 oz (83.348 kg)    Body mass index is 29.36 kg/(m^2).   Lipids: Doing well, stable. Tolerating meds fine with no SE. Panel reviewed with patient.  Lipids:    Component Value Date/Time   CHOL 126 05/18/2014 1008   TRIG 154.0* 05/18/2014 1008   HDL 32.80* 05/18/2014 1008   LDLDIRECT 65.1 12/03/2012 1039   VLDL 30.8 05/18/2014 1008   CHOLHDL 4 05/18/2014 1008    Lab Results  Component Value Date   ALT 44 05/18/2014   AST 27 05/18/2014   ALKPHOS 43 05/18/2014   BILITOT 1.2 05/18/2014    L tongue lesion has been present x 1 year.  Patient Active Problem List   Diagnosis Date Noted  . CAD (coronary artery disease), autologous vein bypass graft 10/11/2011    Priority: High  . Diabetes type 2, uncontrolled 04/13/2009    Priority: High  . HTN (hypertension)  Priority: Medium  . Hyperlipidemia LDL goal <70 05/13/2008    Priority: Medium  . Low back strain 02/01/2014  . ALCOHOL ABUSE 08/10/2010  . OTHER MALAISE AND FATIGUE 04/13/2009  . History of colonic polyps 03/12/2007  . DEPRESSION 12/03/2006   Past Medical History  Diagnosis Date  . HTN (hypertension) 10/03  . HLD (hyperlipidemia) 10/03  . DMII (diabetes mellitus, type 2) 10/03  . Depression   . CAD (coronary artery disease) 9/06    CABG x4  . Colon polyps   . Alcohol abuse, unspecified    Past Surgical History  Procedure  Laterality Date  . Coronary artery bypass graft  9/06    x4  . Tonsillectomy    . Esophagogastroduodenoscopy  7/07    polyp; mucosal abnml  . Colonoscopy      Hem. Polyp; Hi grade dysplasia  . Colonoscopy  7/08    polyps   History   Social History  . Marital Status: Married    Spouse Name: N/A    Number of Children: N/A  . Years of Education: N/A   Occupational History  . Not on file.   Social History Main Topics  . Smoking status: Former Research scientist (life sciences)  . Smokeless tobacco: Never Used     Comment: quit 36 years ago  . Alcohol Use: Yes     Comment: Regular  . Drug Use: No  . Sexual Activity: Not on file   Other Topics Concern  . Not on file   Social History Narrative   Cares for his wife who is disabled with dementia         Family History  Problem Relation Age of Onset  . Alcohol abuse Father   . Stroke Father   . Other Father     cardiac problems  . COPD Mother   . Alcohol abuse Brother   . Cirrhosis Brother     EtOH  . Anxiety disorder Brother    Allergies  Allergen Reactions  . Sulfonamide Derivatives     REACTION: unknown reaction    Medication list has been reviewed and updated.   General: Denies fever, chills, sweats. No significant weight loss. Eyes: Denies blurring,significant itching ENT: Denies earache, sore throat, and hoarseness. Cardiovascular: Denies chest pains, palpitations, dyspnea on exertion Respiratory: Denies cough, dyspnea at rest,wheeezing Breast: no concerns about lumps GI: Denies nausea, vomiting, diarrhea, constipation, change in bowel habits, abdominal pain, melena, hematochezia GU: Denies penile discharge, ED, urinary flow / outflow problems. No STD concerns. Musculoskeletal: Denies back pain, joint pain Derm: Denies rash, itching Neuro: Denies  paresthesias, frequent falls, frequent headaches Psych: Denies depression, anxiety Endocrine: Denies cold intolerance, heat intolerance, polydipsia Heme: Denies enlarged lymph  nodes Allergy: No hayfever  Objective:   BP 140/72  Pulse 56  Temp(Src) 98.3 F (36.8 C) (Oral)  Ht 5' 6.34" (1.685 m)  Wt 183 lb 12 oz (83.348 kg)  BMI 29.36 kg/m2  The patient completed a fall screen and PHQ-2 and PHQ-9 if necessary, which is documented in the EHR. The CMA/LPN/RN who assisted the patient verbally completed with them and documented results in [facility].   Hearing Screening   Method: Audiometry   _0  _1  _2  _3  _4  _5  _6   Right ear:   _7 40   Left ear:   _8 Visual Acuity Screening   Right eye Left eye Both eyes  Without correction: _9 15  With correction:       GEN: well developed, well nourished, no acute distress Eyes: conjunctiva and lids normal, PERRLA, EOMI ENT: TM clear, nares clear, L sided tongue ulcer, approx 8 mm Neck: supple, no lymphadenopathy, no thyromegaly, no JVD Pulm: clear to auscultation and percussion, respiratory effort normal CV: regular rate and rhythm, S1-S2, no murmur, rub or gallop, no bruits, peripheral pulses normal and symmetric, no cyanosis, clubbing, edema or varicosities GI: soft, non-tender; no hepatosplenomegaly, masses; active bowel sounds all quadrants GU: no hernia, testicular mass, penile discharge Lymph: no cervical, axillary or inguinal adenopathy MSK: gait normal, muscle tone and strength WNL, no joint swelling, effusions, discoloration, crepitus  SKIN: clear, good turgor, color WNL, no rashes, lesions, or ulcerations Neuro: normal mental status, normal strength, sensation, and motion Psych: alert; oriented to person, place and time, normally interactive and not anxious or depressed in appearance.  All labs reviewed with patient.  Lipids:    Component Value Date/Time   CHOL 126 05/18/2014 1008   TRIG 154.0* 05/18/2014 1008   HDL 32.80* 05/18/2014 1008   LDLDIRECT 65.1 12/03/2012 1039   VLDL 30.8 05/18/2014 1008   CHOLHDL 4 05/18/2014 1008   CBC: CBC  Latest Ref Rng 05/18/2014 12/03/2012 10/08/2011  WBC 4.0 - 10.5 K/uL 5.5 5.6 4.7  Hemoglobin 13.0 - 17.0 g/dL 16.0 15.7 16.6  Hematocrit 39.0 - 52.0 % 46.0 45.8 48.4  Platelets 150.0 - 400.0 K/uL 167.0 182.0 258.5    Basic Metabolic Panel:    Component Value Date/Time   NA 138 05/18/2014 1008   K 4.1 05/18/2014 1008   CL 103 05/18/2014 1008   CO2 30 05/18/2014 1008   BUN 14 05/18/2014 1008   CREATININE 1.3 05/18/2014 1008   GLUCOSE 165* 05/18/2014 1008   CALCIUM 9.8 05/18/2014 1008   Hepatic Function Latest Ref Rng 05/18/2014 12/03/2012 10/08/2011  Total Protein 6.0 - 8.3 g/dL 7.1 6.8 7.1  Albumin 3.5 - 5.2 g/dL 4.5 4.4 4.3  AST 0 - 37 U/L _0 ALT 0 - 53 U/L 44 44 56(H)  Alk Phosphatase 39 - 117 U/L 43 43 79  Total Bilirubin 0.2 - 1.2 mg/dL 1.2 1.1 0.9  Bilirubin, Direct 0.0 - 0.3 mg/dL 0.2 0.1 0.2    Lab Results  Component Value Date   TSH 1.67 08/10/2010   Lab Results  Component Value Date   PSA 0.79 05/18/2014   PSA 1.02 12/03/2012   PSA 1.08 10/08/2011    Assessment and Plan:   Health care maintenance  Tongue ulcer - Plan: Ambulatory referral to ENT: tongue ulcer > 1 year on the left side in a former smoker and almost daily drinker. Consult ENT for opinion.   History of colonic polyps - Plan: Ambulatory referral to Gastroenterology  Special screening for malignant neoplasms, colon - Plan: Ambulatory referral to Gastroenterology: Colon recall.  Diabetes type 2, uncontrolled: a1c is at 7. Keep working on diet and exercise.   Coronary artery disease involving autologous vein coronary bypass graft without angina pectoris: Work on your diet: Try to eat minimal fatty food and eat more fruits and vegetables. Anything fried is bad, cream, beef and pork fat are bad.  Exercise is incredibly important to heart health. Try to exercise for at least 30 minutes 5 - 6 times a week   Essential hypertension: stable  Hyperlipidemia LDL goal <70: Stable except low HDL, rec more  exercise  Health Maintenance Exam: The patient's preventative maintenance and recommended screening tests  for an annual wellness exam were reviewed in full today. Brought up to date unless services declined.  Counselled on the importance of diet, exercise, and its role in overall health and mortality. The patient's FH and SH was reviewed, including their home life, tobacco status, and drug and alcohol status.  I have personally reviewed the Medicare Annual Wellness questionnaire and have noted 1. The patient's medical and social history 2. Their use of alcohol, tobacco or illicit drugs 3. Their current medications and supplements 4. The patient's functional ability including ADL's, fall risks, home safety risks and hearing or visual             impairment. 5. Diet and physical activities 6. Evidence for depression or mood disorders  The patients weight, height, BMI and visual acuity have been recorded in the chart I have made referrals, counseling and provided education to the patient based review of the above and I have provided the pt with a written personalized care plan for preventive services.  I have provided the patient with a copy of your personalized plan for preventive services. Instructed to take the time to review along with their updated medication list.  F/u for prevnar  Follow-up: No Follow-up on file. Or follow-up in 1 year for complete physical examination  New Prescriptions   No medications on file   Orders Placed This Encounter  Procedures  . Ambulatory referral to ENT  . Ambulatory referral to Gastroenterology    Signed,  Frederico Hamman T. Scharlene Catalina, MD   Patient's Medications  New Prescriptions   No medications on file  Previous Medications   AMLODIPINE (NORVASC) 10 MG TABLET    take 1 tablet by mouth once daily   ASPIRIN 325 MG TABLET    Take 325 mg by mouth daily.     FISH OIL-OMEGA-3 FATTY ACIDS 1000 MG CAPSULE    Take 2 g by mouth daily.     LOSARTAN  (COZAAR) 50 MG TABLET    take 1 tablet by mouth once daily   METFORMIN (GLUCOPHAGE-XR) 500 MG 24 HR TABLET    take 2 tablet by mouth once daily with BREAKFAST   METOPROLOL (LOPRESSOR) 50 MG TABLET    take 1 tablet by mouth twice a day   MULTIPLE VITAMIN (MULTIVITAMIN) TABLET    Take 1 tablet by mouth daily.    Modified Medications   Modified Medication Previous Medication   ATORVASTATIN (LIPITOR) 80 MG TABLET atorvastatin (LIPITOR) 80 MG tablet      one tablet by mouth M,W,F and 1/2 tablet the other days    take 1 tablet by mouth once daily  Discontinued Medications   CYCLOBENZAPRINE (FLEXERIL) 5 MG TABLET    Take 1 tablet (5 mg total) by mouth 2 (two) times daily as needed for muscle spasms (sedation precautions).

## 2014-05-24 NOTE — Patient Instructions (Signed)

## 2014-05-24 NOTE — Progress Notes (Signed)
Pre visit review using our clinic review tool, if applicable. No additional management support is needed unless otherwise documented below in the visit note. 

## 2014-05-25 ENCOUNTER — Telehealth: Payer: Self-pay | Admitting: Family Medicine

## 2014-05-25 NOTE — Telephone Encounter (Signed)
emmi emailed °

## 2014-06-11 ENCOUNTER — Other Ambulatory Visit: Payer: Self-pay | Admitting: Family Medicine

## 2014-07-05 ENCOUNTER — Ambulatory Visit (AMBULATORY_SURGERY_CENTER): Payer: Self-pay | Admitting: *Deleted

## 2014-07-05 VITALS — Ht 67.5 in | Wt 185.0 lb

## 2014-07-05 DIAGNOSIS — Z86010 Personal history of colonic polyps: Secondary | ICD-10-CM

## 2014-07-05 NOTE — Progress Notes (Signed)
No egg or soy allergy. ewm No home 02 use. ewm No diet pills. ewm No problems  with past sedation. ewm emmi video to pt's e mail. ewm'

## 2014-07-19 ENCOUNTER — Encounter: Payer: Self-pay | Admitting: Internal Medicine

## 2014-07-19 ENCOUNTER — Ambulatory Visit (AMBULATORY_SURGERY_CENTER): Payer: Commercial Managed Care - HMO | Admitting: Internal Medicine

## 2014-07-19 VITALS — BP 117/66 | HR 52 | Temp 96.3°F | Resp 35 | Ht 67.5 in | Wt 185.0 lb

## 2014-07-19 DIAGNOSIS — D123 Benign neoplasm of transverse colon: Secondary | ICD-10-CM

## 2014-07-19 DIAGNOSIS — Z86010 Personal history of colonic polyps: Secondary | ICD-10-CM

## 2014-07-19 DIAGNOSIS — D120 Benign neoplasm of cecum: Secondary | ICD-10-CM

## 2014-07-19 LAB — GLUCOSE, CAPILLARY
Glucose-Capillary: 148 mg/dL — ABNORMAL HIGH (ref 70–99)
Glucose-Capillary: 163 mg/dL — ABNORMAL HIGH (ref 70–99)

## 2014-07-19 MED ORDER — SODIUM CHLORIDE 0.9 % IV SOLN: 500.0000 mL | INTRAVENOUS | Status: DC

## 2014-07-19 NOTE — Patient Instructions (Addendum)
I found and removed 4 polyps today. You also have a condition called diverticulosis - common and not usually a problem. Please read the handout provided.  I appreciate the opportunity to care for you. Gatha Mayer, MD, FACG   YOU HAD AN ENDOSCOPIC PROCEDURE TODAY AT [facility]: Refer to the procedure report that was given to you for any specific questions about what was found during the examination.  If the procedure report does not answer your questions, please call your gastroenterologist to clarify.  If you requested that your care partner not be given the details of your procedure findings, then the procedure report has been included in a sealed envelope for you to review at your convenience later.  YOU SHOULD EXPECT: Some feelings of bloating in the abdomen. Passage of more gas than usual.  Walking can help get rid of the air that was put into your GI tract during the procedure and reduce the bloating. If you had a lower endoscopy (such as a colonoscopy or flexible sigmoidoscopy) you may notice spotting of blood in your stool or on the toilet paper. If you underwent a bowel prep for your procedure, then you may not have a normal bowel movement for a few days.  DIET: Your first meal following the procedure should be a light meal and then it is ok to progress to your normal diet.  A half-sandwich or bowl of soup is an example of a good first meal.  Heavy or fried foods are harder to digest and may make you feel nauseous or bloated.  Likewise meals heavy in dairy and vegetables can cause extra gas to form and this can also increase the bloating.  Drink plenty of fluids but you should avoid alcoholic beverages for 24 hours.  ACTIVITY: Your care partner should take you home directly after the procedure.  You should plan to take it easy, moving slowly for the rest of the day.  You can resume normal activity the day after the procedure however you should NOT DRIVE or use heavy  machinery for 24 hours (because of the sedation medicines used during the test).    SYMPTOMS TO REPORT IMMEDIATELY: A gastroenterologist can be reached at any hour.  During normal business hours, 8:30 AM to 5:00 PM Monday through Friday, call [phone].  After hours and on weekends, please call the GI answering service at [phone] who will take a message and have the physician on call contact you.   Following lower endoscopy (colonoscopy or flexible sigmoidoscopy):  Excessive amounts of blood in the stool  Significant tenderness or worsening of abdominal pains  Swelling of the abdomen that is new, acute  Fever of 100F or higher  FOLLOW UP: If any biopsies were taken you will be contacted by phone or by letter within the next 1-3 weeks.  Call your gastroenterologist if you have not heard about the biopsies in 3 weeks.  Our staff will call the home number listed on your records the next business day following your procedure to check on you and address any questions or concerns that you may have at that time regarding the information given to you following your procedure. This is a courtesy call and so if there is no answer at the home number and we have not heard from you through the emergency physician on call, we will assume that you have returned to your regular daily activities without incident.  SIGNATURES/CONFIDENTIALITY: You and/or your care partner have  signed paperwork which will be entered into your electronic medical record.  These signatures attest to the fact that that the information above on your After Visit Summary has been reviewed and is understood.  Full responsibility of the confidentiality of this discharge information lies with you and/or your care-partner.  HOLD ALL ASPIRIN AND NSAIDS FOR TWO WEEKS PER DR. Carlean Purl TO PREVENT BLEEDING. Read all of the handouts given to you by your recovery room nurse.

## 2014-07-19 NOTE — Op Note (Signed)
[facility]  [address] [place], [id number]   COLONOSCOPY PROCEDURE REPORT  PATIENT: [name], [name]  MR#: [id number] BIRTHDATE: 03/29/1946 , 68  yrs. old GENDER: male ENDOSCOPIST: Gatha Mayer, MD, [facility] PROCEDURE DATE:  07/19/2014 PROCEDURE:   Colonoscopy with snare polypectomy First Screening Colonoscopy - Avg.  risk and is 50 yrs.  old or older - No.  Prior Negative Screening - Now for repeat screening. N/A  History of Adenoma - Now for follow-up colonoscopy & has been > or = to 3 yrs.  Yes hx of adenoma.  Has been 3 or more years since last colonoscopy.  Polyps Removed Today? Yes. ASA CLASS:   Class III INDICATIONS:surveillance colonoscopy based on a history of adenomatous colonic polyp(s). MEDICATIONS: Propofol 300 mg IV and Monitored anesthesia care  DESCRIPTION OF PROCEDURE:   After the risks benefits and alternatives of the procedure were thoroughly explained, informed consent was obtained.  The digital rectal exam revealed no abnormalities of the rectum, revealed no prostatic nodules, and revealed the prostate was not enlarged.   The LB [id number] endoscope was introduced through the anus and advanced to the cecum, which was identified by both the appendix and ileocecal valve. No adverse events experienced.   The quality of the prep was excellent, using MiraLax  The instrument was then slowly withdrawn as the colon was fully examined.      COLON FINDINGS: 4 polyps removed.  12 mm cecal polyp with hot snare. 3 transverse polyps 5 - 7 mm removed cold snare.  All completely removed and sent to pathology.  Sigmoid diverticulosis seen also. Otherwise normal colonoscopy.  Retroflexed views revealed no abnormalities. The time to cecum=3 minutes 16 seconds.  Withdrawal time=14 minutes 48 seconds.  The scope was withdrawn and the procedure completed. COMPLICATIONS: There were no immediate complications.  ENDOSCOPIC IMPRESSION: 4 polyps removed.   12 mm cecal polyp with hot snare.  3 transverse polyps 5 - 7 mm removed cold snare.  All completely removed and sent to pathology.  Sigmoid diverticulosis seen also.  Otherwise normal colonoscopy  RECOMMENDATIONS: Hold Aspirin and all other NSAIDS for 2 weeks.  eSigned:  Gatha Mayer, MD, [facility] 07/19/2014 11:21 AM   cc: The Patient

## 2014-07-19 NOTE — Progress Notes (Signed)
Called to room to assist during endoscopic procedure.  Patient ID and intended procedure confirmed with present staff. Received instructions for my participation in the procedure from the performing physician.  

## 2014-07-19 NOTE — Progress Notes (Signed)
Pt stable to RR 

## 2014-07-20 ENCOUNTER — Telehealth: Payer: Self-pay

## 2014-07-20 NOTE — Telephone Encounter (Signed)
  Follow up Call-  Call back number 07/19/2014  Post procedure Call Back phone  # [phone] cell  Permission to leave phone message Yes     Patient questions:  Do you have a fever, pain , or abdominal swelling? No. Pain Score  0 *  Have you tolerated food without any problems? Yes.    Have you been able to return to your normal activities? Yes.    Do you have any questions about your discharge instructions: Diet   No. Medications  No. Follow up visit  No.  Do you have questions or concerns about your Care? No.  Actions: * If pain score is 4 or above: No action needed, pain <4.

## 2014-07-22 ENCOUNTER — Encounter: Payer: Self-pay | Admitting: Internal Medicine

## 2014-07-22 ENCOUNTER — Encounter: Payer: Self-pay | Admitting: Cardiovascular Disease

## 2014-07-22 ENCOUNTER — Ambulatory Visit (INDEPENDENT_AMBULATORY_CARE_PROVIDER_SITE_OTHER): Payer: Commercial Managed Care - HMO | Admitting: Cardiovascular Disease

## 2014-07-22 VITALS — BP 163/82 | HR 64 | Ht 68.0 in | Wt 185.8 lb

## 2014-07-22 DIAGNOSIS — F1010 Alcohol abuse, uncomplicated: Secondary | ICD-10-CM

## 2014-07-22 DIAGNOSIS — I25810 Atherosclerosis of coronary artery bypass graft(s) without angina pectoris: Secondary | ICD-10-CM

## 2014-07-22 DIAGNOSIS — E785 Hyperlipidemia, unspecified: Secondary | ICD-10-CM

## 2014-07-22 DIAGNOSIS — E1165 Type 2 diabetes mellitus with hyperglycemia: Secondary | ICD-10-CM

## 2014-07-22 DIAGNOSIS — R12 Heartburn: Secondary | ICD-10-CM

## 2014-07-22 DIAGNOSIS — IMO0002 Reserved for concepts with insufficient information to code with codable children: Secondary | ICD-10-CM

## 2014-07-22 DIAGNOSIS — I10 Essential (primary) hypertension: Secondary | ICD-10-CM

## 2014-07-22 NOTE — Patient Instructions (Signed)
You are doing well. No medication changes were made.  Please call us if you have new issues that need to be addressed before your next appt.  Your physician wants you to follow-up in: 6 months.  You will receive a reminder letter in the mail two months in advance. If you don't receive a letter, please call our office to schedule the follow-up appointment.   

## 2014-07-22 NOTE — Progress Notes (Signed)
Quick Note:  4 adenomas max 12 mm  Repeat colonoscopy 2018 ______

## 2014-07-22 NOTE — Progress Notes (Signed)
Patient ID: [name], male    DOB: 1946/06/04, 68 y.o.   MRN: [id number]  HPI Comments: [name] is a very pleasant 68 yo gentleman with remote history of coronary artery disease, bypass surgery in 2006 x5 vessel after he developed anginal symptoms with pain radiating to his jaw with exertion, hyperlipidemia, diabetes, hypertension, who presents for routine followup of his coronary artery disease  In follow-up today, he reports that he has been having significant heartburn. He is taking 325 Tums a day. He describes his symptoms as a burning sensation, sometimes worse after eating. Tums immediately helps his symptoms but only for a short period of time. He denies any anginal symptoms. No jaw pain or previous anginal equivalent symptoms Blood pressure at home typically running 627 up to 035 systolic over 85. He has an old blood pressure cuff  Lab work reviewed with him, labs dated 05/18/2014 showing total cholesterol 126, LDL 62, hemoglobin A1c 7.0 He reports that he is alternating Lipitor 80 mg with 40 mg He does report that he drinks significant alcohol 3-4 drinks every other night.  EKG on today's visit shows normal sinus rhythm with no significant ST or T-wave changes  Other past medical history  He has not had a cardiac catheterization or stent since his bypass surgery and reports that since the surgery    Outpatient Encounter Prescriptions as of 07/22/2014  Medication Sig  . amLODipine (NORVASC) 10 MG tablet take 1 tablet by mouth once daily  . aspirin 325 MG tablet Take 325 mg by mouth daily.    Marland Kitchen atorvastatin (LIPITOR) 80 MG tablet take 1 tablet by mouth once daily  . fish oil-omega-3 fatty acids 1000 MG capsule Take 2 g by mouth daily.    Marland Kitchen losartan (COZAAR) 50 MG tablet take 1 tablet by mouth once daily  . metFORMIN (GLUCOPHAGE-XR) 500 MG 24 hr tablet take 2 tablets by mouth once daily WITH BREAKFAST  . metoprolol (LOPRESSOR) 50 MG tablet take 1 tablet by mouth twice a day   . Multiple Vitamin (MULTIVITAMIN) tablet Take 1 tablet by mouth daily.     Social history  reports that he has quit smoking. He has never used smokeless tobacco. He reports that he drinks about 9.0 oz of alcohol per week. He reports that he does not use illicit drugs.  Review of Systems  Constitutional: Negative.   Respiratory: Negative.   Cardiovascular: Negative.   Gastrointestinal: Negative.        Heartburn  Musculoskeletal: Negative.   Neurological: Negative.   Hematological: Negative.   Psychiatric/Behavioral: Negative.   All other systems reviewed and are negative.   BP 163/82 mmHg  Pulse 64  Ht 5\' 8"  (1.727 m)  Wt 185 lb 12 oz (84.256 kg)  BMI 28.25 kg/m2  Physical Exam  Constitutional: He is oriented to person, place, and time. He appears well-developed and well-nourished.  HENT:  Head: Normocephalic.  Nose: Nose normal.  Mouth/Throat: Oropharynx is clear and moist.  Eyes: Conjunctivae are normal. Pupils are equal, round, and reactive to light.  Neck: Normal range of motion. Neck supple. No JVD present.  Cardiovascular: Normal rate, regular rhythm, S1 normal, S2 normal, normal heart sounds and intact distal pulses.  Exam reveals no gallop and no friction rub.   No murmur heard. Pulmonary/Chest: Effort normal and breath sounds normal. No respiratory distress. He has no wheezes. He has no rales. He exhibits no tenderness.  Abdominal: Soft. Bowel sounds are normal. He exhibits no  distension. There is no tenderness.  Musculoskeletal: Normal range of motion. He exhibits no edema or tenderness.  Lymphadenopathy:    He has no cervical adenopathy.  Neurological: He is alert and oriented to person, place, and time. Coordination normal.  Skin: Skin is warm and dry. No rash noted. No erythema.  Psychiatric: He has a normal mood and affect. His behavior is normal. Judgment and thought content normal.      Assessment and Plan   Nursing note and vitals reviewed.

## 2014-07-23 NOTE — Assessment & Plan Note (Signed)
Cholesterol is at goal on the current lipid regimen. No changes to the medications were made.  

## 2014-07-23 NOTE — Assessment & Plan Note (Signed)
Hemoglobin A1c 7.0, up from previous level of 6.5. Recommended he watch his diet, increase his exercise

## 2014-07-23 NOTE — Assessment & Plan Note (Signed)
Currently with no symptoms of angina. No further workup at this time. Continue current medication regimen. 

## 2014-07-23 NOTE — Assessment & Plan Note (Signed)
Blood pressure elevated on today's visit. Recommended he closely monitor his blood pressure at home. Bring in his blood pressure cuff to correlate with our manual cuff

## 2014-07-23 NOTE — Assessment & Plan Note (Signed)
Recommended alcohol in moderation. Discussed possible complications from alcohol including cardiomyopathy, liver disease

## 2015-04-08 ENCOUNTER — Telehealth: Payer: Self-pay | Admitting: Family Medicine

## 2015-04-08 NOTE — Telephone Encounter (Signed)
cpx 10/10 Labs 10/6 Pt aware Please close

## 2015-04-08 NOTE — Telephone Encounter (Signed)
Please call and schedule CPE with fasting labs prior with Dr. Lorelei Pont for sometime after 05/25/2015.

## 2015-04-08 NOTE — Telephone Encounter (Signed)
Left message asking pt to call office  °

## 2015-04-13 ENCOUNTER — Ambulatory Visit (INDEPENDENT_AMBULATORY_CARE_PROVIDER_SITE_OTHER): Payer: Commercial Managed Care - HMO | Admitting: Cardiovascular Disease

## 2015-04-13 ENCOUNTER — Encounter: Payer: Self-pay | Admitting: Cardiovascular Disease

## 2015-04-13 ENCOUNTER — Encounter (INDEPENDENT_AMBULATORY_CARE_PROVIDER_SITE_OTHER): Payer: Self-pay

## 2015-04-13 VITALS — BP 128/68 | HR 66 | Ht 68.0 in | Wt 185.8 lb

## 2015-04-13 DIAGNOSIS — E1165 Type 2 diabetes mellitus with hyperglycemia: Secondary | ICD-10-CM

## 2015-04-13 DIAGNOSIS — I25810 Atherosclerosis of coronary artery bypass graft(s) without angina pectoris: Secondary | ICD-10-CM

## 2015-04-13 DIAGNOSIS — IMO0002 Reserved for concepts with insufficient information to code with codable children: Secondary | ICD-10-CM

## 2015-04-13 DIAGNOSIS — E785 Hyperlipidemia, unspecified: Secondary | ICD-10-CM

## 2015-04-13 DIAGNOSIS — I10 Essential (primary) hypertension: Secondary | ICD-10-CM | POA: Diagnosis not present

## 2015-04-13 NOTE — Assessment & Plan Note (Signed)
We have encouraged continued exercise, careful diet management in an effort to lose weight. 

## 2015-04-13 NOTE — Patient Instructions (Signed)
You are doing well. No medication changes were made.  Please call us if you have new issues that need to be addressed before your next appt.  Your physician wants you to follow-up in: 6 months.  You will receive a reminder letter in the mail two months in advance. If you don't receive a letter, please call our office to schedule the follow-up appointment.   

## 2015-04-13 NOTE — Assessment & Plan Note (Signed)
Blood pressure is well controlled on today's visit. No changes made to the medications. 

## 2015-04-13 NOTE — Assessment & Plan Note (Signed)
Currently with no symptoms of angina. No further workup at this time. Continue current medication regimen. 

## 2015-04-13 NOTE — Progress Notes (Signed)
Patient ID: [name], male    DOB: 05-May-1946, 69 y.o.   MRN: [id number]  HPI Comments: [name] is a very pleasant 69 yo gentleman with remote history of coronary artery disease, bypass surgery in 2006 x5 vessel after he developed anginal symptoms with pain radiating to his jaw with exertion, hyperlipidemia, diabetes, hypertension, who presents for routine followup of his coronary artery disease  In follow-up today, he reports having a high exercise tolerance, recently working with a tractor, Higher education careers adviser, in the heat He had no symptoms of shortness of breath or chest discomfort No regular exercise program, sedentary at baseline Weight has been stable Hematoma and A1c in the 7 range Total cholesterol 130 Denies any symptoms concerning for angina  EKG on today's visit shows normal sinus rhythm with rate 66 bpm, no significant ST or T-wave changes  Other past medical history   labs dated 05/18/2014 showing total cholesterol 126, LDL 62, hemoglobin A1c 7.0 He reports that he is alternating Lipitor 80 mg with 40 mg He does report that he drinks significant alcohol 3-4 drinks every other night.   He has not had a cardiac catheterization or stent since his bypass surgery and reports that since the surgery   Allergies  Allergen Reactions  . Sulfonamide Derivatives     REACTION: unknown reaction    Current Outpatient Prescriptions on File Prior to Visit  Medication Sig Dispense Refill  . amLODipine (NORVASC) 10 MG tablet take 1 tablet by mouth once daily 90 tablet 0  . aspirin 325 MG tablet Take 325 mg by mouth daily.      Marland Kitchen atorvastatin (LIPITOR) 80 MG tablet take 1 tablet by mouth once daily 30 tablet 11  . fish oil-omega-3 fatty acids 1000 MG capsule Take 2 g by mouth daily.      Marland Kitchen losartan (COZAAR) 50 MG tablet take 1 tablet by mouth once daily 90 tablet 0  . metFORMIN (GLUCOPHAGE-XR) 500 MG 24 hr tablet take 2 tablets by mouth once daily with BREAKFAST 180 tablet 0  . metoprolol  (LOPRESSOR) 50 MG tablet take 1 tablet by mouth twice a day 180 tablet 0  . Multiple Vitamin (MULTIVITAMIN) tablet Take 1 tablet by mouth daily.       No current facility-administered medications on file prior to visit.    Past Medical History  Diagnosis Date  . HTN (hypertension) 10/03  . HLD (hyperlipidemia) 10/03  . DMII (diabetes mellitus, type 2) 10/03  . CAD (coronary artery disease) 9/06    CABG x4  . Colon polyps   . Alcohol abuse, unspecified   . Depression     Past Surgical History  Procedure Laterality Date  . Coronary artery bypass graft  9/06    x4  . Tonsillectomy    . Esophagogastroduodenoscopy  7/07    polyp; mucosal abnml  . Colonoscopy      Social History  reports that he has quit smoking. He has never used smokeless tobacco. He reports that he drinks about 9.0 oz of alcohol per week. He reports that he does not use illicit drugs.  Family History family history includes Alcohol abuse in his brother and father; Anxiety disorder in his brother; COPD in his mother; Cirrhosis in his brother; Heart disease in his father and paternal grandmother; Other in his father; Stroke in his father. There is no history of Colon cancer, Esophageal cancer, Rectal cancer, or Stomach cancer.   Review of Systems  Constitutional: Negative.   Respiratory:  Negative.   Cardiovascular: Negative.   Gastrointestinal: Negative.        Heartburn  Musculoskeletal: Negative.   Neurological: Negative.   Hematological: Negative.   Psychiatric/Behavioral: Negative.   All other systems reviewed and are negative.   BP 128/68 mmHg  Pulse 66  Ht 5\' 8"  (1.727 m)  Wt 185 lb 12 oz (84.256 kg)  BMI 28.25 kg/m2  Physical Exam  Constitutional: He is oriented to person, place, and time. He appears well-developed and well-nourished.  HENT:  Head: Normocephalic.  Nose: Nose normal.  Mouth/Throat: Oropharynx is clear and moist.  Eyes: Conjunctivae are normal. Pupils are equal, round, and  reactive to light.  Neck: Normal range of motion. Neck supple. No JVD present.  Cardiovascular: Normal rate, regular rhythm, S1 normal, S2 normal, normal heart sounds and intact distal pulses.  Exam reveals no gallop and no friction rub.   No murmur heard. Pulmonary/Chest: Effort normal and breath sounds normal. No respiratory distress. He has no wheezes. He has no rales. He exhibits no tenderness.  Abdominal: Soft. Bowel sounds are normal. He exhibits no distension. There is no tenderness.  Musculoskeletal: Normal range of motion. He exhibits no edema or tenderness.  Lymphadenopathy:    He has no cervical adenopathy.  Neurological: He is alert and oriented to person, place, and time. Coordination normal.  Skin: Skin is warm and dry. No rash noted. No erythema.  Psychiatric: He has a normal mood and affect. His behavior is normal. Judgment and thought content normal.      Assessment and Plan   Nursing note and vitals reviewed.

## 2015-05-23 ENCOUNTER — Other Ambulatory Visit: Payer: Self-pay | Admitting: Family Medicine

## 2015-05-23 DIAGNOSIS — E785 Hyperlipidemia, unspecified: Secondary | ICD-10-CM

## 2015-05-23 DIAGNOSIS — Z125 Encounter for screening for malignant neoplasm of prostate: Secondary | ICD-10-CM

## 2015-05-23 DIAGNOSIS — Z79899 Other long term (current) drug therapy: Secondary | ICD-10-CM

## 2015-05-23 DIAGNOSIS — E119 Type 2 diabetes mellitus without complications: Secondary | ICD-10-CM

## 2015-05-26 ENCOUNTER — Other Ambulatory Visit (INDEPENDENT_AMBULATORY_CARE_PROVIDER_SITE_OTHER): Payer: Commercial Managed Care - HMO

## 2015-05-26 DIAGNOSIS — E785 Hyperlipidemia, unspecified: Secondary | ICD-10-CM | POA: Diagnosis not present

## 2015-05-26 DIAGNOSIS — Z79899 Other long term (current) drug therapy: Secondary | ICD-10-CM

## 2015-05-26 DIAGNOSIS — E119 Type 2 diabetes mellitus without complications: Secondary | ICD-10-CM | POA: Diagnosis not present

## 2015-05-26 DIAGNOSIS — Z125 Encounter for screening for malignant neoplasm of prostate: Secondary | ICD-10-CM

## 2015-05-26 LAB — BASIC METABOLIC PANEL
BUN: 15 mg/dL (ref 6–23)
CALCIUM: 9.8 mg/dL (ref 8.4–10.5)
CO2: 30 meq/L (ref 19–32)
Chloride: 101 mEq/L (ref 96–112)
Creatinine, Ser: 1.1 mg/dL (ref 0.40–1.50)
GFR: 70.54 mL/min (ref >60.00)
Glucose, Bld: 161 mg/dL — ABNORMAL HIGH (ref 70–99)
POTASSIUM: 4.7 meq/L (ref 3.5–5.1)
SODIUM: 139 meq/L (ref 135–145)

## 2015-05-26 LAB — CBC WITH DIFFERENTIAL/PLATELET
BASOS ABS: 0 10*3/uL (ref 0.0–0.1)
Basophils Relative: 0.7 % (ref 0.0–3.0)
Eosinophils Absolute: 0.3 10*3/uL (ref 0.0–0.7)
Eosinophils Relative: 6.2 % — ABNORMAL HIGH (ref 0.0–5.0)
HEMATOCRIT: 47 % (ref 39.0–52.0)
Hemoglobin: 16 g/dL (ref 13.0–17.0)
LYMPHS PCT: 31.4 % (ref 12.0–46.0)
Lymphs Abs: 1.8 10*3/uL (ref 0.7–4.0)
MCHC: 34.1 g/dL (ref 30.0–36.0)
MCV: 92.4 fl (ref 78.0–100.0)
MONOS PCT: 12.3 % — AB (ref 3.0–12.0)
Monocytes Absolute: 0.7 10*3/uL (ref 0.1–1.0)
Neutro Abs: 2.8 10*3/uL (ref 1.4–7.7)
Neutrophils Relative %: 49.4 % (ref 43.0–77.0)
Platelets: 184 10*3/uL (ref 150.0–400.0)
RBC: 5.08 Mil/uL (ref 4.22–5.81)
RDW: 12.6 % (ref 11.5–15.5)
WBC: 5.7 10*3/uL (ref 4.0–10.5)

## 2015-05-26 LAB — MICROALBUMIN / CREATININE URINE RATIO
Creatinine,U: 55.9 mg/dL
MICROALB/CREAT RATIO: 1.3 mg/g (ref 0.0–30.0)

## 2015-05-26 LAB — HEPATIC FUNCTION PANEL
ALBUMIN: 4.5 g/dL (ref 3.5–5.2)
ALT: 33 U/L (ref 0–53)
AST: 21 U/L (ref 0–37)
Alkaline Phosphatase: 54 U/L (ref 39–117)
Bilirubin, Direct: 0.2 mg/dL (ref 0.0–0.3)
Total Bilirubin: 1 mg/dL (ref 0.2–1.2)
Total Protein: 7.1 g/dL (ref 6.0–8.3)

## 2015-05-26 LAB — HEMOGLOBIN A1C: Hgb A1c MFr Bld: 7.2 % — ABNORMAL HIGH (ref 4.6–6.5)

## 2015-05-26 LAB — LIPID PANEL
CHOL/HDL RATIO: 4
CHOLESTEROL: 139 mg/dL (ref 0–200)
HDL: 31.3 mg/dL — ABNORMAL LOW (ref >39.00)
NonHDL: 107.66
TRIGLYCERIDES: 378 mg/dL — AB (ref 0.0–149.0)
VLDL: 75.6 mg/dL — AB (ref 0.0–40.0)

## 2015-05-26 LAB — PSA, MEDICARE: PSA: 0.86 ng/ml (ref 0.10–4.00)

## 2015-05-26 LAB — LDL CHOLESTEROL, DIRECT: Direct LDL: 59 mg/dL

## 2015-05-30 ENCOUNTER — Encounter: Payer: Self-pay | Admitting: Family Medicine

## 2015-05-30 ENCOUNTER — Ambulatory Visit (INDEPENDENT_AMBULATORY_CARE_PROVIDER_SITE_OTHER): Payer: Commercial Managed Care - HMO | Admitting: Family Medicine

## 2015-05-30 VITALS — BP 146/70 | HR 55 | Temp 98.8°F | Ht 66.5 in | Wt 186.0 lb

## 2015-05-30 DIAGNOSIS — E785 Hyperlipidemia, unspecified: Secondary | ICD-10-CM

## 2015-05-30 DIAGNOSIS — F1010 Alcohol abuse, uncomplicated: Secondary | ICD-10-CM

## 2015-05-30 DIAGNOSIS — I10 Essential (primary) hypertension: Secondary | ICD-10-CM

## 2015-05-30 DIAGNOSIS — I25810 Atherosclerosis of coronary artery bypass graft(s) without angina pectoris: Secondary | ICD-10-CM

## 2015-05-30 DIAGNOSIS — Z23 Encounter for immunization: Secondary | ICD-10-CM

## 2015-05-30 DIAGNOSIS — Z0000 Encounter for general adult medical examination without abnormal findings: Secondary | ICD-10-CM

## 2015-05-30 DIAGNOSIS — E1100 Type 2 diabetes mellitus with hyperosmolarity without nonketotic hyperglycemic-hyperosmolar coma (NKHHC): Secondary | ICD-10-CM

## 2015-05-30 NOTE — Progress Notes (Signed)
Pre visit review using our clinic review tool, if applicable. No additional management support is needed unless otherwise documented below in the visit note. 

## 2015-05-30 NOTE — Progress Notes (Signed)
Dr. Frederico Hamman T. Copland, MD, [facility] [place], [address] Phone: [phone] Fax: [phone]  05/30/2015  Patient: [name], MRN: [id number], DOB: 01-12-1946, 69 y.o.  Primary Physician:  [name], MD  Chief Complaint: Annual Exam  Subjective:   Jovaun Levene Sondgeroth is a 69 y.o. pleasant patient who presents with the following:  Preventative Health Maintenance Visit and f/u chronic conditions.   Health Maintenance Summary Reviewed and updated, unless pt declines services.  Tobacco History Reviewed. Alcohol: 1-4 + liquor drinks each night Exercise Habits: Some activity, rec at least 30 mins 5 times a week STD concerns: no risk or activity to increase risk Drug Use: None Encouraged self-testicular check  Health Maintenance  Topic Date Due  . Hepatitis C Screening  03-24-1946  . OPHTHALMOLOGY EXAM  05/20/1956  . ZOSTAVAX  05/20/2006  . HEMOGLOBIN A1C  11/24/2015  . INFLUENZA VACCINE  03/20/2016  . URINE MICROALBUMIN  05/25/2016  . FOOT EXAM  05/30/2016  . COLONOSCOPY  07/19/2017  . TETANUS/TDAP  04/13/2019  . PNA vac Low Risk Adult  Completed   Immunization History  Administered Date(s) Administered  . Influenza Split 08/02/2011, 06/11/2012  . Influenza Whole 08/10/2010  . Influenza, High Dose Seasonal PF 05/18/2014  . Influenza,inj,Quad PF,36+ Mos 05/25/2013, 05/30/2015  . Pneumococcal Conjugate-13 05/30/2015  . Pneumococcal Polysaccharide-23 10/11/2011  . Td 08/20/1993, 04/12/2009   Diabetes Mellitus: Tolerating Medications: yes Compliance with diet: fair Exercise: minimal / intermittent Avg blood sugars at home: not checking Foot problems: none Hypoglycemia: none No nausea, vomitting, blurred vision, polyuria.  Lab Results  Component Value Date   HGBA1C 7.2* 05/26/2015   HGBA1C 7.0* 05/18/2014   HGBA1C 6.5 05/25/2013   Lab Results  Component Value Date   MICROALBUR <0.7  05/26/2015   LDLCALC 62 05/18/2014   CREATININE 1.10 05/26/2015    Wt Readings from Last 3 Encounters:  05/30/15 186 lb (84.369 kg)  04/13/15 185 lb 12 oz (84.256 kg)  07/22/14 185 lb 12 oz (84.256 kg)    Body mass index is 29.57 kg/(m^2).   HTN: Tolerating all medications without side effects Stable and at goal No CP, no sob. No HA.  BP Readings from Last 3 Encounters:  05/30/15 146/70  04/13/15 128/68  07/22/14 812/75    Basic Metabolic Panel:    Component Value Date/Time   NA 139 05/26/2015 0811   K 4.7 05/26/2015 0811   CL 101 05/26/2015 0811   CO2 30 05/26/2015 0811   BUN 15 05/26/2015 0811   CREATININE 1.10 05/26/2015 0811   GLUCOSE 161* 05/26/2015 0811   CALCIUM 9.8 05/26/2015 0811   Lipids: Doing well, stable. Tolerating meds fine with no SE. Panel reviewed with patient.  Lipids:    Component Value Date/Time   CHOL 139 05/26/2015 0811   TRIG 378.0* 05/26/2015 0811   HDL 31.30* 05/26/2015 0811   LDLDIRECT 59.0 05/26/2015 0811   VLDL 75.6* 05/26/2015 0811   CHOLHDL 4 05/26/2015 0811    Lab Results  Component Value Date   ALT 33 05/26/2015   AST 21 05/26/2015   ALKPHOS 54 05/26/2015   BILITOT 1.0 05/26/2015    Ongoing alcohol use.   Patient Active Problem List   Diagnosis Date Noted  . CAD (coronary artery disease), autologous vein bypass graft 10/11/2011    Priority: High  . Diabetes type 2, uncontrolled ([place]) 04/13/2009    Priority: High  . HTN (hypertension)  Priority: Medium  . Hyperlipidemia LDL goal <70 05/13/2008    Priority: Medium  . Low back strain 02/01/2014  . Alcohol abuse 08/10/2010  . OTHER MALAISE AND FATIGUE 04/13/2009  . History of colonic polyps 03/12/2007  . DEPRESSION 12/03/2006   Past Medical History  Diagnosis Date  . HTN (hypertension) 10/03  . HLD (hyperlipidemia) 10/03  . DMII (diabetes mellitus, type 2) ([place]) 10/03  . CAD (coronary artery disease) 9/06    CABG x4  . Colon polyps   . Alcohol abuse,  unspecified   . Depression    Past Surgical History  Procedure Laterality Date  . Coronary artery bypass graft  9/06    x4  . Tonsillectomy    . Esophagogastroduodenoscopy  7/07    polyp; mucosal abnml  . Colonoscopy     Social History   Social History  . Marital Status: Married    Spouse Name: N/A  . Number of Children: N/A  . Years of Education: N/A   Occupational History  . Not on file.   Social History Main Topics  . Smoking status: Former Research scientist (life sciences)  . Smokeless tobacco: Never Used     Comment: quit 36 years ago  . Alcohol Use: 9.0 oz/week    15 Shots of liquor per week     Comment: Regular  . Drug Use: No  . Sexual Activity: Not on file   Other Topics Concern  . Not on file   Social History Narrative   Cares for his wife who is disabled with dementia         Family History  Problem Relation Age of Onset  . Alcohol abuse Father   . Stroke Father   . Other Father     cardiac problems  . Heart disease Father   . COPD Mother   . Alcohol abuse Brother   . Cirrhosis Brother     EtOH  . Anxiety disorder Brother   . Heart disease Paternal Grandmother   . Colon cancer Neg Hx   . Esophageal cancer Neg Hx   . Rectal cancer Neg Hx   . Stomach cancer Neg Hx    Allergies  Allergen Reactions  . Sulfonamide Derivatives     REACTION: unknown reaction    Medication list has been reviewed and updated.   General: Denies fever, chills, sweats. No significant weight loss. Eyes: Denies blurring,significant itching ENT: Denies earache, sore throat, and hoarseness. Cardiovascular: Denies chest pains, palpitations, dyspnea on exertion Respiratory: Denies cough, dyspnea at rest,wheeezing Breast: no concerns about lumps GI: Denies nausea, vomiting, diarrhea, constipation, change in bowel habits, abdominal pain, melena, hematochezia GU: Denies penile discharge, ED, urinary flow / outflow problems. No STD concerns. Musculoskeletal: Denies back pain, joint pain Derm:  Denies rash, itching Neuro: Denies  paresthesias, frequent falls, frequent headaches Psych: Denies depression, anxiety Endocrine: Denies cold intolerance, heat intolerance, polydipsia Heme: Denies enlarged lymph nodes Allergy: No hayfever  Objective:   BP 146/70 mmHg  Pulse 55  Temp(Src) 98.8 F (37.1 C) (Oral)  Ht 5' 6.5" (1.689 m)  Wt 186 lb (84.369 kg)  BMI 29.57 kg/m2 Ideal Body Weight: Weight in (lb) to have BMI = 25: 156.9   Hearing Screening   Method: Audiometry   125Hz 250Hz 500Hz 1000Hz 2000Hz 4000Hz 8000Hz  Right ear:   _0 Left ear:   _1 40     Visual Acuity Screening   Right eye Left eye Both eyes  Without correction: 20/20 20/25 20/20  With correction:       GEN: well developed, well nourished, no acute distress Eyes: conjunctiva and lids normal, PERRLA, EOMI ENT: TM clear, nares clear, oral exam WNL Neck: supple, no lymphadenopathy, no thyromegaly, no JVD Pulm: clear to auscultation and percussion, respiratory effort normal CV: regular rate and rhythm, S1-S2, no murmur, rub or gallop, no bruits, peripheral pulses normal and symmetric, no cyanosis, clubbing, edema or varicosities GI: soft, non-tender; no hepatosplenomegaly, masses; active bowel sounds all quadrants GU: no hernia, testicular mass, penile discharge Lymph: no cervical, axillary or inguinal adenopathy MSK: gait normal, muscle tone and strength WNL, no joint swelling, effusions, discoloration, crepitus  SKIN: clear, good turgor, color WNL, no rashes, lesions, or ulcerations Neuro: normal mental status, normal strength, sensation, and motion Psych: alert; oriented to person, place and time, normally interactive and not anxious or depressed in appearance. All labs reviewed with patient.  Lipids:    Component Value Date/Time   CHOL 139 05/26/2015 0811   TRIG 378.0* 05/26/2015 0811   HDL 31.30* 05/26/2015 0811   LDLDIRECT 59.0 05/26/2015 0811   VLDL 75.6* 05/26/2015 0811    CHOLHDL 4 05/26/2015 0811   CBC: CBC Latest Ref Rng 05/26/2015 05/18/2014 12/03/2012  WBC 4.0 - 10.5 K/uL 5.7 5.5 5.6  Hemoglobin 13.0 - 17.0 g/dL 16.0 16.0 15.7  Hematocrit 39.0 - 52.0 % 47.0 46.0 45.8  Platelets 150.0 - 400.0 K/uL 184.0 167.0 599.3    Basic Metabolic Panel:    Component Value Date/Time   NA 139 05/26/2015 0811   K 4.7 05/26/2015 0811   CL 101 05/26/2015 0811   CO2 30 05/26/2015 0811   BUN 15 05/26/2015 0811   CREATININE 1.10 05/26/2015 0811   GLUCOSE 161* 05/26/2015 0811   CALCIUM 9.8 05/26/2015 0811   Hepatic Function Latest Ref Rng 05/26/2015 05/18/2014 12/03/2012  Total Protein 6.0 - 8.3 g/dL 7.1 7.1 6.8  Albumin 3.5 - 5.2 g/dL 4.5 4.5 4.4  AST 0 - 37 U/L _0 ALT 0 - 53 U/L 33 44 44  Alk Phosphatase 39 - 117 U/L 54 43 43  Total Bilirubin 0.2 - 1.2 mg/dL 1.0 1.2 1.1  Bilirubin, Direct 0.0 - 0.3 mg/dL 0.2 0.2 0.1    Lab Results  Component Value Date   TSH 1.67 08/10/2010   Lab Results  Component Value Date   PSA 0.86 05/26/2015   PSA 0.79 05/18/2014   PSA 1.02 12/03/2012    Assessment and Plan:   Health care maintenance  Coronary artery disease involving autologous vein coronary bypass graft without angina pectoris - Plan: Ambulatory referral to Cardiology  Uncontrolled type 2 diabetes mellitus with hyperosmolarity without coma, without long-term current use of insulin (HCC)  Essential hypertension  Hyperlipidemia LDL goal <70  Alcohol abuse  Need for prophylactic vaccination and inoculation against influenza - Plan: Flu Vaccine QUAD 36+ mos IM  Need for prophylactic vaccination against Streptococcus pneumoniae (pneumococcus) - Plan: Pneumococcal conjugate vaccine 13-valent IM  The patient and I both think that probably the worsening of his diabetes control stems from his continued use of alcohol.  He drinks anywhere from 1-4 drinks of alcohol nightly of liquor, and sometimes more than this.  He recognizes that this may be an issue.  He  has  Not ready to quit, but he does think that he can cut it back to  One drink a night.  At this point, I would not alter any of his  medications and would not alter his diabetic medications.  Health Maintenance Exam: The patient's preventative maintenance and recommended screening tests for an annual wellness exam were reviewed in full today. Brought up to date unless services declined.  Counselled on the importance of diet, exercise, and its role in overall health and mortality. The patient's FH and SH was reviewed, including their home life, tobacco status, and drug and alcohol status.  Follow-up: No Follow-up on file. Unless noted, follow-up in 1 year for Health Maintenance Exam.  New Prescriptions   No medications on file   Orders Placed This Encounter  Procedures  . Flu Vaccine QUAD 36+ mos IM  . Pneumococcal conjugate vaccine 13-valent IM  . Ambulatory referral to Cardiology    Signed,  Frederico Hamman T. Copland, MD   Patient's Medications  New Prescriptions   No medications on file  Previous Medications   AMLODIPINE (NORVASC) 10 MG TABLET    take 1 tablet by mouth once daily   ASPIRIN 325 MG TABLET    Take 325 mg by mouth daily.     ATORVASTATIN (LIPITOR) 80 MG TABLET    take 1 tablet by mouth once daily   FISH OIL-OMEGA-3 FATTY ACIDS 1000 MG CAPSULE    Take 2 g by mouth daily.     LOSARTAN (COZAAR) 50 MG TABLET    take 1 tablet by mouth once daily   METFORMIN (GLUCOPHAGE-XR) 500 MG 24 HR TABLET    take 2 tablets by mouth once daily with BREAKFAST   METOPROLOL (LOPRESSOR) 50 MG TABLET    take 1 tablet by mouth twice a day   MULTIPLE VITAMIN (MULTIVITAMIN) TABLET    Take 1 tablet by mouth daily.     OMEPRAZOLE (PRILOSEC) 20 MG CAPSULE    Take 20 mg by mouth daily.  Modified Medications   No medications on file  Discontinued Medications   No medications on file

## 2015-09-01 ENCOUNTER — Other Ambulatory Visit: Payer: Self-pay | Admitting: Family Medicine

## 2015-09-04 ENCOUNTER — Other Ambulatory Visit: Payer: Self-pay | Admitting: Family Medicine

## 2015-11-21 ENCOUNTER — Ambulatory Visit (INDEPENDENT_AMBULATORY_CARE_PROVIDER_SITE_OTHER): Payer: Commercial Managed Care - HMO | Admitting: Cardiovascular Disease

## 2015-11-21 ENCOUNTER — Encounter: Payer: Self-pay | Admitting: Cardiovascular Disease

## 2015-11-21 VITALS — BP 138/72 | HR 56 | Ht 68.0 in | Wt 184.8 lb

## 2015-11-21 DIAGNOSIS — E785 Hyperlipidemia, unspecified: Secondary | ICD-10-CM

## 2015-11-21 DIAGNOSIS — I25810 Atherosclerosis of coronary artery bypass graft(s) without angina pectoris: Secondary | ICD-10-CM

## 2015-11-21 DIAGNOSIS — E1100 Type 2 diabetes mellitus with hyperosmolarity without nonketotic hyperglycemic-hyperosmolar coma (NKHHC): Secondary | ICD-10-CM | POA: Diagnosis not present

## 2015-11-21 DIAGNOSIS — I10 Essential (primary) hypertension: Secondary | ICD-10-CM

## 2015-11-21 NOTE — Assessment & Plan Note (Signed)
Blood pressure recently well controlled, For fatigue, recommended he decrease metoprolol down to 25 mg twice a day

## 2015-11-21 NOTE — Assessment & Plan Note (Signed)
We have encouraged continued exercise, careful diet management in an effort to lose weight. 

## 2015-11-21 NOTE — Assessment & Plan Note (Signed)
Currently with no symptoms of angina. No further workup at this time. Continue current medication regimen. 

## 2015-11-21 NOTE — Assessment & Plan Note (Signed)
Cholesterol is at goal on the current lipid regimen. No changes to the medications were made.  

## 2015-11-21 NOTE — Progress Notes (Signed)
Patient ID: [name], male    DOB: 1945-09-17, 70 y.o.   MRN: [id number]  HPI Comments: [name] is a very pleasant 70 yo gentleman with remote history of coronary artery disease, bypass surgery in 2006 x5 vessel after he developed anginal symptoms with pain radiating to his jaw with exertion, hyperlipidemia, diabetes, hypertension, who presents for routine followup of his coronary artery disease  In follow-up, he reports that he is doing well Active at baseline, some fatigue Denies any symptoms concerning for angina. No shortness of breath He is taking Lipitor 80 alternating with 40 mg daily  EKG reviewed showing sinus bradycardia, rate 56 bpm, no significant ST or T-wave changes  Other past medical history  labs dated 05/18/2014 showing total cholesterol 126, LDL 62, hemoglobin A1c 7.0 He reports that he is alternating Lipitor 80 mg with 40 mg He does report that he drinks significant alcohol 3-4 drinks every other night.   He has not had a cardiac catheterization or stent since his bypass surgery and reports that since the surgery   Allergies  Allergen Reactions  . Sulfonamide Derivatives     REACTION: unknown reaction    Current Outpatient Prescriptions on File Prior to Visit  Medication Sig Dispense Refill  . amLODipine (NORVASC) 10 MG tablet take 1 tablet by mouth once daily 90 tablet 1  . aspirin 325 MG tablet Take 325 mg by mouth daily.      Marland Kitchen atorvastatin (LIPITOR) 80 MG tablet take 1 tablet by mouth once daily 90 tablet 2  . fish oil-omega-3 fatty acids 1000 MG capsule Take 2 g by mouth daily.      Marland Kitchen losartan (COZAAR) 50 MG tablet take 1 tablet by mouth once daily 90 tablet 1  . metFORMIN (GLUCOPHAGE-XR) 500 MG 24 hr tablet take 2 tablets by mouth once daily WITH BREAKFAST 180 tablet 1  . metoprolol (LOPRESSOR) 50 MG tablet take 1 tablet by mouth twice a day 180 tablet 1  . Multiple Vitamin (MULTIVITAMIN) tablet Take 1 tablet by mouth daily.      Marland Kitchen omeprazole  (PRILOSEC) 20 MG capsule Take 20 mg by mouth daily.     No current facility-administered medications on file prior to visit.    Past Medical History  Diagnosis Date  . HTN (hypertension) 10/03  . HLD (hyperlipidemia) 10/03  . DMII (diabetes mellitus, type 2) ([place]) 10/03  . CAD (coronary artery disease) 9/06    CABG x4  . Colon polyps   . Alcohol abuse, unspecified   . Depression     Past Surgical History  Procedure Laterality Date  . Coronary artery bypass graft  9/06    x4  . Tonsillectomy    . Esophagogastroduodenoscopy  7/07    polyp; mucosal abnml  . Colonoscopy      Social History  reports that he has quit smoking. He has never used smokeless tobacco. He reports that he drinks about 9.0 oz of alcohol per week. He reports that he does not use illicit drugs.  Family History family history includes Alcohol abuse in his brother and father; Anxiety disorder in his brother; COPD in his mother; Cirrhosis in his brother; Heart disease in his father and paternal grandmother; Other in his father; Stroke in his father. There is no history of Colon cancer, Esophageal cancer, Rectal cancer, or Stomach cancer.   Review of Systems  Constitutional: Negative.   Respiratory: Negative.   Cardiovascular: Negative.   Gastrointestinal: Negative.  Heartburn  Musculoskeletal: Negative.   Neurological: Negative.   Hematological: Negative.   Psychiatric/Behavioral: Negative.   All other systems reviewed and are negative.   BP 138/72 mmHg  Pulse 56  Ht 5\' 8"  (1.727 m)  Wt 184 lb 12 oz (83.802 kg)  BMI 28.10 kg/m2  Physical Exam  Constitutional: He is oriented to person, place, and time. He appears well-developed and well-nourished.  HENT:  Head: Normocephalic.  Nose: Nose normal.  Mouth/Throat: Oropharynx is clear and moist.  Eyes: Conjunctivae are normal. Pupils are equal, round, and reactive to light.  Neck: Normal range of motion. Neck supple. No JVD present.   Cardiovascular: Normal rate, regular rhythm, S1 normal, S2 normal, normal heart sounds and intact distal pulses.  Exam reveals no gallop and no friction rub.   No murmur heard. Pulmonary/Chest: Effort normal and breath sounds normal. No respiratory distress. He has no wheezes. He has no rales. He exhibits no tenderness.  Abdominal: Soft. Bowel sounds are normal. He exhibits no distension. There is no tenderness.  Musculoskeletal: Normal range of motion. He exhibits no edema or tenderness.  Lymphadenopathy:    He has no cervical adenopathy.  Neurological: He is alert and oriented to person, place, and time. Coordination normal.  Skin: Skin is warm and dry. No rash noted. No erythema.  Psychiatric: He has a normal mood and affect. His behavior is normal. Judgment and thought content normal.      Assessment and Plan   Nursing note and vitals reviewed.

## 2015-11-21 NOTE — Patient Instructions (Signed)
You are doing well. Please cut the metoprolol in 1/2 twice a day  Please call us if you have new issues that need to be addressed before your next appt.  Your physician wants you to follow-up in: 6 months.  You will receive a reminder letter in the mail two months in advance. If you don't receive a letter, please call our office to schedule the follow-up appointment.   

## 2015-11-30 ENCOUNTER — Other Ambulatory Visit: Payer: Self-pay | Admitting: *Deleted

## 2015-11-30 MED ORDER — METFORMIN HCL ER 500 MG PO TB24: ORAL_TABLET | ORAL | Status: DC

## 2015-11-30 MED ORDER — LOSARTAN POTASSIUM 50 MG PO TABS: 50.0000 mg | ORAL_TABLET | Freq: Every day | ORAL | Status: DC

## 2015-11-30 NOTE — Addendum Note (Signed)
Addended by: Carter Kitten on: 11/30/2015 04:56 PM   Modules accepted: Orders

## 2016-03-05 ENCOUNTER — Other Ambulatory Visit: Payer: Self-pay | Admitting: *Deleted

## 2016-03-05 MED ORDER — AMLODIPINE BESYLATE 10 MG PO TABS: 10.0000 mg | ORAL_TABLET | Freq: Every day | ORAL | Status: DC

## 2016-03-05 MED ORDER — METOPROLOL TARTRATE 50 MG PO TABS: 50.0000 mg | ORAL_TABLET | Freq: Two times a day (BID) | ORAL | Status: DC

## 2016-03-05 NOTE — Addendum Note (Signed)
Addended by: Carter Kitten on: 03/05/2016 11:18 AM   Modules accepted: Orders

## 2016-03-14 ENCOUNTER — Other Ambulatory Visit: Payer: Self-pay | Admitting: Family Medicine

## 2016-05-15 ENCOUNTER — Encounter: Payer: Self-pay | Admitting: Cardiovascular Disease

## 2016-05-15 ENCOUNTER — Ambulatory Visit (INDEPENDENT_AMBULATORY_CARE_PROVIDER_SITE_OTHER): Payer: Commercial Managed Care - HMO | Admitting: Cardiovascular Disease

## 2016-05-15 VITALS — BP 140/80 | HR 60 | Ht 68.0 in | Wt 179.5 lb

## 2016-05-15 DIAGNOSIS — E1159 Type 2 diabetes mellitus with other circulatory complications: Secondary | ICD-10-CM

## 2016-05-15 DIAGNOSIS — I10 Essential (primary) hypertension: Secondary | ICD-10-CM | POA: Diagnosis not present

## 2016-05-15 DIAGNOSIS — I25810 Atherosclerosis of coronary artery bypass graft(s) without angina pectoris: Secondary | ICD-10-CM

## 2016-05-15 DIAGNOSIS — E785 Hyperlipidemia, unspecified: Secondary | ICD-10-CM

## 2016-05-15 DIAGNOSIS — M2550 Pain in unspecified joint: Secondary | ICD-10-CM

## 2016-05-15 DIAGNOSIS — F1010 Alcohol abuse, uncomplicated: Secondary | ICD-10-CM

## 2016-05-15 DIAGNOSIS — IMO0002 Reserved for concepts with insufficient information to code with codable children: Secondary | ICD-10-CM

## 2016-05-15 DIAGNOSIS — E1165 Type 2 diabetes mellitus with hyperglycemia: Secondary | ICD-10-CM

## 2016-05-15 NOTE — Patient Instructions (Signed)

## 2016-05-15 NOTE — Progress Notes (Signed)
Cardiology Office Note  Date:  05/15/2016   ID:  SHERRON IPOCK, DOB Jan 23, 1946, MRN [id number]  PCP:  [name], MD   Chief Complaint  Patient presents with  . other    6 month follow up. "doing well."     HPI:   [name] is a very pleasant 70 yo gentleman with remote history of coronary artery disease, bypass surgery in 2006 x5 vessel after he developed anginal symptoms with pain radiating to his jaw with exertion, hyperlipidemia, diabetes, hypertension, who presents for routine followup of his coronary artery disease  In follow-up, he reports that he is doing well Denies any symptoms concerning for chest pain or jaw pain No shortness of breath He is taking Lipitor 80 alternating with 40 mg daily No recent lab work available, Reports having some diffuse arthritis pain, in his thumbs, feet No regular exercise program Continues to drink some alcohol daily  7 pound weight loss compared to last year  EKG reviewed showing sinus bradycardia, rate 57 bpm, no significant ST or T-wave changes  Other past medical history  labs dated 05/18/2014 showing total cholesterol 126, LDL 62, hemoglobin A1c 7.0 He reports that he is alternating Lipitor 80 mg with 40 mg   He has not had a cardiac catheterization since his bypass surgery   PMH:   has a past medical history of Alcohol abuse, unspecified; CAD (coronary artery disease) (9/06); Colon polyps; Depression; DMII (diabetes mellitus, type 2) ([place]) (10/03); HLD (hyperlipidemia) (10/03); and HTN (hypertension) (10/03).  PSH:    Past Surgical History:  Procedure Laterality Date  . COLONOSCOPY    . CORONARY ARTERY BYPASS GRAFT  9/06   x4  . ESOPHAGOGASTRODUODENOSCOPY  7/07   polyp; mucosal abnml  . TONSILLECTOMY      Current Outpatient Prescriptions  Medication Sig Dispense Refill  . amLODipine (NORVASC) 10 MG tablet Take 1 tablet (10 mg total) by mouth daily. 90 tablet 0  . aspirin 325 MG tablet Take 325 mg by mouth daily.       Marland Kitchen atorvastatin (LIPITOR) 80 MG tablet take 1 tablet by mouth once daily 90 tablet 2  . fish oil-omega-3 fatty acids 1000 MG capsule Take 2 g by mouth daily.      Marland Kitchen losartan (COZAAR) 50 MG tablet Take 1 tablet (50 mg total) by mouth daily. 90 tablet 1  . metFORMIN (GLUCOPHAGE-XR) 500 MG 24 hr tablet take 2 tablets by mouth once daily WITH BREAKFAST 180 tablet 1  . metoprolol (LOPRESSOR) 50 MG tablet Take 1 tablet (50 mg total) by mouth 2 (two) times daily. 180 tablet 0  . Multiple Vitamin (MULTIVITAMIN) tablet Take 1 tablet by mouth daily.      Marland Kitchen omeprazole (PRILOSEC) 20 MG capsule Take 20 mg by mouth daily.     No current facility-administered medications for this visit.      Allergies:   Sulfonamide derivatives   Social History:  The patient  reports that he has quit smoking. He has never used smokeless tobacco. He reports that he drinks about 9.0 oz of alcohol per week . He reports that he does not use drugs.   Family History:   family history includes Alcohol abuse in his brother and father; Anxiety disorder in his brother; COPD in his mother; Cirrhosis in his brother; Heart disease in his father and paternal grandmother; Other in his father; Stroke in his father.    Review of Systems: Review of Systems  Constitutional: Negative.   Respiratory:  Negative.   Cardiovascular: Negative.   Gastrointestinal: Negative.   Musculoskeletal: Positive for joint pain.  Neurological: Negative.   Psychiatric/Behavioral: Negative.   All other systems reviewed and are negative.    PHYSICAL EXAM: VS:  BP 140/80 (BP Location: Left Arm, Patient Position: Sitting, Cuff Size: Normal)   Pulse 60   Ht 5\' 8"  (1.727 m)   Wt 179 lb 8 oz (81.4 kg)   BMI 27.29 kg/m  , BMI Body mass index is 27.29 kg/m. GEN: Well nourished, well developed, in no acute distress  HEENT: normal  Neck: no JVD, carotid bruits, or masses Cardiac: RRR; no murmurs, rubs, or gallops,no edema  Respiratory:  clear to  auscultation bilaterally, normal work of breathing GI: soft, nontender, nondistended, + BS MS: no deformity or atrophy  Skin: warm and dry, no rash Neuro:  Strength and sensation are intact Psych: euthymic mood, full affect    Recent Labs: 05/26/2015: ALT 33; BUN 15; Creatinine, Ser 1.10; Hemoglobin 16.0; Platelets 184.0; Potassium 4.7; Sodium 139    Lipid Panel Lab Results  Component Value Date   CHOL 139 05/26/2015   HDL 31.30 (L) 05/26/2015   LDLCALC 62 05/18/2014   TRIG 378.0 (H) 05/26/2015      Wt Readings from Last 3 Encounters:  05/15/16 179 lb 8 oz (81.4 kg)  11/21/15 184 lb 12 oz (83.8 kg)  05/30/15 186 lb (84.4 kg)       ASSESSMENT AND PLAN:  Coronary artery disease involving autologous vein coronary bypass graft without angina pectoris - Plan: EKG 12-Lead Currently with no symptoms of angina. No further workup at this time. Continue current medication regimen.  Essential hypertension - Plan: EKG 12-Lead Blood pressure is well controlled on today's visit. No changes made to the medications.  Hyperlipidemia LDL goal <70 No recent lab work available, last year cholesterol well controlled  Uncontrolled type 2 diabetes mellitus with other circulatory complication, without long-term current use of insulin ([place]) Encouraged him to continue his gentle weight loss, down 7 pounds in the past year He will follow-up with primary care for routine lab work  Alcohol abuse Suggested moderating his alcohol intake  Joint pain In the thumbs, feet, suspect arthritis He wonders whether symptoms could be secondary to gout Recommended he follow-up with primary care, may need uric acid level   Total encounter time more than 25 minutes  Greater than 50% was spent in counseling and coordination of care with the patient   Disposition:   F/U  6 months   Orders Placed This Encounter  Procedures  . EKG 12-Lead     Signed, [name], M.D., Ph.D. 05/15/2016  [place], [place]

## 2016-05-28 ENCOUNTER — Other Ambulatory Visit: Payer: Self-pay | Admitting: Family Medicine

## 2016-05-28 DIAGNOSIS — E7849 Other hyperlipidemia: Secondary | ICD-10-CM

## 2016-05-28 DIAGNOSIS — Z125 Encounter for screening for malignant neoplasm of prostate: Secondary | ICD-10-CM

## 2016-05-28 DIAGNOSIS — Z1159 Encounter for screening for other viral diseases: Secondary | ICD-10-CM

## 2016-05-28 DIAGNOSIS — Z79899 Other long term (current) drug therapy: Secondary | ICD-10-CM

## 2016-05-28 DIAGNOSIS — E119 Type 2 diabetes mellitus without complications: Secondary | ICD-10-CM

## 2016-06-04 ENCOUNTER — Other Ambulatory Visit (INDEPENDENT_AMBULATORY_CARE_PROVIDER_SITE_OTHER): Payer: Commercial Managed Care - HMO

## 2016-06-04 DIAGNOSIS — Z1159 Encounter for screening for other viral diseases: Secondary | ICD-10-CM

## 2016-06-04 DIAGNOSIS — Z125 Encounter for screening for malignant neoplasm of prostate: Secondary | ICD-10-CM | POA: Diagnosis not present

## 2016-06-04 DIAGNOSIS — E784 Other hyperlipidemia: Secondary | ICD-10-CM | POA: Diagnosis not present

## 2016-06-04 DIAGNOSIS — E7849 Other hyperlipidemia: Secondary | ICD-10-CM

## 2016-06-04 DIAGNOSIS — Z79899 Other long term (current) drug therapy: Secondary | ICD-10-CM | POA: Diagnosis not present

## 2016-06-04 DIAGNOSIS — M79676 Pain in unspecified toe(s): Secondary | ICD-10-CM | POA: Diagnosis not present

## 2016-06-04 DIAGNOSIS — E119 Type 2 diabetes mellitus without complications: Secondary | ICD-10-CM | POA: Diagnosis not present

## 2016-06-04 DIAGNOSIS — R7989 Other specified abnormal findings of blood chemistry: Secondary | ICD-10-CM | POA: Diagnosis not present

## 2016-06-04 LAB — HEPATIC FUNCTION PANEL
ALK PHOS: 55 U/L (ref 39–117)
ALT: 34 U/L (ref 0–53)
AST: 21 U/L (ref 0–37)
Albumin: 4.7 g/dL (ref 3.5–5.2)
BILIRUBIN DIRECT: 0.2 mg/dL (ref 0.0–0.3)
BILIRUBIN TOTAL: 0.9 mg/dL (ref 0.2–1.2)
Total Protein: 7.3 g/dL (ref 6.0–8.3)

## 2016-06-04 LAB — CBC WITH DIFFERENTIAL/PLATELET
BASOS ABS: 0 10*3/uL (ref 0.0–0.1)
Basophils Relative: 0.6 % (ref 0.0–3.0)
EOS ABS: 0.2 10*3/uL (ref 0.0–0.7)
Eosinophils Relative: 4 % (ref 0.0–5.0)
HCT: 44.4 % (ref 39.0–52.0)
Hemoglobin: 15.5 g/dL (ref 13.0–17.0)
LYMPHS ABS: 1.6 10*3/uL (ref 0.7–4.0)
Lymphocytes Relative: 26.4 % (ref 12.0–46.0)
MCHC: 34.9 g/dL (ref 30.0–36.0)
MCV: 91.3 fl (ref 78.0–100.0)
MONO ABS: 0.6 10*3/uL (ref 0.1–1.0)
MONOS PCT: 10.6 % (ref 3.0–12.0)
NEUTROS ABS: 3.5 10*3/uL (ref 1.4–7.7)
NEUTROS PCT: 58.4 % (ref 43.0–77.0)
PLATELETS: 183 10*3/uL (ref 150.0–400.0)
RBC: 4.86 Mil/uL (ref 4.22–5.81)
RDW: 12.3 % (ref 11.5–15.5)
WBC: 6 10*3/uL (ref 4.0–10.5)

## 2016-06-04 LAB — URIC ACID: URIC ACID, SERUM: 7.2 mg/dL (ref 4.0–7.8)

## 2016-06-04 LAB — HEMOGLOBIN A1C: Hgb A1c MFr Bld: 7.6 % — ABNORMAL HIGH (ref 4.6–6.5)

## 2016-06-04 LAB — BASIC METABOLIC PANEL
BUN: 16 mg/dL (ref 6–23)
CALCIUM: 9.8 mg/dL (ref 8.4–10.5)
CO2: 28 meq/L (ref 19–32)
CREATININE: 1.2 mg/dL (ref 0.40–1.50)
Chloride: 103 mEq/L (ref 96–112)
GFR: 63.61 mL/min (ref >60.00)
GLUCOSE: 157 mg/dL — AB (ref 70–99)
Potassium: 4.9 mEq/L (ref 3.5–5.1)
Sodium: 139 mEq/L (ref 135–145)

## 2016-06-04 LAB — LIPID PANEL
Cholesterol: 118 mg/dL (ref 0–200)
HDL: 31.1 mg/dL — AB (ref >39.00)
NonHDL: 86.51
TRIGLYCERIDES: 366 mg/dL — AB (ref 0.0–149.0)
Total CHOL/HDL Ratio: 4
VLDL: 73.2 mg/dL — AB (ref 0.0–40.0)

## 2016-06-04 LAB — LDL CHOLESTEROL, DIRECT: Direct LDL: 46 mg/dL

## 2016-06-04 LAB — MICROALBUMIN / CREATININE URINE RATIO
Creatinine,U: 105.2 mg/dL
MICROALB/CREAT RATIO: 1 mg/g (ref 0.0–30.0)
Microalb, Ur: 1 mg/dL (ref 0.0–1.9)

## 2016-06-04 LAB — PSA, MEDICARE: PSA: 2.87 ng/mL (ref 0.10–4.00)

## 2016-06-05 LAB — HEPATITIS C ANTIBODY: HCV AB: NEGATIVE

## 2016-06-06 ENCOUNTER — Encounter: Payer: Self-pay | Admitting: Family Medicine

## 2016-06-06 ENCOUNTER — Ambulatory Visit (INDEPENDENT_AMBULATORY_CARE_PROVIDER_SITE_OTHER): Payer: Commercial Managed Care - HMO | Admitting: Family Medicine

## 2016-06-06 VITALS — BP 150/80 | HR 61 | Temp 98.4°F | Ht 66.5 in | Wt 178.2 lb

## 2016-06-06 DIAGNOSIS — Z0000 Encounter for general adult medical examination without abnormal findings: Secondary | ICD-10-CM

## 2016-06-06 MED ORDER — OMEPRAZOLE 20 MG PO CPDR: 20.0000 mg | DELAYED_RELEASE_CAPSULE | Freq: Every day | ORAL | 3 refills | Status: DC

## 2016-06-06 NOTE — Progress Notes (Signed)
Pre visit review using our clinic review tool, if applicable. No additional management support is needed unless otherwise documented below in the visit note. 

## 2016-06-06 NOTE — Progress Notes (Signed)
Dr. Frederico Hamman T. Edel Rivero, MD, [facility] [place], [address] Phone: [phone] Fax: [phone]  06/06/2016  Patient: [name], MRN: [id number], DOB: 09-May-1946, 70 y.o.  Primary Physician:  [name], MD   Chief Complaint  Patient presents with  . Medicare Wellness   Subjective:   [name] is a 70 y.o. pleasant patient who presents for a medicare wellness examination and full physical:  Preventative Health Maintenance Visit:  Health Maintenance Summary Reviewed and updated, unless pt declines services.  Tobacco History Reviewed. 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  Takes omeprazole -   L MTP joint - has been hurting from some joints  B wrist pain. Ibuprofen helps.  CMC joint OA Neck pain - when riding motorcycle.  Back pain  Health Maintenance  Topic Date Due  . OPHTHALMOLOGY EXAM  05/20/1956  . FOOT EXAM  05/30/2016  . ZOSTAVAX  06/06/2017 (Originally 05/20/2006)  . HEMOGLOBIN A1C  12/03/2016  . COLONOSCOPY  07/19/2017  . TETANUS/TDAP  04/13/2019  . INFLUENZA VACCINE  Completed  . Hepatitis C Screening  Completed  . PNA vac Low Risk Adult  Completed    Immunization History  Administered Date(s) Administered  . Influenza Split 08/02/2011, 06/11/2012  . Influenza Whole 08/10/2010  . Influenza, High Dose Seasonal PF 05/18/2014  . Influenza,inj,Quad PF,36+ Mos 05/25/2013, 05/30/2015  . Influenza-Unspecified 05/16/2016  . Pneumococcal Conjugate-13  05/30/2015  . Pneumococcal Polysaccharide-23 10/11/2011  . Td 08/20/1993, 04/12/2009    Patient Active Problem List   Diagnosis Date Noted  . CAD (coronary artery disease), autologous vein bypass graft 10/11/2011    Priority: High  . Diabetes type 2, uncontrolled ([place]) 04/13/2009    Priority: High  . HTN (hypertension)     Priority: Medium  . Hyperlipidemia LDL goal <70 05/13/2008    Priority: Medium  . Joint pain 05/15/2016  . Low back strain 02/01/2014  . Alcohol abuse 08/10/2010  . OTHER MALAISE AND FATIGUE 04/13/2009  . History of colonic polyps 03/12/2007  . DEPRESSION 12/03/2006   Past Medical History:  Diagnosis Date  . Alcohol abuse, unspecified   . CAD (coronary artery disease) 9/06   CABG x4  . Colon polyps   . Depression   . DMII (diabetes mellitus, type 2) ([place]) 10/03  . HLD (hyperlipidemia) 10/03  . HTN (hypertension) 10/03   Past Surgical History:  Procedure Laterality Date  . COLONOSCOPY    . CORONARY ARTERY BYPASS GRAFT  9/06   x4  . ESOPHAGOGASTRODUODENOSCOPY  7/07   polyp; mucosal abnml  . TONSILLECTOMY     Social History   Social History  . Marital status: Married    Spouse name: N/A  . Number of children: N/A  . Years of education: N/A   Occupational History  . Not on file.   Social History Main Topics  . Smoking status: Former Research scientist (life sciences)  . Smokeless tobacco: Never Used     Comment: quit 36 years ago  . Alcohol use 9.0 oz/week  15 Shots of liquor per week     Comment: Regular  . Drug use: No  . Sexual activity: Not on file   Other Topics Concern  . Not on file   Social History Narrative   Cares for his wife who is disabled with dementia         Family History  Problem Relation Age of Onset  . Alcohol abuse Father   . Stroke Father   . Other Father     cardiac problems  . Heart disease Father   . COPD Mother   . Alcohol abuse Brother   . Cirrhosis Brother     EtOH  . Anxiety disorder Brother   . Heart disease  Paternal Grandmother   . Colon cancer Neg Hx   . Esophageal cancer Neg Hx   . Rectal cancer Neg Hx   . Stomach cancer Neg Hx    Allergies  Allergen Reactions  . Sulfonamide Derivatives     REACTION: unknown reaction    Medication list has been reviewed and updated.   General: Denies fever, chills, sweats. No significant weight loss. Eyes: Denies blurring,significant itching ENT: Denies earache, sore throat, and hoarseness. Cardiovascular: Denies chest pains, palpitations, dyspnea on exertion Respiratory: Denies cough, dyspnea at rest,wheeezing Breast: no concerns about lumps GI: Denies nausea, vomiting, diarrhea, constipation, change in bowel habits, abdominal pain, melena, hematochezia GU: Denies penile discharge, ED, urinary flow / outflow problems. No STD concerns. Musculoskeletal: as above Derm: Denies rash, itching Neuro: Denies  paresthesias, frequent falls, frequent headaches Psych: Denies depression, anxiety Endocrine: Denies cold intolerance, heat intolerance, polydipsia Heme: Denies enlarged lymph nodes Allergy: No hayfever  Objective:   BP (!) 150/80   Pulse 61   Temp 98.4 F (36.9 C) (Oral)   Ht 5' 6.5" (1.689 m)   Wt 178 lb 4 oz (80.9 kg)   BMI 28.34 kg/m   The patient completed a fall screen and PHQ-2 and PHQ-9 if necessary, which is documented in the EHR. The CMA/LPN/RN who assisted the patient verbally completed with them and documented results in [place].   Hearing Screening   Method: Audiometry   _0  _1  _2  _3  _4  _5  _6  _7  _8   Right ear:   20 40 20  40    Left ear:   _9 40    Vision Screening Comments: Eye Exam scheduled for 06/19/2016 with Dr. Edison Pace at [facility]: well developed, well nourished, no acute distress Eyes: conjunctiva and lids normal, PERRLA, EOMI ENT: TM clear, nares clear, oral exam WNL Neck: supple, no lymphadenopathy, no thyromegaly, no JVD Pulm: clear to  auscultation and percussion, respiratory effort normal CV: regular rate and rhythm, S1-S2, no murmur, rub or gallop, no bruits, peripheral pulses normal and symmetric, no cyanosis, clubbing, edema or varicosities GI: soft, non-tender; no hepatosplenomegaly, masses; active bowel sounds all quadrants GU: no hernia, testicular mass, penile discharge Lymph: no cervical, axillary or inguinal adenopathy MSK: restricted motion at the MTP joints.  He also has some mild swelling at the ankles.   Restricted motion at the true wrist joint and some pain in the true wrist joint itself.  Pain and crepitus at the [facility] joint.   Some limitation of motion with movement at the neck.  He has some pain from approximately L2-S1 bilaterally in the paraspinous musculature SKIN: clear, good turgor, color WNL, no rashes, lesions, or ulcerations Neuro: normal mental status, normal strength, sensation, and motion Psych:  alert; oriented to person, place and time, normally interactive and not anxious or depressed in appearance.  All labs reviewed with patient.  Lipids:    Component Value Date/Time   CHOL 118 06/04/2016 1120   TRIG 366.0 (H) 06/04/2016 1120   HDL 31.10 (L) 06/04/2016 1120   LDLDIRECT 46.0 06/04/2016 1120   VLDL 73.2 (H) 06/04/2016 1120   CHOLHDL 4 06/04/2016 1120   CBC: CBC Latest Ref Rng & Units 06/04/2016 05/26/2015 05/18/2014  WBC 4.0 - 10.5 K/uL 6.0 5.7 5.5  Hemoglobin 13.0 - 17.0 g/dL 15.5 16.0 16.0  Hematocrit 39.0 - 52.0 % 44.4 47.0 46.0  Platelets 150.0 - 400.0 K/uL 183.0 184.0 628.6    Basic Metabolic Panel:    Component Value Date/Time   NA 139 06/04/2016 1120   K 4.9 06/04/2016 1120   CL 103 06/04/2016 1120   CO2 28 06/04/2016 1120   BUN 16 06/04/2016 1120   CREATININE 1.20 06/04/2016 1120   GLUCOSE 157 (H) 06/04/2016 1120   CALCIUM 9.8 06/04/2016 1120   Hepatic Function Latest Ref Rng & Units 06/04/2016 05/26/2015 05/18/2014  Total Protein 6.0 - 8.3 g/dL 7.3 7.1 7.1  Albumin 3.5  - 5.2 g/dL 4.7 4.5 4.5  AST 0 - 37 U/L _0 ALT 0 - 53 U/L 34 33 44  Alk Phosphatase 39 - 117 U/L 55 54 43  Total Bilirubin 0.2 - 1.2 mg/dL 0.9 1.0 1.2  Bilirubin, Direct 0.0 - 0.3 mg/dL 0.2 0.2 0.2    Lab Results  Component Value Date   TSH 1.67 08/10/2010   Lab Results  Component Value Date   PSA 2.87 06/04/2016   PSA 0.86 05/26/2015   PSA 0.79 05/18/2014   Lab Results  Component Value Date   HGBA1C 7.6 (H) 06/04/2016     Assessment and Plan:   Healthcare maintenance  Arthritis: I tried to reassure him.  No signs of gout, no history of warm joints or swollen joints.  Wear-and-tear arthritis.  Tylenol and NSAIDs as needed.   I counseled him to try to decrease his alcohol intake. For now, I would only do this and try to eat better rather than adjusting any diabetic medication  Health Maintenance Exam: The patient's preventative maintenance and recommended screening tests for an annual wellness exam were reviewed in full today. Brought up to date unless services declined.  Counselled on the importance of diet, exercise, and its role in overall health and mortality. The patient's FH and SH was reviewed, including their home life, tobacco status, and drug and alcohol status.  I have personally reviewed the Medicare Annual Wellness questionnaire and have noted 1. The patient's medical and social history 2. Their use of alcohol, tobacco or illicit drugs 3. Their current medications and supplements 4. The patient's functional ability including ADL's, fall risks, home safety risks and hearing or visual             impairment. 5. Diet and physical activities 6. Evidence for depression or mood disorders 7. Reviewed Updated provider list, see scanned forms and [facility] Snapshot.   The patients weight, height, BMI and visual acuity have been recorded in the chart I have made referrals, counseling and provided education to the patient based review of the above and I have provided  the pt with a written personalized care plan for preventive services.  I have provided the patient with a copy of your personalized plan for preventive services. Instructed to take the time to review  along with their updated medication list.  Follow-up: No Follow-up on file. Or follow-up in 1 year for complete physical examination  Modified Medications   Modified Medication Previous Medication   OMEPRAZOLE (PRILOSEC) 20 MG CAPSULE omeprazole (PRILOSEC) 20 MG capsule      Take 1 capsule (20 mg total) by mouth daily.    Take 20 mg by mouth daily.    Signed,  Maud Deed. Nigel Ericsson, MD   Patient's Medications  New Prescriptions   No medications on file  Previous Medications   AMLODIPINE (NORVASC) 10 MG TABLET    take 1 tablet by mouth once daily   ASPIRIN 325 MG TABLET    Take 325 mg by mouth daily.     ATORVASTATIN (LIPITOR) 80 MG TABLET    take 1 tablet by mouth once daily   FISH OIL-OMEGA-3 FATTY ACIDS 1000 MG CAPSULE    Take 2 g by mouth daily.     LOSARTAN (COZAAR) 50 MG TABLET    take 1 tablet by mouth daily   METFORMIN (GLUCOPHAGE-XR) 500 MG 24 HR TABLET    take 2 tablets by mouth once daily WITH BREAKFAST   METOPROLOL (LOPRESSOR) 50 MG TABLET    take 1 tablet by mouth twice a day   MULTIPLE VITAMIN (MULTIVITAMIN) TABLET    Take 1 tablet by mouth daily.    Modified Medications   Modified Medication Previous Medication   OMEPRAZOLE (PRILOSEC) 20 MG CAPSULE omeprazole (PRILOSEC) 20 MG capsule      Take 1 capsule (20 mg total) by mouth daily.    Take 20 mg by mouth daily.  Discontinued Medications   No medications on file

## 2016-06-19 DIAGNOSIS — E119 Type 2 diabetes mellitus without complications: Secondary | ICD-10-CM | POA: Diagnosis not present

## 2016-06-19 LAB — HM DIABETES EYE EXAM

## 2016-06-25 ENCOUNTER — Encounter: Payer: Self-pay | Admitting: Family Medicine

## 2016-08-26 ENCOUNTER — Other Ambulatory Visit: Payer: Self-pay | Admitting: Family Medicine

## 2016-08-31 ENCOUNTER — Other Ambulatory Visit: Payer: Self-pay | Admitting: Family Medicine

## 2016-09-12 ENCOUNTER — Other Ambulatory Visit: Payer: Self-pay | Admitting: Family Medicine

## 2016-12-12 NOTE — Progress Notes (Signed)
Cardiology Office Note  Date:  12/13/2016   ID:  [name], DOB 1946-01-10, MRN [id number]  PCP:  [name], MD   Chief Complaint  Patient presents with  . OTHER    6 month f/u c/o joint pain discuss atorvastatin and prilosec. Meds reviewed verbally with pt.    HPI:   [name] is a very pleasant 71 yo gentleman with remote history of  coronary artery disease, bypass surgery in 2006 x5 vessel after he developed anginal symptoms with pain radiating to his jaw with exertion,  hyperlipidemia,  diabetes,  hypertension,  Continues to drink some alcohol daily who presents for routine followup of his coronary artery disease  In follow-up, he reports that he is doing well  he does have worsening GERD sx, was taking omeprazole  he stopped the mdication after reading it ould cause wit  Wrist pain,  perhaps better Now with GERD  has been taking lots of tums  For relief  Denies any symptoms concerning for chest pain or jaw pain No shortness of breath He is taking Lipitor 80 alternating with 40 mg daily  No regular exercise program  Weight is up over the past year  EKG personally reviewed by myself on todays visit  showing sinus bradycardia, rate 59 bpm, no significant ST or T-wave changes  Other past medical history  labs dated 05/18/2014 showing total cholesterol 126, LDL 62, hemoglobin A1c 7.0 He reports that he is alternating Lipitor 80 mg with 40 mg   He has not had a cardiac catheterization since his bypass surgery   PMH:   has a past medical history of Alcohol abuse, unspecified; CAD (coronary artery disease) (9/06); Colon polyps; Depression; DMII (diabetes mellitus, type 2) ([place]) (10/03); HLD (hyperlipidemia) (10/03); and HTN (hypertension) (10/03).  PSH:    Past Surgical History:  Procedure Laterality Date  . COLONOSCOPY    . CORONARY ARTERY BYPASS GRAFT  9/06   x4  . ESOPHAGOGASTRODUODENOSCOPY  7/07   polyp; mucosal abnml  . TONSILLECTOMY       Current Outpatient Prescriptions  Medication Sig Dispense Refill  . amLODipine (NORVASC) 10 MG tablet take 1 tablet by mouth once daily 90 tablet 2  . aspirin 325 MG tablet Take 325 mg by mouth daily.      Marland Kitchen atorvastatin (LIPITOR) 80 MG tablet take 1 tablet by mouth once daily 90 tablet 2  . calcium carbonate (TUMS - DOSED IN MG ELEMENTAL CALCIUM) 500 MG chewable tablet Chew 1 tablet by mouth daily as needed for indigestion or heartburn.    . fish oil-omega-3 fatty acids 1000 MG capsule Take 2 g by mouth daily.      Marland Kitchen losartan (COZAAR) 50 MG tablet take 1 tablet by mouth once daily 90 tablet 2  . metFORMIN (GLUCOPHAGE-XR) 500 MG 24 hr tablet take 2 tablets by mouth once daily WITH BREAKFAST 180 tablet 2  . metoprolol (LOPRESSOR) 50 MG tablet take 1 tablet by mouth twice a day 180 tablet 3  . Multiple Vitamin (MULTIVITAMIN) tablet Take 1 tablet by mouth daily.       No current facility-administered medications for this visit.      Allergies:   Sulfonamide derivatives   Social History:  The patient  reports that he has quit smoking. He has never used smokeless tobacco. He reports that he drinks about 9.0 oz of alcohol per week . He reports that he does not use drugs.   Family History:   family history  includes Alcohol abuse in his brother and father; Anxiety disorder in his brother; COPD in his mother; Cirrhosis in his brother; Heart disease in his father and paternal grandmother; Other in his father; Stroke in his father.    Review of Systems: Review of Systems  Constitutional: Negative.   Respiratory: Negative.   Cardiovascular: Negative.   Gastrointestinal: Positive for heartburn.  Musculoskeletal: Positive for joint pain.  Neurological: Negative.   Psychiatric/Behavioral: Negative.   All other systems reviewed and are negative.    PHYSICAL EXAM: VS:  BP 140/80 (BP Location: Left Arm, Patient Position: Sitting, Cuff Size: Normal)   Pulse (!) 59   Ht 5\' 8"  (1.727 m)   Wt  181 lb (82.1 kg)   BMI 27.52 kg/m  , BMI Body mass index is 27.52 kg/m. GEN: Well nourished, well developed, in no acute distress  HEENT: normal  Neck: no JVD, carotid bruits, or masses Cardiac: RRR; no murmurs, rubs, or gallops,no edema  Respiratory:  clear to auscultation bilaterally, normal work of breathing GI: soft, nontender, nondistended, + BS MS: no deformity or atrophy  Skin: warm and dry, no rash Neuro:  Strength and sensation are intact Psych: euthymic mood, full affect    Recent Labs: 06/04/2016: ALT 34; BUN 16; Creatinine, Ser 1.20; Hemoglobin 15.5; Platelets 183.0; Potassium 4.9; Sodium 139    Lipid Panel Lab Results  Component Value Date   CHOL 118 06/04/2016   HDL 31.10 (L) 06/04/2016   LDLCALC 62 05/18/2014   TRIG 366.0 (H) 06/04/2016      Wt Readings from Last 3 Encounters:  12/13/16 181 lb (82.1 kg)  06/06/16 178 lb 4 oz (80.9 kg)  05/15/16 179 lb 8 oz (81.4 kg)       ASSESSMENT AND PLAN:   Coronary artery disease involving autologous vein coronary bypass graft without angina pectoris - Plan: EKG 12-Lead Currently with no symptoms of angina. No further workup at this time. Continue current medication regimen.  Essential hypertension - Plan: EKG 12-Lead Blood pressure is well controlled on today's visit. No changes made to the medications.  Hyperlipidemia LDL goal <70 Cholesterol is at goal on the current lipid regimen. No changes to the medications were made.  Uncontrolled type 2 diabetes mellitus with other circulatory complication, without long-term current use of insulin ([place]) We have encouraged continued exercise, careful diet management in an effort to lose weight.  Alcohol abuse Suggested moderating his alcohol intake  Joint pain In the thumbs, feet  stopped omeprazole  On own, thinking this was contributing  GERDX Recommended he start taking Pepcid twice a day If no relief of symptoms, could try alternate PPI    Total  encounter time more than 25 minutes  Greater than 50% was spent in counseling and coordination of care with the patient   Disposition:   F/U  6 months   Orders Placed This Encounter  Procedures  . EKG 12-Lead     Signed, Esmond Plants, M.D., Ph.D. 12/13/2016  [place], [place]

## 2016-12-13 ENCOUNTER — Ambulatory Visit (INDEPENDENT_AMBULATORY_CARE_PROVIDER_SITE_OTHER): Payer: Medicare HMO | Admitting: Cardiovascular Disease

## 2016-12-13 ENCOUNTER — Encounter: Payer: Self-pay | Admitting: Cardiovascular Disease

## 2016-12-13 VITALS — BP 140/80 | HR 59 | Ht 68.0 in | Wt 181.0 lb

## 2016-12-13 DIAGNOSIS — I10 Essential (primary) hypertension: Secondary | ICD-10-CM

## 2016-12-13 DIAGNOSIS — E1165 Type 2 diabetes mellitus with hyperglycemia: Secondary | ICD-10-CM | POA: Diagnosis not present

## 2016-12-13 DIAGNOSIS — E785 Hyperlipidemia, unspecified: Secondary | ICD-10-CM

## 2016-12-13 DIAGNOSIS — F1010 Alcohol abuse, uncomplicated: Secondary | ICD-10-CM

## 2016-12-13 DIAGNOSIS — IMO0002 Reserved for concepts with insufficient information to code with codable children: Secondary | ICD-10-CM

## 2016-12-13 DIAGNOSIS — E1159 Type 2 diabetes mellitus with other circulatory complications: Secondary | ICD-10-CM | POA: Diagnosis not present

## 2016-12-13 DIAGNOSIS — I25718 Atherosclerosis of autologous vein coronary artery bypass graft(s) with other forms of angina pectoris: Secondary | ICD-10-CM

## 2016-12-13 NOTE — Patient Instructions (Addendum)
Medication Instructions:   No medication changes made  Try 1 to 2 ranitidine/famotidine twice a day  Labwork:  No new labs needed  Testing/Procedures:  No further testing at this time   I recommend watching educational videos on topics of interest to you at:       www.goemmi.com  Enter code: HEARTCARE    Follow-Up: It was a pleasure seeing you in the office today. Please call us if you have new issues that need to be addressed before your next appt.  [phone]  Your physician wants you to follow-up in: 6 months.  You will receive a reminder letter in the mail two months in advance. If you don't receive a letter, please call our office to schedule the follow-up appointment.  If you need a refill on your cardiac medications before your next appointment, please call your pharmacy.

## 2017-03-01 ENCOUNTER — Other Ambulatory Visit: Payer: Self-pay | Admitting: Family Medicine

## 2017-05-29 ENCOUNTER — Other Ambulatory Visit: Payer: Self-pay | Admitting: Family Medicine

## 2017-06-04 ENCOUNTER — Other Ambulatory Visit: Payer: Self-pay | Admitting: Family Medicine

## 2017-06-04 DIAGNOSIS — Z0000 Encounter for general adult medical examination without abnormal findings: Secondary | ICD-10-CM

## 2017-06-04 DIAGNOSIS — E119 Type 2 diabetes mellitus without complications: Secondary | ICD-10-CM

## 2017-06-04 DIAGNOSIS — E785 Hyperlipidemia, unspecified: Secondary | ICD-10-CM

## 2017-06-05 ENCOUNTER — Other Ambulatory Visit: Payer: Commercial Managed Care - HMO

## 2017-06-07 NOTE — Progress Notes (Signed)
Cardiology Office Note  Date:  06/11/2017   ID:  [name], DOB 06-Jun-1946, MRN [id number]  PCP:  [name], MD   Chief Complaint  Patient presents with  . other    36mo f/u. Pt states he is doing well. Reviewed meds with pt verbally.    HPI:   Mr. [name] is a very pleasant 71 yo gentleman with remote history of  coronary artery disease, bypass surgery in 2006 x5 vessel after he developed anginal symptoms with pain radiating to his jaw with exertion,  hyperlipidemia,  diabetes,  hypertension,  Continues to drink some alcohol daily who presents for routine followup of his coronary artery disease  In follow-up, he reports that he is doing well Denies any significant chest pain concerning for angina No shortness of breath He is taking Lipitor 80 alternating with 40 mg daily  Scheduled to have lab work tomorrow No regular exercise program Trying to watch his diet but reports that he eats out most meals as it is "just the 2 of them"  EKG personally reviewed by myself on todays visit  showing sinus bradycardia, rate 58 bpm, no significant ST or T-wave changes  Other past medical history  labs dated 05/18/2014 showing total cholesterol 126, LDL 62, hemoglobin A1c 7.0 He reports that he is alternating Lipitor 80 mg with 40 mg   He has not had a cardiac catheterization since his bypass surgery   PMH:   has a past medical history of Alcohol abuse, unspecified; CAD (coronary artery disease) (9/06); Colon polyps; Depression; DMII (diabetes mellitus, type 2) ([place]) (10/03); HLD (hyperlipidemia) (10/03); and HTN (hypertension) (10/03).  PSH:    Past Surgical History:  Procedure Laterality Date  . COLONOSCOPY    . CORONARY ARTERY BYPASS GRAFT  9/06   x4  . ESOPHAGOGASTRODUODENOSCOPY  7/07   polyp; mucosal abnml  . TONSILLECTOMY      Current Outpatient Prescriptions  Medication Sig Dispense Refill  . amLODipine (NORVASC) 10 MG tablet take 1 tablet by mouth once daily  90 tablet 0  . aspirin 325 MG tablet Take 325 mg by mouth daily.      Marland Kitchen atorvastatin (LIPITOR) 80 MG tablet take 1 tablet by mouth once daily 90 tablet 2  . calcium carbonate (TUMS - DOSED IN MG ELEMENTAL CALCIUM) 500 MG chewable tablet Chew 1 tablet by mouth daily as needed for indigestion or heartburn.    . fish oil-omega-3 fatty acids 1000 MG capsule Take 2 g by mouth daily.      Marland Kitchen losartan (COZAAR) 50 MG tablet take 1 tablet by mouth once daily 90 tablet 0  . metFORMIN (GLUCOPHAGE-XR) 500 MG 24 hr tablet take 2 tablets by mouth once daily WITH BREAKFAST 180 tablet 0  . metoprolol tartrate (LOPRESSOR) 50 MG tablet take 1 tablet by mouth twice a day 180 tablet 0  . Multiple Vitamin (MULTIVITAMIN) tablet Take 1 tablet by mouth daily.       No current facility-administered medications for this visit.      Allergies:   Sulfonamide derivatives   Social History:  The patient  reports that he has quit smoking. He has never used smokeless tobacco. He reports that he drinks about 9.0 oz of alcohol per week . He reports that he does not use drugs.   Family History:   family history includes Alcohol abuse in his brother and father; Anxiety disorder in his brother; COPD in his mother; Cirrhosis in his brother; Heart disease in his  father and paternal grandmother; Other in his father; Stroke in his father.    Review of Systems: Review of Systems  Constitutional: Negative.   Respiratory: Negative.   Cardiovascular: Negative.   Gastrointestinal: Negative.   Musculoskeletal: Positive for joint pain.  Neurological: Negative.   Psychiatric/Behavioral: Negative.   All other systems reviewed and are negative.    PHYSICAL EXAM: VS:  BP (!) 150/80 (BP Location: Left Arm, Patient Position: Sitting, Cuff Size: Normal)   Pulse (!) 58   Ht 5\' 7"  (1.702 m)   Wt 179 lb (81.2 kg)   BMI 28.04 kg/m  , BMI Body mass index is 28.04 kg/m.  No significant change since last office visit GEN: Well  nourished, well developed, in no acute distress  HEENT: normal  Neck: no JVD, carotid bruits, or masses Cardiac: RRR; no murmurs, rubs, or gallops,no edema  Respiratory:  clear to auscultation bilaterally, normal work of breathing GI: soft, nontender, nondistended, + BS MS: no deformity or atrophy  Skin: warm and dry, no rash Neuro:  Strength and sensation are intact Psych: euthymic mood, full affect  Recent Labs: No results found for requested labs within last 8760 hours.    Lipid Panel Lab Results  Component Value Date   CHOL 118 06/04/2016   HDL 31.10 (L) 06/04/2016   LDLCALC 62 05/18/2014   TRIG 366.0 (H) 06/04/2016      Wt Readings from Last 3 Encounters:  06/11/17 179 lb (81.2 kg)  12/13/16 181 lb (82.1 kg)  06/06/16 178 lb 4 oz (80.9 kg)       ASSESSMENT AND PLAN:   Coronary artery disease involving autologous vein coronary bypass graft without angina pectoris - Plan: EKG 12-Lead Currently with no symptoms of angina. No further workup at this time. Continue current medication regimen.  Stable  Essential hypertension - Plan: EKG 12-Lead Blood pressure is well controlled on today's visit. No changes made to the medications. Recheck of blood pressure slightly elevated 166 systolic, recommended he monitor blood pressure at home Recommended weight loss  Hyperlipidemia LDL goal <70 Cholesterol is at goal on the current lipid regimen. No changes to the medications were made. Lab work pending tomorrow  Uncontrolled type 2 diabetes mellitus with other circulatory complication, without long-term current use of insulin ([place]) We have encouraged continued exercise, careful diet management in an effort to lose weight. Discussed his dietary habits, eating out most meals  Alcohol abuse Suggested moderating his alcohol intake  GERDX Stopped omeprazole on his own in the past, was taking Pepcid    Total encounter time more than 25 minutes  Greater than 50% was spent in  counseling and coordination of care with the patient   Disposition:   F/U  12 months   Orders Placed This Encounter  Procedures  . EKG 12-Lead     Signed, Esmond Plants, M.D., Ph.D. 06/11/2017  [place], [place]

## 2017-06-10 ENCOUNTER — Encounter: Payer: Commercial Managed Care - HMO | Admitting: Family Medicine

## 2017-06-11 ENCOUNTER — Encounter: Payer: Self-pay | Admitting: Cardiovascular Disease

## 2017-06-11 ENCOUNTER — Ambulatory Visit (INDEPENDENT_AMBULATORY_CARE_PROVIDER_SITE_OTHER): Payer: Medicare HMO | Admitting: Cardiovascular Disease

## 2017-06-11 VITALS — BP 143/75 | HR 58 | Ht 67.0 in | Wt 179.0 lb

## 2017-06-11 DIAGNOSIS — E1165 Type 2 diabetes mellitus with hyperglycemia: Secondary | ICD-10-CM

## 2017-06-11 DIAGNOSIS — F1010 Alcohol abuse, uncomplicated: Secondary | ICD-10-CM

## 2017-06-11 DIAGNOSIS — I25718 Atherosclerosis of autologous vein coronary artery bypass graft(s) with other forms of angina pectoris: Secondary | ICD-10-CM | POA: Diagnosis not present

## 2017-06-11 DIAGNOSIS — E785 Hyperlipidemia, unspecified: Secondary | ICD-10-CM | POA: Diagnosis not present

## 2017-06-11 DIAGNOSIS — I10 Essential (primary) hypertension: Secondary | ICD-10-CM

## 2017-06-11 MED ORDER — METOPROLOL TARTRATE 50 MG PO TABS: 50.0000 mg | ORAL_TABLET | Freq: Two times a day (BID) | ORAL | 3 refills | Status: DC

## 2017-06-11 MED ORDER — ATORVASTATIN CALCIUM 80 MG PO TABS: 80.0000 mg | ORAL_TABLET | Freq: Every day | ORAL | 3 refills | Status: DC

## 2017-06-11 MED ORDER — AMLODIPINE BESYLATE 10 MG PO TABS: 10.0000 mg | ORAL_TABLET | Freq: Every day | ORAL | 3 refills | Status: DC

## 2017-06-11 MED ORDER — LOSARTAN POTASSIUM 50 MG PO TABS: 50.0000 mg | ORAL_TABLET | Freq: Every day | ORAL | 3 refills | Status: DC

## 2017-06-11 NOTE — Patient Instructions (Signed)

## 2017-06-13 ENCOUNTER — Encounter: Payer: Self-pay | Admitting: Family Medicine

## 2017-06-13 ENCOUNTER — Ambulatory Visit (INDEPENDENT_AMBULATORY_CARE_PROVIDER_SITE_OTHER): Payer: Medicare HMO | Admitting: Family Medicine

## 2017-06-13 VITALS — BP 140/78 | HR 57 | Temp 98.4°F | Ht 66.0 in | Wt 174.8 lb

## 2017-06-13 DIAGNOSIS — Z23 Encounter for immunization: Secondary | ICD-10-CM

## 2017-06-13 DIAGNOSIS — E119 Type 2 diabetes mellitus without complications: Secondary | ICD-10-CM

## 2017-06-13 DIAGNOSIS — Z0000 Encounter for general adult medical examination without abnormal findings: Secondary | ICD-10-CM | POA: Diagnosis not present

## 2017-06-13 DIAGNOSIS — Z125 Encounter for screening for malignant neoplasm of prostate: Secondary | ICD-10-CM

## 2017-06-13 DIAGNOSIS — E785 Hyperlipidemia, unspecified: Secondary | ICD-10-CM | POA: Diagnosis not present

## 2017-06-13 LAB — CBC WITH DIFFERENTIAL/PLATELET
BASOS ABS: 0 10*3/uL (ref 0.0–0.1)
Basophils Relative: 0.9 % (ref 0.0–3.0)
EOS PCT: 3.7 % (ref 0.0–5.0)
Eosinophils Absolute: 0.2 10*3/uL (ref 0.0–0.7)
HCT: 45.5 % (ref 39.0–52.0)
Hemoglobin: 15.8 g/dL (ref 13.0–17.0)
LYMPHS ABS: 1.6 10*3/uL (ref 0.7–4.0)
Lymphocytes Relative: 32.5 % (ref 12.0–46.0)
MCHC: 34.6 g/dL (ref 30.0–36.0)
MCV: 92.1 fl (ref 78.0–100.0)
MONO ABS: 0.6 10*3/uL (ref 0.1–1.0)
MONOS PCT: 11.4 % (ref 3.0–12.0)
NEUTROS PCT: 51.5 % (ref 43.0–77.0)
Neutro Abs: 2.5 10*3/uL (ref 1.4–7.7)
PLATELETS: 172 10*3/uL (ref 150.0–400.0)
RBC: 4.94 Mil/uL (ref 4.22–5.81)
RDW: 12.8 % (ref 11.5–15.5)
WBC: 4.9 10*3/uL (ref 4.0–10.5)

## 2017-06-13 LAB — HEPATIC FUNCTION PANEL
ALBUMIN: 4.7 g/dL (ref 3.5–5.2)
ALT: 36 U/L (ref 0–53)
AST: 22 U/L (ref 0–37)
Alkaline Phosphatase: 46 U/L (ref 39–117)
Bilirubin, Direct: 0.2 mg/dL (ref 0.0–0.3)
TOTAL PROTEIN: 7.5 g/dL (ref 6.0–8.3)
Total Bilirubin: 1 mg/dL (ref 0.2–1.2)

## 2017-06-13 LAB — LIPID PANEL
CHOLESTEROL: 116 mg/dL (ref 0–200)
HDL: 30.7 mg/dL — AB (ref >39.00)
NonHDL: 85.27
Total CHOL/HDL Ratio: 4
Triglycerides: 234 mg/dL — ABNORMAL HIGH (ref 0.0–149.0)
VLDL: 46.8 mg/dL — AB (ref 0.0–40.0)

## 2017-06-13 LAB — BASIC METABOLIC PANEL
BUN: 16 mg/dL (ref 6–23)
CALCIUM: 10.5 mg/dL (ref 8.4–10.5)
CO2: 26 meq/L (ref 19–32)
Chloride: 101 mEq/L (ref 96–112)
Creatinine, Ser: 1.18 mg/dL (ref 0.40–1.50)
GFR: 64.67 mL/min (ref >60.00)
GLUCOSE: 209 mg/dL — AB (ref 70–99)
Potassium: 4.4 mEq/L (ref 3.5–5.1)
Sodium: 140 mEq/L (ref 135–145)

## 2017-06-13 LAB — MICROALBUMIN / CREATININE URINE RATIO
Creatinine,U: 69.4 mg/dL
MICROALB/CREAT RATIO: 1.7 mg/g (ref 0.0–30.0)
Microalb, Ur: 1.2 mg/dL (ref 0.0–1.9)

## 2017-06-13 LAB — PSA, MEDICARE: PSA: 2.25 ng/ml (ref 0.10–4.00)

## 2017-06-13 LAB — HEMOGLOBIN A1C: HEMOGLOBIN A1C: 9.3 % — AB (ref 4.6–6.5)

## 2017-06-13 LAB — LDL CHOLESTEROL, DIRECT: LDL DIRECT: 53 mg/dL

## 2017-06-13 NOTE — Progress Notes (Signed)
Dr. Frederico Hamman T. Kate Sweetman, MD, [facility] [place], [address] Phone: [phone] Fax: [phone]  06/13/2017  Patient: [name], MRN: [id number], DOB: [birth year], 71 y.o.  Primary Physician:  [name], MD   Chief Complaint  Patient presents with  . Medicare Wellness   Subjective:   [name] is a 71 y.o. pleasant patient who presents for a medicare wellness examination and general CPX:  Preventative Health Maintenance Visit:  Health Maintenance Summary Reviewed and updated, unless pt declines services.  Tobacco History Reviewed. Alcohol: 3-4 per night Exercise Habits: Some activity, rec at least 30 mins 5 times a week STD concerns: no risk or activity to increase risk Drug Use: None Encouraged self-testicular check  Check all labs Flu check F/u cataract check  Health Maintenance  Topic Date Due  . FOOT EXAM  05/30/2016  . HEMOGLOBIN A1C  12/03/2016  . OPHTHALMOLOGY EXAM  06/19/2017  . COLONOSCOPY  07/19/2017  . TETANUS/TDAP  04/13/2019  . INFLUENZA VACCINE  Completed  . Hepatitis C Screening  Completed  . PNA vac Low Risk Adult  Completed    Immunization History  Administered Date(s) Administered  . Influenza Split 08/02/2011, 06/11/2012  . Influenza Whole 08/10/2010  . Influenza, High Dose Seasonal PF 05/18/2014  . Influenza,inj,Quad PF,6+ Mos 05/25/2013, 05/30/2015, 06/13/2017  . Influenza-Unspecified 05/16/2016  . Pneumococcal Conjugate-13 05/30/2015  . Pneumococcal Polysaccharide-23 10/11/2011  . Td 08/20/1993, 04/12/2009    Patient Active Problem List   Diagnosis Date Noted  . CAD (coronary artery disease), autologous vein bypass graft 10/11/2011    Priority: High  . Diabetes type 2, uncontrolled ([place]) 04/13/2009    Priority: High  . HTN (hypertension)     Priority: Medium  . Hyperlipidemia LDL goal <70 05/13/2008    Priority: Medium  . Alcohol abuse 08/10/2010    . History of colonic polyps 03/12/2007  . DEPRESSION 12/03/2006   Past Medical History:  Diagnosis Date  . Alcohol abuse, unspecified   . CAD (coronary artery disease) 9/06   CABG x4  . Colon polyps   . Depression   . DMII (diabetes mellitus, type 2) ([place]) 10/03  . HLD (hyperlipidemia) 10/03  . HTN (hypertension) 10/03   Past Surgical History:  Procedure Laterality Date  . COLONOSCOPY    . CORONARY ARTERY BYPASS GRAFT  9/06   x4  . ESOPHAGOGASTRODUODENOSCOPY  7/07   polyp; mucosal abnml  . TONSILLECTOMY     Social History   Social History  . Marital status: Married    Spouse name: N/A  . Number of children: N/A  . Years of education: N/A   Occupational History  . Not on file.   Social History Main Topics  . Smoking status: Former Research scientist (life sciences)  . Smokeless tobacco: Never Used     Comment: quit 41 years ago  . Alcohol use 9.0 oz/week    15 Shots of liquor per week     Comment: Regular  . Drug use: No  . Sexual activity: Not on file   Other Topics Concern  . Not on file   Social History Narrative   Cares for his wife who is disabled with dementia         Family History  Problem Relation Age of Onset  . Alcohol abuse Father   . Stroke Father   . Other Father        cardiac problems  . Heart disease Father   .  COPD Mother   . Alcohol abuse Brother   . Cirrhosis Brother        EtOH  . Anxiety disorder Brother   . Heart disease Paternal Grandmother   . Colon cancer Neg Hx   . Esophageal cancer Neg Hx   . Rectal cancer Neg Hx   . Stomach cancer Neg Hx    Allergies  Allergen Reactions  . Sulfonamide Derivatives     REACTION: unknown reaction    Medication list has been reviewed and updated.   General: Denies fever, chills, sweats. No significant weight loss. Eyes: Denies blurring,significant itching ENT: Denies earache, sore throat, and hoarseness. Cardiovascular: Denies chest pains, palpitations, dyspnea on exertion Respiratory: Denies cough,  dyspnea at rest,wheeezing Breast: no concerns about lumps GI: Denies nausea, vomiting, diarrhea, constipation, change in bowel habits, abdominal pain, melena, hematochezia GU: Denies penile discharge, ED, urinary flow / outflow problems. No STD concerns. Musculoskeletal: Denies back pain, joint pain Derm: Denies rash, itching Neuro: Denies  paresthesias, frequent falls, frequent headaches Psych: Denies depression, anxiety Endocrine: Denies cold intolerance, heat intolerance, polydipsia Heme: Denies enlarged lymph nodes Allergy: No hayfever  Objective:   BP 140/78   Pulse (!) 57   Temp 98.4 F (36.9 C) (Oral)   Ht 5' 6"  (1.676 m)   Wt 174 lb 12 oz (79.3 kg)   BMI 28.21 kg/m   The patient completed a fall screen and PHQ-2 and PHQ-9 if necessary, which is documented in the EHR. The CMA/LPN/RN who assisted the patient verbally completed with them and documented results in [place].   Hearing Screening   Method: Audiometry   125Hz  250Hz  500Hz  1000Hz  2000Hz  3000Hz  4000Hz  6000Hz  8000Hz   Right ear:   20 20 20   0    Left ear:   20 20 20   40      Visual Acuity Screening   Right eye Left eye Both eyes  Without correction: 20/50 20/30 20/20   With correction:       GEN: well developed, well nourished, no acute distress Eyes: conjunctiva and lids normal, PERRLA, EOMI ENT: TM clear, nares clear, oral exam WNL Neck: supple, no lymphadenopathy, no thyromegaly, no JVD Pulm: clear to auscultation and percussion, respiratory effort normal CV: regular rate and rhythm, S1-S2, no murmur, rub or gallop, no bruits, peripheral pulses normal and symmetric, no cyanosis, clubbing, edema or varicosities GI: soft, non-tender; no hepatosplenomegaly, masses; active bowel sounds all quadrants GU: no hernia, testicular mass, penile discharge Lymph: no cervical, axillary or inguinal adenopathy MSK: gait normal, muscle tone and strength WNL, no joint swelling, effusions, discoloration, crepitus    SKIN: clear, good turgor, color WNL, no rashes, lesions, or ulcerations Neuro: normal mental status, normal strength, sensation, and motion Psych: alert; oriented to person, place and time, normally interactive and not anxious or depressed in appearance.  All labs reviewed with patient.  Lipids:    Component Value Date/Time   CHOL 118 06/04/2016 1120   TRIG 366.0 (H) 06/04/2016 1120   HDL 31.10 (L) 06/04/2016 1120   LDLDIRECT 46.0 06/04/2016 1120   VLDL 73.2 (H) 06/04/2016 1120   CHOLHDL 4 06/04/2016 1120   CBC: CBC Latest Ref Rng & Units 06/04/2016 05/26/2015 05/18/2014  WBC 4.0 - 10.5 K/uL 6.0 5.7 5.5  Hemoglobin 13.0 - 17.0 g/dL 15.5 16.0 16.0  Hematocrit 39.0 - 52.0 % 44.4 47.0 46.0  Platelets 150.0 - 400.0 K/uL 183.0 184.0 335.4    Basic Metabolic Panel:    Component Value  Date/Time   NA 139 06/04/2016 1120   K 4.9 06/04/2016 1120   CL 103 06/04/2016 1120   CO2 28 06/04/2016 1120   BUN 16 06/04/2016 1120   CREATININE 1.20 06/04/2016 1120   GLUCOSE 157 (H) 06/04/2016 1120   CALCIUM 9.8 06/04/2016 1120   Hepatic Function Latest Ref Rng & Units 06/04/2016 05/26/2015 05/18/2014  Total Protein 6.0 - 8.3 g/dL 7.3 7.1 7.1  Albumin 3.5 - 5.2 g/dL 4.7 4.5 4.5  AST 0 - 37 U/L 21 21 27   ALT 0 - 53 U/L 34 33 44  Alk Phosphatase 39 - 117 U/L 55 54 43  Total Bilirubin 0.2 - 1.2 mg/dL 0.9 1.0 1.2  Bilirubin, Direct 0.0 - 0.3 mg/dL 0.2 0.2 0.2    Lab Results  Component Value Date   TSH 1.67 08/10/2010   Lab Results  Component Value Date   PSA 2.87 06/04/2016   PSA 0.86 05/26/2015   PSA 0.79 05/18/2014    Assessment and Plan:   Routine general medical examination at a health care facility - Plan: Hepatic function panel, Lipid panel, Basic metabolic panel, CBC with Differential/Platelet, Hemoglobin A1c, Microalbumin / creatinine urine ratio, PSA, Medicare  Hyperlipidemia, unspecified hyperlipidemia type - Plan: Lipid panel  Diabetes mellitus without complication ([place])  - Plan: Hemoglobin A1c, Microalbumin / creatinine urine ratio  Need for prophylactic vaccination and inoculation against influenza - Plan: Flu Vaccine QUAD 36+ mos IM  Health Maintenance Exam: The patient's preventative maintenance and recommended screening tests for an annual wellness exam were reviewed in full today. Brought up to date unless services declined.  Counselled on the importance of diet, exercise, and its role in overall health and mortality. The patient's FH and SH was reviewed, including their home life, tobacco status, and drug and alcohol status.  Follow-up in 1 year for physical exam or additional follow-up below.  I have personally reviewed the Medicare Annual Wellness questionnaire and have noted 1. The patient's medical and social history 2. Their use of alcohol, tobacco or illicit drugs 3. Their current medications and supplements 4. The patient's functional ability including ADL's, fall risks, home safety risks and hearing or visual             impairment. 5. Diet and physical activities 6. Evidence for depression or mood disorders 7. Reviewed Updated provider list, see scanned forms and [facility] Snapshot.   The patients weight, height, BMI and visual acuity have been recorded in the chart I have made referrals, counseling and provided education to the patient based review of the above and I have provided the pt with a written personalized care plan for preventive services.  I have provided the patient with a copy of your personalized plan for preventive services. Instructed to take the time to review along with their updated medication list.  Follow-up: No Follow-up on file. Or follow-up in 1 year if not noted.  Future Appointments Date Time Provider [place]  06/16/2018 9:45 AM Eustace Pen, LPN [facility]  61/01/8371 10:30 AM Jantzen Pilger, Frederico Hamman, MD [facility]    No orders of the defined types were placed in this encounter.  There are no  discontinued medications. Orders Placed This Encounter  Procedures  . Flu Vaccine QUAD 36+ mos IM    Signed,  Eyan Hagood T. Jimya Ciani, MD   Allergies as of 06/13/2017      Reactions   Sulfonamide Derivatives    REACTION: unknown reaction      Medication List  Accurate as of 06/13/17  2:32 PM. Always use your most recent med list.          amLODipine 10 MG tablet Commonly known as:  NORVASC Take 1 tablet (10 mg total) by mouth daily.   aspirin 81 MG tablet Take 1 tablet (81 mg total) by mouth daily.   atorvastatin 80 MG tablet Commonly known as:  LIPITOR Take 1 tablet (80 mg total) by mouth daily.   calcium carbonate 500 MG chewable tablet Commonly known as:  TUMS - dosed in mg elemental calcium Chew 1 tablet by mouth daily as needed for indigestion or heartburn.   fish oil-omega-3 fatty acids 1000 MG capsule Take 2 g by mouth daily.   losartan 50 MG tablet Commonly known as:  COZAAR Take 1 tablet (50 mg total) by mouth daily.   metFORMIN 500 MG 24 hr tablet Commonly known as:  GLUCOPHAGE-XR take 2 tablets by mouth once daily WITH BREAKFAST   metoprolol tartrate 50 MG tablet Commonly known as:  LOPRESSOR Take 1 tablet (50 mg total) by mouth 2 (two) times daily.   multivitamin tablet Take 1 tablet by mouth daily.

## 2017-06-17 ENCOUNTER — Telehealth: Payer: Self-pay | Admitting: *Deleted

## 2017-06-17 MED ORDER — METFORMIN HCL ER 500 MG PO TB24: 2000.0000 mg | ORAL_TABLET | Freq: Every day | ORAL | 3 refills | Status: DC

## 2017-06-17 NOTE — Telephone Encounter (Signed)
-----   Message from [name], MD sent at 06/17/2017  2:27 PM EDT ----- Call  Can you have him increase his metformin xr 500 mg, to 3 tabs for 2 weeks, then increase to 4 tablets each morning.   Will need to send in a new script.   Metformin XR 500 mg, 4 tabs po q AM. # 360, 3 ref  F/u with me in 3 months

## 2017-06-17 NOTE — Telephone Encounter (Signed)
Mr. Vanduyne notified as instructed by telephone.  Metformin Rx sent into [facility] as instructed by Dr. Lorelei Pont.  Follow up appointment scheduled for 09/16/17 at 10:15 am with Dr. Lorelei Pont and fasting lab appointment 09/13/2017 at 10:15 am.

## 2017-06-18 ENCOUNTER — Telehealth: Payer: Self-pay | Admitting: Family Medicine

## 2017-06-18 DIAGNOSIS — E1165 Type 2 diabetes mellitus with hyperglycemia: Secondary | ICD-10-CM

## 2017-06-18 MED ORDER — ONETOUCH ULTRASOFT LANCETS MISC
3 refills | Status: DC
Start: 2017-06-18 — End: 2017-06-18

## 2017-06-18 MED ORDER — ONETOUCH ULTRA MINI W/DEVICE KIT: PACK | 0 refills | Status: DC

## 2017-06-18 MED ORDER — ACCU-CHEK AVIVA PLUS W/DEVICE KIT: PACK | 0 refills | Status: AC

## 2017-06-18 MED ORDER — ACCU-CHEK SOFTCLIX LANCETS MISC: 3 refills | Status: AC

## 2017-06-18 MED ORDER — GLUCOSE BLOOD VI STRP: ORAL_STRIP | 3 refills | Status: DC

## 2017-06-18 NOTE — Addendum Note (Signed)
Addended by: Helene Shoe on: 06/18/2017 04:22 PM   Modules accepted: Orders

## 2017-06-18 NOTE — Telephone Encounter (Signed)
Patient is calling to request a meter, strips and lancets to check his glucose levels. He is concerned with his A1C levels being elevated at his last visit. He would like to know how often to check and if one of those meters that you wear on the upper arm would be appropriate. If not a regular meter is fine.

## 2017-06-18 NOTE — Telephone Encounter (Signed)
Copied from [place] [id number]. Topic: Quick Communication - See Telephone Encounter >> Jun 18, 2017  2:29 PM Burnis Medin, NT wrote: CRM for notification. See Telephone encounter for:  06/18/17. Pt. Said blood sugar jumped high from 73 to 94. Pt is wanting to see if he can get a blood glucose device. Pt. Want's to know if he can have a prescription so he can monitor his sugars. Pt. Uses [facility] on [address].

## 2017-06-18 NOTE — Telephone Encounter (Signed)
Spoke with Mr. Ridings.  I advised I would be happy to send in Rx for glucose meter, test strips and lancets.  He should check his blood sugar once daily. I recommended that he do some FBS and 2 hr post prandials.  I advised he he wanted to do the arm device, we would have to referral to an endocrinologist to do that.  He would like [place] to send in Rx for meter and supplies.  Sent as requested to [facility] on [address] in [place].

## 2017-06-18 NOTE — Telephone Encounter (Signed)
Taren at [facility] transferred [facility] rd to [facility] and ins does not approve one touch so changed with verbal order to accu chek aviva meter, accu chek aviva plus test strips and softclix lancets. Updated med list also.

## 2017-06-25 DIAGNOSIS — E119 Type 2 diabetes mellitus without complications: Secondary | ICD-10-CM | POA: Diagnosis not present

## 2017-06-25 LAB — HM DIABETES EYE EXAM

## 2017-06-28 ENCOUNTER — Encounter: Payer: Self-pay | Admitting: Family Medicine

## 2017-08-16 ENCOUNTER — Encounter: Payer: Self-pay | Admitting: Internal Medicine

## 2017-08-21 ENCOUNTER — Other Ambulatory Visit: Payer: Self-pay | Admitting: Family Medicine

## 2017-08-29 ENCOUNTER — Other Ambulatory Visit: Payer: Self-pay | Admitting: Family Medicine

## 2017-09-13 ENCOUNTER — Other Ambulatory Visit (INDEPENDENT_AMBULATORY_CARE_PROVIDER_SITE_OTHER): Payer: Medicare HMO

## 2017-09-13 DIAGNOSIS — E785 Hyperlipidemia, unspecified: Secondary | ICD-10-CM

## 2017-09-13 DIAGNOSIS — E1165 Type 2 diabetes mellitus with hyperglycemia: Secondary | ICD-10-CM | POA: Diagnosis not present

## 2017-09-13 LAB — BASIC METABOLIC PANEL WITH GFR
BUN: 16 mg/dL (ref 6–23)
CO2: 29 meq/L (ref 19–32)
Calcium: 9.7 mg/dL (ref 8.4–10.5)
Chloride: 102 meq/L (ref 96–112)
Creatinine, Ser: 1.14 mg/dL (ref 0.40–1.50)
GFR: 67.24 mL/min (ref >60.00)
Glucose, Bld: 134 mg/dL — ABNORMAL HIGH (ref 70–99)
Potassium: 4.5 meq/L (ref 3.5–5.1)
Sodium: 139 meq/L (ref 135–145)

## 2017-09-13 LAB — LIPID PANEL
CHOLESTEROL: 101 mg/dL (ref 0–200)
HDL: 31.1 mg/dL — ABNORMAL LOW (ref >39.00)
LDL Cholesterol: 46 mg/dL (ref 0–99)
NONHDL: 70.3
Total CHOL/HDL Ratio: 3
Triglycerides: 124 mg/dL (ref 0.0–149.0)
VLDL: 24.8 mg/dL (ref 0.0–40.0)

## 2017-09-13 LAB — HEMOGLOBIN A1C: Hgb A1c MFr Bld: 6.1 % (ref 4.6–6.5)

## 2017-09-16 ENCOUNTER — Other Ambulatory Visit: Payer: Self-pay

## 2017-09-16 ENCOUNTER — Encounter: Payer: Self-pay | Admitting: Family Medicine

## 2017-09-16 ENCOUNTER — Ambulatory Visit (INDEPENDENT_AMBULATORY_CARE_PROVIDER_SITE_OTHER): Payer: Medicare HMO | Admitting: Family Medicine

## 2017-09-16 VITALS — BP 140/80 | HR 55 | Temp 97.8°F | Ht 66.0 in | Wt 170.2 lb

## 2017-09-16 DIAGNOSIS — N5201 Erectile dysfunction due to arterial insufficiency: Secondary | ICD-10-CM

## 2017-09-16 DIAGNOSIS — E118 Type 2 diabetes mellitus with unspecified complications: Secondary | ICD-10-CM

## 2017-09-16 MED ORDER — SILDENAFIL CITRATE 20 MG PO TABS: ORAL_TABLET | ORAL | 11 refills | Status: DC

## 2017-09-16 NOTE — Progress Notes (Signed)
Dr. Frederico Hamman T. Seletha Zimmermann, MD, [facility] [place], [address] Phone: [phone] Fax: [phone]  09/16/2017  Patient: [name], MRN: [id number], DOB: 1946-01-04, 72 y.o.  Primary Physician:  [name], MD   Chief Complaint  Patient presents with  . Diabetes   Subjective:   [name] is a 72 y.o. very pleasant male patient who presents with the following:  Going to the gym three days a week. Laying off the carbs. Lost weight. Walking a lot. Walking 10,000 steps.   Wt Readings from Last 3 Encounters:  09/16/17 170 lb 4 oz (77.2 kg)  06/13/17 174 lb 12 oz (79.3 kg)  06/11/17 179 lb (81.2 kg)    Diabetes Mellitus: Tolerating Medications: yes Compliance with diet: fair Exercise: minimal / intermittent Avg blood sugars at home: not checking Foot problems: none Hypoglycemia: none No nausea, vomitting, blurred vision, polyuria.  Lab Results  Component Value Date   HGBA1C 6.1 09/13/2017   HGBA1C 9.3 (H) 06/13/2017   HGBA1C 7.6 (H) 06/04/2016   Lab Results  Component Value Date   MICROALBUR 1.2 06/13/2017   LDLCALC 46 09/13/2017   CREATININE 1.14 09/13/2017    Wt Readings from Last 3 Encounters:  09/16/17 170 lb 4 oz (77.2 kg)  06/13/17 174 lb 12 oz (79.3 kg)  06/11/17 179 lb (81.2 kg)    Body mass index is 27.48 kg/m.   He has cut way back on his drinking. Revolutionary diet and exercise patterns.  Past Medical History, Surgical History, Social History, Family History, Problem List, Medications, and Allergies have been reviewed and updated if relevant.  Patient Active Problem List   Diagnosis Date Noted  . CAD (coronary artery disease), autologous vein bypass graft 10/11/2011    Priority: High  . Diabetes type 2, uncontrolled ([place]) 04/13/2009    Priority: High  . HTN (hypertension)     Priority: Medium  . Hyperlipidemia LDL goal <70 05/13/2008    Priority: Medium  . Alcohol  abuse 08/10/2010  . History of colonic polyps 03/12/2007  . DEPRESSION 12/03/2006    Past Medical History:  Diagnosis Date  . Alcohol abuse, unspecified   . CAD (coronary artery disease) 9/06   CABG x4  . Colon polyps   . Depression   . DMII (diabetes mellitus, type 2) ([place]) 10/03  . HLD (hyperlipidemia) 10/03  . HTN (hypertension) 10/03    Past Surgical History:  Procedure Laterality Date  . COLONOSCOPY    . CORONARY ARTERY BYPASS GRAFT  9/06   x4  . ESOPHAGOGASTRODUODENOSCOPY  7/07   polyp; mucosal abnml  . TONSILLECTOMY      Social History   Socioeconomic History  . Marital status: Married    Spouse name: Not on file  . Number of children: Not on file  . Years of education: Not on file  . Highest education level: Not on file  Social Needs  . Financial resource strain: Not on file  . Food insecurity - worry: Not on file  . Food insecurity - inability: Not on file  . Transportation needs - medical: Not on file  . Transportation needs - non-medical: Not on file  Occupational History  . Not on file  Tobacco Use  . Smoking status: Former Research scientist (life sciences)  . Smokeless tobacco: Never Used  . Tobacco comment: quit 41 years ago  Substance and Sexual Activity  . Alcohol use: Yes    Alcohol/week: 9.0 oz  Types: 15 Shots of liquor per week    Comment: Regular  . Drug use: No  . Sexual activity: Not on file  Other Topics Concern  . Not on file  Social History Narrative   Cares for his wife who is disabled with dementia          Family History  Problem Relation Age of Onset  . Alcohol abuse Father   . Stroke Father   . Other Father        cardiac problems  . Heart disease Father   . COPD Mother   . Alcohol abuse Brother   . Cirrhosis Brother        EtOH  . Anxiety disorder Brother   . Heart disease Paternal Grandmother   . Colon cancer Neg Hx   . Esophageal cancer Neg Hx   . Rectal cancer Neg Hx   . Stomach cancer Neg Hx     Allergies  Allergen Reactions   . Sulfonamide Derivatives     REACTION: unknown reaction    Medication list reviewed and updated in full in [place].   GEN: No acute illnesses, no fevers, chills. GI: No n/v/d, eating normally Pulm: No SOB Interactive and getting along well at home.  Otherwise, ROS is as per the HPI.  Objective:   BP 140/80   Pulse (!) 55   Temp 97.8 F (36.6 C) (Oral)   Ht 5' 6"  (1.676 m)   Wt 170 lb 4 oz (77.2 kg)   BMI 27.48 kg/m   GEN: WDWN, NAD, Non-toxic, A & O x 3 HEENT: Atraumatic, Normocephalic. Neck supple. No masses, No LAD. Ears and Nose: No external deformity. CV: RRR, No M/G/R. No JVD. No thrill. No extra heart sounds. PULM: CTA B, no wheezes, crackles, rhonchi. No retractions. No resp. distress. No accessory muscle use. EXTR: No c/c/e NEURO Normal gait.  PSYCH: Normally interactive. Conversant. Not depressed or anxious appearing.  Calm demeanor.   Laboratory and Imaging Data: Results for orders placed or performed in visit on 09/13/17  Hemoglobin A1c  Result Value Ref Range   Hgb A1c MFr Bld 6.1 4.6 - 6.5 %  Basic metabolic panel  Result Value Ref Range   Sodium 139 135 - 145 mEq/L   Potassium 4.5 3.5 - 5.1 mEq/L   Chloride 102 96 - 112 mEq/L   CO2 29 19 - 32 mEq/L   Glucose, Bld 134 (H) 70 - 99 mg/dL   BUN 16 6 - 23 mg/dL   Creatinine, Ser 1.14 0.40 - 1.50 mg/dL   Calcium 9.7 8.4 - 10.5 mg/dL   GFR 67.24 >60.00 mL/min  Lipid panel  Result Value Ref Range   Cholesterol 101 0 - 200 mg/dL   Triglycerides 124.0 0.0 - 149.0 mg/dL   HDL 31.10 (L) >39.00 mg/dL   VLDL 24.8 0.0 - 40.0 mg/dL   LDL Cholesterol 46 0 - 99 mg/dL   Total CHOL/HDL Ratio 3    NonHDL 70.30      Assessment and Plan:   Controlled type 2 diabetes mellitus with complication, without long-term current use of insulin (HCC)  Erectile dysfunction due to arterial insufficiency   >25 minutes spent in face to face time with patient, >50% spent in counselling or coordination of care    [name] is doing so great. DM controlled now, cut way back on his drinking.   Additional time spent discussing, ED, risk factors including CAD, PVD, DM, HTN, his personal risk and his  own situation.    Follow-up: Return in about 5 months (around 02/14/2018).  Meds ordered this encounter  Medications  . sildenafil (REVATIO) 20 MG tablet    Sig: Generic Revatio / Sildanefil 20 mg. 2 - 5 tabs 30 mins prior to intercourse.    Dispense:  50 tablet    Refill:  11   Signed,  Mayah Urquidi T. Alick Lecomte, MD   Allergies as of 09/16/2017      Reactions   Sulfonamide Derivatives    REACTION: unknown reaction      Medication List        Accurate as of 09/16/17  1:30 PM. Always use your most recent med list.          ACCU-CHEK AVIVA PLUS w/Device Kit Check blood sugar once daily and as instructed. Dx E11.65   ACCU-CHEK SOFTCLIX LANCETS lancets Check blood sugar once daily and as instructed. Dx E11.65   amLODipine 10 MG tablet Commonly known as:  NORVASC Take 1 tablet (10 mg total) by mouth daily.   aspirin 81 MG tablet Take 1 tablet (81 mg total) by mouth daily.   atorvastatin 80 MG tablet Commonly known as:  LIPITOR Take 1 tablet (80 mg total) by mouth daily.   calcium carbonate 500 MG chewable tablet Commonly known as:  TUMS - dosed in mg elemental calcium Chew 1 tablet by mouth daily as needed for indigestion or heartburn.   fish oil-omega-3 fatty acids 1000 MG capsule Take 2 g by mouth daily.   glucose blood test strip Commonly known as:  ACCU-CHEK AVIVA PLUS Check blood sugar once daily and as instructed. Dx E11.65   losartan 50 MG tablet Commonly known as:  COZAAR Take 1 tablet (50 mg total) by mouth daily.   metFORMIN 500 MG 24 hr tablet Commonly known as:  GLUCOPHAGE-XR Take 4 tablets (2,000 mg total) by mouth daily with breakfast.   metoprolol tartrate 50 MG tablet Commonly known as:  LOPRESSOR Take 1 tablet (50 mg total) by mouth 2 (two) times daily.    multivitamin tablet Take 1 tablet by mouth daily.   sildenafil 20 MG tablet Commonly known as:  REVATIO Generic Revatio / Sildanefil 20 mg. 2 - 5 tabs 30 mins prior to intercourse.

## 2017-12-05 ENCOUNTER — Other Ambulatory Visit: Payer: Self-pay | Admitting: Family Medicine

## 2018-02-10 ENCOUNTER — Other Ambulatory Visit (INDEPENDENT_AMBULATORY_CARE_PROVIDER_SITE_OTHER): Payer: Medicare HMO

## 2018-02-10 ENCOUNTER — Other Ambulatory Visit: Payer: Self-pay | Admitting: Family Medicine

## 2018-02-10 DIAGNOSIS — Z125 Encounter for screening for malignant neoplasm of prostate: Secondary | ICD-10-CM

## 2018-02-10 DIAGNOSIS — Z79899 Other long term (current) drug therapy: Secondary | ICD-10-CM

## 2018-02-10 DIAGNOSIS — Z0000 Encounter for general adult medical examination without abnormal findings: Secondary | ICD-10-CM | POA: Diagnosis not present

## 2018-02-10 DIAGNOSIS — E785 Hyperlipidemia, unspecified: Secondary | ICD-10-CM

## 2018-02-10 DIAGNOSIS — E119 Type 2 diabetes mellitus without complications: Secondary | ICD-10-CM | POA: Diagnosis not present

## 2018-02-10 LAB — CBC WITH DIFFERENTIAL/PLATELET
BASOS PCT: 1.2 % (ref 0.0–3.0)
Basophils Absolute: 0.1 10*3/uL (ref 0.0–0.1)
EOS ABS: 0.2 10*3/uL (ref 0.0–0.7)
EOS PCT: 4.8 % (ref 0.0–5.0)
HEMATOCRIT: 42.6 % (ref 39.0–52.0)
HEMOGLOBIN: 15.2 g/dL (ref 13.0–17.0)
LYMPHS PCT: 32.3 % (ref 12.0–46.0)
Lymphs Abs: 1.5 10*3/uL (ref 0.7–4.0)
MCHC: 35.6 g/dL (ref 30.0–36.0)
MCV: 92 fl (ref 78.0–100.0)
MONO ABS: 0.4 10*3/uL (ref 0.1–1.0)
Monocytes Relative: 9.7 % (ref 3.0–12.0)
NEUTROS ABS: 2.4 10*3/uL (ref 1.4–7.7)
Neutrophils Relative %: 52 % (ref 43.0–77.0)
PLATELETS: 156 10*3/uL (ref 150.0–400.0)
RBC: 4.64 Mil/uL (ref 4.22–5.81)
RDW: 12.7 % (ref 11.5–15.5)
WBC: 4.5 10*3/uL (ref 4.0–10.5)

## 2018-02-10 LAB — MICROALBUMIN / CREATININE URINE RATIO
Creatinine,U: 71 mg/dL
MICROALB UR: 0.9 mg/dL (ref 0.0–1.9)
Microalb Creat Ratio: 1.2 mg/g (ref 0.0–30.0)

## 2018-02-10 LAB — LIPID PANEL
Cholesterol: 124 mg/dL (ref 0–200)
HDL: 34.9 mg/dL — AB (ref >39.00)
NONHDL: 88.83
Total CHOL/HDL Ratio: 4
Triglycerides: 216 mg/dL — ABNORMAL HIGH (ref 0.0–149.0)
VLDL: 43.2 mg/dL — ABNORMAL HIGH (ref 0.0–40.0)

## 2018-02-10 LAB — HEPATIC FUNCTION PANEL
ALT: 23 U/L (ref 0–53)
AST: 21 U/L (ref 0–37)
Albumin: 4.6 g/dL (ref 3.5–5.2)
Alkaline Phosphatase: 38 U/L — ABNORMAL LOW (ref 39–117)
BILIRUBIN DIRECT: 0.2 mg/dL (ref 0.0–0.3)
BILIRUBIN TOTAL: 0.9 mg/dL (ref 0.2–1.2)
Total Protein: 7.1 g/dL (ref 6.0–8.3)

## 2018-02-10 LAB — BASIC METABOLIC PANEL
BUN: 14 mg/dL (ref 6–23)
CHLORIDE: 102 meq/L (ref 96–112)
CO2: 28 mEq/L (ref 19–32)
Calcium: 9.7 mg/dL (ref 8.4–10.5)
Creatinine, Ser: 1.17 mg/dL (ref 0.40–1.50)
GFR: 65.18 mL/min (ref >60.00)
Glucose, Bld: 147 mg/dL — ABNORMAL HIGH (ref 70–99)
POTASSIUM: 4.2 meq/L (ref 3.5–5.1)
Sodium: 141 mEq/L (ref 135–145)

## 2018-02-10 LAB — HEMOGLOBIN A1C: Hgb A1c MFr Bld: 6.3 % (ref 4.6–6.5)

## 2018-02-10 LAB — LDL CHOLESTEROL, DIRECT: LDL DIRECT: 61 mg/dL

## 2018-02-10 LAB — PSA: PSA: 1.29 ng/mL (ref 0.10–4.00)

## 2018-02-10 NOTE — Addendum Note (Signed)
Addended by: Lendon Collar on: 02/10/2018 11:37 AM   Modules accepted: Orders

## 2018-02-12 ENCOUNTER — Encounter: Payer: Self-pay | Admitting: Family Medicine

## 2018-02-12 ENCOUNTER — Ambulatory Visit (INDEPENDENT_AMBULATORY_CARE_PROVIDER_SITE_OTHER): Payer: Medicare HMO | Admitting: Family Medicine

## 2018-02-12 VITALS — BP 130/64 | HR 67 | Temp 98.6°F | Ht 66.0 in | Wt 170.0 lb

## 2018-02-12 DIAGNOSIS — E118 Type 2 diabetes mellitus with unspecified complications: Secondary | ICD-10-CM

## 2018-02-12 NOTE — Progress Notes (Addendum)
Dr. Frederico Hamman T. Tamiah Dysart, MD, [facility] Primary [facility] and Sports Medicine [place], [address] Phone: [phone] Fax: [phone]  02/12/2018  Patient: [name], MRN: [id number], DOB: 05-31-1946, 72 y.o.  Primary Physician:  [name], MD   Chief Complaint  Patient presents with  . Diabetes   Subjective:   [name] is a 72 y.o. very pleasant male patient who presents with the following:  Lab Results  Component Value Date   HGBA1C 6.3 02/10/2018    Change lab diagnosis to DM labs and f/u  Diabetes Mellitus: Tolerating Medications: yes Compliance with diet: fair Exercise: minimal / intermittent Avg blood sugars at home: not checking Foot problems: none Hypoglycemia: none No nausea, vomitting, blurred vision, polyuria.  Lab Results  Component Value Date   HGBA1C 6.3 02/10/2018   HGBA1C 6.1 09/13/2017   HGBA1C 9.3 (H) 06/13/2017   Lab Results  Component Value Date   MICROALBUR 0.9 02/10/2018   LDLCALC 46 09/13/2017   CREATININE 1.17 02/10/2018    Wt Readings from Last 3 Encounters:  02/12/18 170 lb (77.1 kg)  09/16/17 170 lb 4 oz (77.2 kg)  06/13/17 174 lb 12 oz (79.3 kg)    Body mass index is 27.44 kg/m.   Past Medical History, Surgical History, Social History, Family History, Problem List, Medications, and Allergies have been reviewed and updated if relevant.  Patient Active Problem List   Diagnosis Date Noted  . CAD (coronary artery disease), autologous vein bypass graft 10/11/2011    Priority: High  . Diabetes type 2, uncontrolled ([place]) 04/13/2009    Priority: High  . HTN (hypertension)     Priority: Medium  . Hyperlipidemia LDL goal <70 05/13/2008    Priority: Medium  . Alcohol abuse 08/10/2010  . History of colonic polyps 03/12/2007  . DEPRESSION 12/03/2006    Past Medical History:  Diagnosis Date  . Alcohol abuse, unspecified   . CAD (coronary artery disease) 9/06   CABG x4  . Colon polyps    . Depression   . DMII (diabetes mellitus, type 2) ([place]) 10/03  . HLD (hyperlipidemia) 10/03  . HTN (hypertension) 10/03    Past Surgical History:  Procedure Laterality Date  . COLONOSCOPY    . CORONARY ARTERY BYPASS GRAFT  9/06   x4  . ESOPHAGOGASTRODUODENOSCOPY  7/07   polyp; mucosal abnml  . TONSILLECTOMY      Social History   Socioeconomic History  . Marital status: Married    Spouse name: Not on file  . Number of children: Not on file  . Years of education: Not on file  . Highest education level: Not on file  Occupational History  . Not on file  Social Needs  . Financial resource strain: Not on file  . Food insecurity:    Worry: Not on file    Inability: Not on file  . Transportation needs:    Medical: Not on file    Non-medical: Not on file  Tobacco Use  . Smoking status: Former Research scientist (life sciences)  . Smokeless tobacco: Never Used  . Tobacco comment: quit 41 years ago  Substance and Sexual Activity  . Alcohol use: Yes    Alcohol/week: 9.0 oz    Types: 15 Shots of liquor per week    Comment: Regular  . Drug use: No  . Sexual activity: Not on file  Lifestyle  . Physical activity:    Days per week: Not on file  Minutes per session: Not on file  . Stress: Not on file  Relationships  . Social connections:    Talks on phone: Not on file    Gets together: Not on file    Attends religious service: Not on file    Active member of club or organization: Not on file    Attends meetings of clubs or organizations: Not on file    Relationship status: Not on file  . Intimate partner violence:    Fear of current or ex partner: Not on file    Emotionally abused: Not on file    Physically abused: Not on file    Forced sexual activity: Not on file  Other Topics Concern  . Not on file  Social History Narrative   Wife Eustaquio Maize is deceased   He is retired.    Enjoys firearms and recreational shooting.     Family History  Problem Relation Age of Onset  . Alcohol abuse Father    . Stroke Father   . Other Father        cardiac problems  . Heart disease Father   . COPD Mother   . Alcohol abuse Brother   . Cirrhosis Brother        EtOH  . Anxiety disorder Brother   . Heart disease Paternal Grandmother   . Colon cancer Neg Hx   . Esophageal cancer Neg Hx   . Rectal cancer Neg Hx   . Stomach cancer Neg Hx     Allergies  Allergen Reactions  . Sulfonamide Derivatives     REACTION: unknown reaction    Medication list reviewed and updated in full in [place].   GEN: No acute illnesses, no fevers, chills. GI: No n/v/d, eating normally Pulm: No SOB Interactive and getting along well at home.  Otherwise, ROS is as per the HPI.  Objective:   BP 130/64   Pulse 67   Temp 98.6 F (37 C) (Oral)   Ht _0  (1.676 m)   Wt 170 lb (77.1 kg)   BMI 27.44 kg/m   GEN: WDWN, NAD, Non-toxic, A & O x 3 HEENT: Atraumatic, Normocephalic. Neck supple. No masses, No LAD. Ears and Nose: No external deformity. CV: RRR, No M/G/R. No JVD. No thrill. No extra heart sounds. PULM: CTA B, no wheezes, crackles, rhonchi. No retractions. No resp. distress. No accessory muscle use. EXTR: No c/c/e NEURO Normal gait.  PSYCH: Normally interactive. Conversant. Not depressed or anxious appearing.  Calm demeanor.   Laboratory and Imaging Data: Results for orders placed or performed in visit on 02/10/18  PSA  Result Value Ref Range   PSA 1.29 0.10 - 4.00 ng/mL  Basic metabolic panel  Result Value Ref Range   Sodium 141 135 - 145 mEq/L   Potassium 4.2 3.5 - 5.1 mEq/L   Chloride 102 96 - 112 mEq/L   CO2 28 19 - 32 mEq/L   Glucose, Bld 147 (H) 70 - 99 mg/dL   BUN 14 6 - 23 mg/dL   Creatinine, Ser 1.17 0.40 - 1.50 mg/dL   Calcium 9.7 8.4 - 10.5 mg/dL   GFR 65.18 >60.00 mL/min  Hepatic function panel  Result Value Ref Range   Total Bilirubin 0.9 0.2 - 1.2 mg/dL   Bilirubin, Direct 0.2 0.0 - 0.3 mg/dL   Alkaline Phosphatase 38 (L) 39 - 117 U/L   AST 21 0 - 37 U/L     ALT 23 0 - 53 U/L  Total Protein 7.1 6.0 - 8.3 g/dL   Albumin 4.6 3.5 - 5.2 g/dL  CBC with Differential/Platelet  Result Value Ref Range   WBC 4.5 4.0 - 10.5 K/uL   RBC 4.64 4.22 - 5.81 Mil/uL   Hemoglobin 15.2 13.0 - 17.0 g/dL   HCT 42.6 39.0 - 52.0 %   MCV 92.0 78.0 - 100.0 fl   MCHC 35.6 30.0 - 36.0 g/dL   RDW 12.7 11.5 - 15.5 %   Platelets 156.0 150.0 - 400.0 K/uL   Neutrophils Relative % 52.0 43.0 - 77.0 %   Lymphocytes Relative 32.3 12.0 - 46.0 %   Monocytes Relative 9.7 3.0 - 12.0 %   Eosinophils Relative 4.8 0.0 - 5.0 %   Basophils Relative 1.2 0.0 - 3.0 %   Neutro Abs 2.4 1.4 - 7.7 K/uL   Lymphs Abs 1.5 0.7 - 4.0 K/uL   Monocytes Absolute 0.4 0.1 - 1.0 K/uL   Eosinophils Absolute 0.2 0.0 - 0.7 K/uL   Basophils Absolute 0.1 0.0 - 0.1 K/uL  Hemoglobin A1c  Result Value Ref Range   Hgb A1c MFr Bld 6.3 4.6 - 6.5 %  Lipid panel  Result Value Ref Range   Cholesterol 124 0 - 200 mg/dL   Triglycerides 216.0 (H) 0.0 - 149.0 mg/dL   HDL 34.90 (L) >39.00 mg/dL   VLDL 43.2 (H) 0.0 - 40.0 mg/dL   Total CHOL/HDL Ratio 4    NonHDL 88.83   Microalbumin / creatinine urine ratio  Result Value Ref Range   Microalb, Ur 0.9 0.0 - 1.9 mg/dL   Creatinine,U 71.0 mg/dL   Microalb Creat Ratio 1.2 0.0 - 30.0 mg/g  LDL cholesterol, direct  Result Value Ref Range   Direct LDL 61.0 mg/dL     Assessment and Plan:   Controlled type 2 diabetes mellitus with complication, without long-term current use of insulin ([place])  Doing well  Addendum: 03/09/18 2:44 PM   The patient had multiple labs drawn under a z00.00 code under his last office visit of 02/10/2018 blood draw  Follow-up: Return in about 6 months (around 08/14/2018) for Complete physical.  Microalbumin / creatine ratio: E11.9, diabetes mellitus type 2 Lipid panel with direct ldl: e78.5, coronary artery disease and hyperlipidemia Hemoglobin a1c: E11.9 CBC with differential: z79.899 Hepatic function panel: z79.899 BMP:  E11.9 PSA: BPH, medicare PSA  All of the labs are listed above with the appropriate diagnosis codes listed for each test - I initially ordered with a CPX code in error.  Electronically Signed  By: [name], MD On: 03/09/2018 2:50 PM   Follow-up: Return in about 6 months (around 08/14/2018) for Complete physical.  Signed,  Frederico Hamman T. Sunaina Ferrando, MD   Allergies as of 02/12/2018      Reactions   Sulfonamide Derivatives    REACTION: unknown reaction      Medication List        Accurate as of 02/12/18 11:59 PM. Always use your most recent med list.          ACCU-CHEK AVIVA PLUS w/Device Kit Check blood sugar once daily and as instructed. Dx E11.65   ACCU-CHEK SOFTCLIX LANCETS lancets Check blood sugar once daily and as instructed. Dx E11.65   amLODipine 10 MG tablet Commonly known as:  NORVASC Take 1 tablet (10 mg total) by mouth daily.   aspirin 81 MG tablet Take 1 tablet (81 mg total) by mouth daily.   atorvastatin 80 MG tablet Commonly known as:  LIPITOR TAKE 1  TABLET BY MOUTH ONCE DAILY   calcium carbonate 500 MG chewable tablet Commonly known as:  TUMS - dosed in mg elemental calcium Chew 1 tablet by mouth daily as needed for indigestion or heartburn.   fish oil-omega-3 fatty acids 1000 MG capsule Take 2 g by mouth daily.   glucose blood test strip Commonly known as:  ACCU-CHEK AVIVA PLUS Check blood sugar once daily and as instructed. Dx E11.65   losartan 50 MG tablet Commonly known as:  COZAAR TAKE 1 TABLET BY MOUTH EVERY DAY   metFORMIN 500 MG 24 hr tablet Commonly known as:  GLUCOPHAGE-XR Take 4 tablets (2,000 mg total) by mouth daily with breakfast.   metoprolol tartrate 50 MG tablet Commonly known as:  LOPRESSOR Take 1 tablet (50 mg total) by mouth 2 (two) times daily.   multivitamin tablet Take 1 tablet by mouth daily.   sildenafil 20 MG tablet Commonly known as:  REVATIO Generic Revatio / Sildanefil 20 mg. 2 - 5 tabs 30 mins prior to  intercourse.

## 2018-05-25 DIAGNOSIS — I2510 Atherosclerotic heart disease of native coronary artery without angina pectoris: Secondary | ICD-10-CM | POA: Insufficient documentation

## 2018-05-25 NOTE — Progress Notes (Signed)
Cardiology Office Note  Date:  05/26/2018   ID:  [name], DOB September 27, 1945, MRN [id number]  PCP:  [name], MD   Chief Complaint  Patient presents with  . other    12 month follow up. Meds reviewed by the pt. verbally. "doing well."    HPI:   Mr. Cush is a very pleasant 72 yo gentleman with remote history of  coronary artery disease, bypass surgery in 2006 x5 vessel after he developed anginal symptoms with pain radiating to his jaw with exertion,  hyperlipidemia,  diabetes,  hypertension,  Continues to drink some alcohol daily who presents for routine followup of his coronary artery disease  HBA1C 9 down to 6.3 Changed his diet, stopped drinking  Nowing drinking again 3 drinks every night, some shots, wine, mixers Has been drinking for decades, recently heavy, not as much anymore  No regular exercise program No significant chest pain or shortness of breath concerning for ischemia  He is taking Lipitor 80 alternating with 40 mg daily  Lab work reviewed with him, total cholesterol 129 LDL 61 He does eat out  EKG personally reviewed by myself on todays visit  showing sinus bradycardia, rate 56 bpm, no significant ST or T-wave changes  Other past medical history  labs dated 05/18/2014 showing total cholesterol 126, LDL 62, hemoglobin A1c 7.0 He reports that he is alternating Lipitor 80 mg with 40 mg   He has not had a cardiac catheterization since his bypass surgery   PMH:   has a past medical history of Alcohol abuse, unspecified, CAD (coronary artery disease) (9/06), Colon polyps, Depression, DMII (diabetes mellitus, type 2) ([employer]) (10/03), HLD (hyperlipidemia) (10/03), and HTN (hypertension) (10/03).  PSH:    Past Surgical History:  Procedure Laterality Date  . COLONOSCOPY    . CORONARY ARTERY BYPASS GRAFT  9/06   x4  . ESOPHAGOGASTRODUODENOSCOPY  7/07   polyp; mucosal abnml  . TONSILLECTOMY      Current Outpatient Medications  Medication Sig  Dispense Refill  . ACCU-CHEK SOFTCLIX LANCETS lancets Check blood sugar once daily and as instructed. Dx E11.65 100 each 3  . amLODipine (NORVASC) 10 MG tablet Take 1 tablet (10 mg total) by mouth daily. 90 tablet 3  . aspirin 81 MG tablet Take 1 tablet (81 mg total) by mouth daily.    Marland Kitchen atorvastatin (LIPITOR) 80 MG tablet TAKE 1 TABLET BY MOUTH ONCE DAILY 90 tablet 2  . Blood Glucose Monitoring Suppl (ACCU-CHEK AVIVA PLUS) w/Device KIT Check blood sugar once daily and as instructed. Dx E11.65 1 kit 0  . calcium carbonate (TUMS - DOSED IN MG ELEMENTAL CALCIUM) 500 MG chewable tablet Chew 1 tablet by mouth daily as needed for indigestion or heartburn.    . fish oil-omega-3 fatty acids 1000 MG capsule Take 2 g by mouth daily.      Marland Kitchen glucose blood (ACCU-CHEK AVIVA PLUS) test strip Check blood sugar once daily and as instructed. Dx E11.65 100 each 3  . losartan (COZAAR) 50 MG tablet TAKE 1 TABLET BY MOUTH EVERY DAY 90 tablet 1  . metFORMIN (GLUCOPHAGE-XR) 500 MG 24 hr tablet Take 4 tablets (2,000 mg total) by mouth daily with breakfast. 360 tablet 3  . metoprolol tartrate (LOPRESSOR) 50 MG tablet Take 1 tablet (50 mg total) by mouth 2 (two) times daily. 180 tablet 3  . Multiple Vitamin (MULTIVITAMIN) tablet Take 1 tablet by mouth daily.      . sildenafil (REVATIO) 20 MG tablet Generic  Revatio / Sildanefil 20 mg. 2 - 5 tabs 30 mins prior to intercourse. 50 tablet 11   No current facility-administered medications for this visit.      Allergies:   Sulfonamide derivatives   Social History:  The patient  reports that he has quit smoking. He has never used smokeless tobacco. He reports that he drinks about 15.0 standard drinks of alcohol per week. He reports that he does not use drugs.   Family History:   family history includes Alcohol abuse in his brother and father; Anxiety disorder in his brother; COPD in his mother; Cirrhosis in his brother; Heart disease in his father and paternal grandmother;  Other in his father; Stroke in his father.    Review of Systems: Review of Systems  Constitutional: Negative.   Respiratory: Negative.   Cardiovascular: Negative.   Gastrointestinal: Negative.   Musculoskeletal: Positive for joint pain.  Neurological: Negative.   Psychiatric/Behavioral: Negative.   All other systems reviewed and are negative.    PHYSICAL EXAM: VS:  BP (!) 144/72 (BP Location: Left Arm, Patient Position: Sitting, Cuff Size: Normal)   Pulse (!) 56   Ht _0  (1.702 m)   Wt 173 lb 8 oz (78.7 kg)   BMI 27.17 kg/m  , BMI Body mass index is 27.17 kg/m.  GEN: Well nourished, well developed, in no acute distress , good mood HEENT: normal  Neck: no JVD, carotid bruits, or masses Cardiac: RRR; 2/6 murmur,no  rubs, or gallops,no edema  Respiratory:  clear to auscultation bilaterally, normal work of breathing GI: soft, nontender, nondistended, + BS MS: no deformity or atrophy  Skin: warm and dry, no rash Neuro: Grossly intact Psych: euthymic mood, full affect  Recent Labs: 02/10/2018: ALT 23; BUN 14; Creatinine, Ser 1.17; Hemoglobin 15.2; Platelets 156.0; Potassium 4.2; Sodium 141    Lipid Panel Lab Results  Component Value Date   CHOL 124 02/10/2018   HDL 34.90 (L) 02/10/2018   LDLCALC 46 09/13/2017   TRIG 216.0 (H) 02/10/2018      Wt Readings from Last 3 Encounters:  05/26/18 173 lb 8 oz (78.7 kg)  02/12/18 170 lb (77.1 kg)  09/16/17 170 lb 4 oz (77.2 kg)     ASSESSMENT AND PLAN:   Coronary artery disease involving autologous vein coronary bypass graft without angina pectoris - Plan: EKG 12-Lead 13 years since bypass surgery, Stressed the importance of aggressive diabetes control Non-smoker, cholesterol at goal No further testing at this time  Essential hypertension - Plan: EKG 12-Lead Blood pressure borderline elevated, recommended he monitor this at home and call us if numbers run high  Hyperlipidemia LDL goal <70 Cholesterol is at goal on  the current lipid regimen. No changes to the medications were made.  Uncontrolled type 2 diabetes mellitus with other circulatory complication, without long-term current use of insulin (HCC) Discussed his dietary habits, eating out most meals Recommended a walking program  Alcohol abuse Suggested moderating his alcohol intake Hemoglobin A1c numbers may go up when he drinks more Discussed the side effects of alcohol A1c numbers did much better without alcohol  GERDX Denies any significant GERD symptoms    Total encounter time more than 25 minutes  Greater than 50% was spent in counseling and coordination of care with the patient   Disposition:   F/U  12 months   Orders Placed This Encounter  Procedures  . EKG 12-Lead     Signed, Esmond Plants, M.D., Ph.D. 05/26/2018  [facility]  [place], [place]

## 2018-05-26 ENCOUNTER — Ambulatory Visit: Payer: Medicare HMO | Admitting: Cardiovascular Disease

## 2018-05-26 ENCOUNTER — Encounter: Payer: Self-pay | Admitting: Cardiovascular Disease

## 2018-05-26 VITALS — BP 144/72 | HR 56 | Ht 67.0 in | Wt 173.5 lb

## 2018-05-26 DIAGNOSIS — E785 Hyperlipidemia, unspecified: Secondary | ICD-10-CM | POA: Diagnosis not present

## 2018-05-26 DIAGNOSIS — E1165 Type 2 diabetes mellitus with hyperglycemia: Secondary | ICD-10-CM

## 2018-05-26 DIAGNOSIS — I10 Essential (primary) hypertension: Secondary | ICD-10-CM

## 2018-05-26 DIAGNOSIS — F1010 Alcohol abuse, uncomplicated: Secondary | ICD-10-CM | POA: Diagnosis not present

## 2018-05-26 DIAGNOSIS — I25118 Atherosclerotic heart disease of native coronary artery with other forms of angina pectoris: Secondary | ICD-10-CM | POA: Diagnosis not present

## 2018-05-26 NOTE — Patient Instructions (Signed)

## 2018-05-29 ENCOUNTER — Ambulatory Visit: Payer: Medicare HMO | Admitting: Cardiovascular Disease

## 2018-06-03 ENCOUNTER — Other Ambulatory Visit: Payer: Self-pay | Admitting: Family Medicine

## 2018-06-06 ENCOUNTER — Other Ambulatory Visit: Payer: Self-pay | Admitting: Family Medicine

## 2018-06-10 ENCOUNTER — Ambulatory Visit: Payer: Medicare HMO | Admitting: Cardiovascular Disease

## 2018-06-11 ENCOUNTER — Ambulatory Visit: Payer: Medicare HMO | Admitting: Cardiovascular Disease

## 2018-06-16 ENCOUNTER — Ambulatory Visit: Payer: Medicare HMO

## 2018-06-23 ENCOUNTER — Encounter: Payer: Medicare HMO | Admitting: Family Medicine

## 2018-07-21 ENCOUNTER — Telehealth: Payer: Self-pay | Admitting: Family Medicine

## 2018-07-21 ENCOUNTER — Ambulatory Visit (INDEPENDENT_AMBULATORY_CARE_PROVIDER_SITE_OTHER): Payer: Medicare HMO

## 2018-07-21 VITALS — BP 140/84 | HR 67 | Temp 98.4°F | Ht 67.0 in | Wt 179.5 lb

## 2018-07-21 DIAGNOSIS — I10 Essential (primary) hypertension: Secondary | ICD-10-CM

## 2018-07-21 DIAGNOSIS — Z0000 Encounter for general adult medical examination without abnormal findings: Secondary | ICD-10-CM | POA: Diagnosis not present

## 2018-07-21 DIAGNOSIS — E1165 Type 2 diabetes mellitus with hyperglycemia: Secondary | ICD-10-CM

## 2018-07-21 LAB — COMPREHENSIVE METABOLIC PANEL
ALBUMIN: 4.9 g/dL (ref 3.5–5.2)
ALK PHOS: 45 U/L (ref 39–117)
ALT: 29 U/L (ref 0–53)
AST: 19 U/L (ref 0–37)
BUN: 20 mg/dL (ref 6–23)
CALCIUM: 10.6 mg/dL — AB (ref 8.4–10.5)
CHLORIDE: 102 meq/L (ref 96–112)
CO2: 29 mEq/L (ref 19–32)
CREATININE: 1.24 mg/dL (ref 0.40–1.50)
GFR: 60.88 mL/min (ref >60.00)
Glucose, Bld: 151 mg/dL — ABNORMAL HIGH (ref 70–99)
POTASSIUM: 4.7 meq/L (ref 3.5–5.1)
Sodium: 140 mEq/L (ref 135–145)
Total Bilirubin: 0.8 mg/dL (ref 0.2–1.2)
Total Protein: 7.9 g/dL (ref 6.0–8.3)

## 2018-07-21 LAB — HEMOGLOBIN A1C: Hgb A1c MFr Bld: 6.9 % — ABNORMAL HIGH (ref 4.6–6.5)

## 2018-07-21 LAB — LIPID PANEL
CHOLESTEROL: 137 mg/dL (ref 0–200)
HDL: 37 mg/dL — ABNORMAL LOW (ref >39.00)
LDL CALC: 62 mg/dL (ref 0–99)
NonHDL: 100.09
TRIGLYCERIDES: 188 mg/dL — AB (ref 0.0–149.0)
Total CHOL/HDL Ratio: 4
VLDL: 37.6 mg/dL (ref 0.0–40.0)

## 2018-07-21 NOTE — Telephone Encounter (Signed)
-----   Message from Eustace Pen, LPN sent at [phone]/65/4650  9:01 AM EST ----- Regarding: Labs 12/2 Lab orders needed. Thank you.  Insurance:  [employer]  FYI: Dr. Lillie Fragmin patient.

## 2018-07-21 NOTE — Patient Instructions (Signed)
[name] , Thank you for taking time to come for your Medicare Wellness Visit. I appreciate your ongoing commitment to your health goals. Please review the following plan we discussed and let me know if I can assist you in the future.   These are the goals we discussed: Goals    . Patient Stated     Starting 07/21/2018, I will continue to take medications as prescribed.        This is a list of the screening recommended for you and due dates:  Health Maintenance  Topic Date Due  . Flu Shot  07/30/2018*  . Complete foot exam   07/30/2018*  . Eye exam for diabetics  06/21/2019*  . Colon Cancer Screening  08/20/2019*  . Hemoglobin A1C  01/20/2019  . Tetanus Vaccine  04/13/2019  .  Hepatitis C: One time screening is recommended by Center for Disease Control  (CDC) for  adults born from [birth year] through 1965.   Completed  . Pneumonia vaccines  Completed  *Topic was postponed. The date shown is not the original due date.   Preventive Care for Adults  A healthy lifestyle and preventive care can promote health and wellness. Preventive health guidelines for adults include the following key practices.  . A routine yearly physical is a good way to check with your health care provider about your health and preventive screening. It is a chance to share any concerns and updates on your health and to receive a thorough exam.  . Visit your dentist for a routine exam and preventive care every 6 months. Brush your teeth twice a day and floss once a day. Good oral hygiene prevents tooth decay and gum disease.  . The frequency of eye exams is based on your age, health, family medical history, use  of contact lenses, and other factors. Follow your health care provider's recommendations for frequency of eye exams.  . Eat a healthy diet. Foods like vegetables, fruits, whole grains, low-fat dairy products, and lean protein foods contain the nutrients you need without too many calories. Decrease your intake  of foods high in solid fats, added sugars, and salt. Eat the right amount of calories for you. Get information about a proper diet from your health care provider, if necessary.  . Regular physical exercise is one of the most important things you can do for your health. Most adults should get at least 150 minutes of moderate-intensity exercise (any activity that increases your heart rate and causes you to sweat) each week. In addition, most adults need muscle-strengthening exercises on 2 or more days a week.  Silver Sneakers may be a benefit available to you. To determine eligibility, you may visit the website: www.silversneakers.com or contact program at 339-302-7979 Mon-Fri between 8AM-8PM.   . Maintain a healthy weight. The body mass index (BMI) is a screening tool to identify possible weight problems. It provides an estimate of body fat based on height and weight. Your health care provider can find your BMI and can help you achieve or maintain a healthy weight.   For adults 20 years and older: ? A BMI below 18.5 is considered underweight. ? A BMI of 18.5 to 24.9 is normal. ? A BMI of 25 to 29.9 is considered overweight. ? A BMI of 30 and above is considered obese.   . Maintain normal blood lipids and cholesterol levels by exercising and minimizing your intake of saturated fat. Eat a balanced diet with plenty of fruit and vegetables. Blood  tests for lipids and cholesterol should begin at age 37 and be repeated every 5 years. If your lipid or cholesterol levels are high, you are over 50, or you are at high risk for heart disease, you may need your cholesterol levels checked more frequently. Ongoing high lipid and cholesterol levels should be treated with medicines if diet and exercise are not working.  . If you smoke, find out from your health care provider how to quit. If you do not use tobacco, please do not start.  . If you choose to drink alcohol, please do not consume more than 2 drinks  per day. One drink is considered to be 12 ounces (355 mL) of beer, 5 ounces (148 mL) of wine, or 1.5 ounces (44 mL) of liquor.  . If you are 75-80 years old, ask your health care provider if you should take aspirin to prevent strokes.  . Use sunscreen. Apply sunscreen liberally and repeatedly throughout the day. You should seek shade when your shadow is shorter than you. Protect yourself by wearing long sleeves, pants, a wide-brimmed hat, and sunglasses year round, whenever you are outdoors.  . Once a month, do a whole body skin exam, using a mirror to look at the skin on your back. Tell your health care provider of new moles, moles that have irregular borders, moles that are larger than a pencil eraser, or moles that have changed in shape or color.

## 2018-07-22 NOTE — Progress Notes (Signed)
PCP notes:   Health maintenance:  Flu vaccine - PCP follow-up requested Foot exam - PCP follow-up requested Eye exam - addressed Colonoscopy - PCP follow-up requested  Abnormal screenings:   Hearing - failed  Hearing Screening   125Hz  250Hz  500Hz  1000Hz  2000Hz  3000Hz  4000Hz  6000Hz  8000Hz   Right ear:   40 40 40  0    Left ear:   40 40 40  40     Mini-Cog score: 18/20 MMSE - Mini Mental State Exam 07/21/2018  Orientation to time 5  Orientation to Place 5  Registration 3  Attention/ Calculation 0  Recall 1  Recall-comments unable to recall 2 of 3 words  Language- name 2 objects 0  Language- repeat 1  Language- follow 3 step command 3  Language- read & follow direction 0  Write a sentence 0  Copy design 0  Total score 18    Patient concerns:   Patient reports left foot has been numb at times. States there were blisters on her toes. Patient is concerned he may possibly have nerve damage.   Nurse concerns:  None  Next PCP appt:   07/30/18 @ 1040

## 2018-07-22 NOTE — Progress Notes (Signed)
Subjective:   [name] is a 72 y.o. male who presents for Medicare Annual (Subsequent) preventive examination.  Review of Systems:  N/A Cardiac Risk Factors include: advanced age (>34mn, >>68women);male gender;diabetes mellitus     Objective:     Vitals: BP 140/84 (BP Location: Right Arm, Patient Position: Sitting, Cuff Size: Normal)   Pulse 67   Temp 98.4 F (36.9 C) (Oral)   Ht _0  (1.702 m) Comment: shoes  Wt 179 lb 8 oz (81.4 kg)   SpO2 97%   BMI 28.11 kg/m   Body mass index is 28.11 kg/m.  Advanced Directives 07/22/2018 07/05/2014  Does Patient Have a Medical Advance Directive? No Yes  Type of Advance Directive [place]  Would patient like information on creating a medical advance directive? No - Patient declined -    Tobacco Social History   Tobacco Use  Smoking Status Former Smoker  Smokeless Tobacco Never Used  Tobacco Comment   quit 41 years ago     Counseling given: No Comment: quit 41 years ago   Clinical Intake:  Pre-visit preparation completed: Yes  Pain : No/denies pain Pain Score: 1      Nutritional Status: BMI 25 -29 Overweight Nutritional Risks: None Diabetes: Yes CBG done?: No Did pt. bring in CBG monitor from home?: No  How often do you need to have someone help you when you read instructions, pamphlets, or other written materials from your doctor or pharmacy?: 1 - Never What is the last grade level you completed in school?: Associate degree  Interpreter Needed?: No  Comments: pt lives with significant other Information entered by :: LPinson, LPN  Past Medical History:  Diagnosis Date  . Alcohol abuse, unspecified   . CAD (coronary artery disease) 9/06   CABG x4  . Colon polyps   . Depression   . DMII (diabetes mellitus, type 2) ([place] 10/03  . HLD (hyperlipidemia) 10/03  . HTN (hypertension) 10/03   Past Surgical History:  Procedure Laterality Date  . COLONOSCOPY    .  CORONARY ARTERY BYPASS GRAFT  9/06   x4  . ESOPHAGOGASTRODUODENOSCOPY  7/07   polyp; mucosal abnml  . TONSILLECTOMY     Family History  Problem Relation Age of Onset  . Alcohol abuse Father   . Stroke Father   . Other Father        cardiac problems  . Heart disease Father   . COPD Mother   . Alcohol abuse Brother   . Cirrhosis Brother        EtOH  . Anxiety disorder Brother   . Heart disease Paternal Grandmother   . Colon cancer Neg Hx   . Esophageal cancer Neg Hx   . Rectal cancer Neg Hx   . Stomach cancer Neg Hx    Social History   Socioeconomic History  . Marital status: Married    Spouse name: Not on file  . Number of children: Not on file  . Years of education: Not on file  . Highest education level: Not on file  Occupational History  . Not on file  Social Needs  . Financial resource strain: Not on file  . Food insecurity:    Worry: Not on file    Inability: Not on file  . Transportation needs:    Medical: Not on file    Non-medical: Not on file  Tobacco Use  . Smoking status: Former SResearch scientist (life sciences) . Smokeless tobacco:  Never Used  . Tobacco comment: quit 41 years ago  Substance and Sexual Activity  . Alcohol use: Yes    Alcohol/week: 17.0 standard drinks    Types: 15 Shots of liquor, 2 Glasses of wine per week    Comment: Regular  . Drug use: No  . Sexual activity: Not on file  Lifestyle  . Physical activity:    Days per week: Not on file    Minutes per session: Not on file  . Stress: Not on file  Relationships  . Social connections:    Talks on phone: Not on file    Gets together: Not on file    Attends religious service: Not on file    Active member of club or organization: Not on file    Attends meetings of clubs or organizations: Not on file    Relationship status: Not on file  Other Topics Concern  . Not on file  Social History Narrative   Wife Eustaquio Maize is deceased   He is retired.    Enjoys firearms and recreational shooting.     Outpatient  Encounter Medications as of 07/21/2018  Medication Sig  . ACCU-CHEK SOFTCLIX LANCETS lancets Check blood sugar once daily and as instructed. Dx E11.65  . amLODipine (NORVASC) 10 MG tablet Take 1 tablet (10 mg total) by mouth daily.  Marland Kitchen aspirin 81 MG tablet Take 1 tablet (81 mg total) by mouth daily.  Marland Kitchen atorvastatin (LIPITOR) 80 MG tablet TAKE 1 TABLET BY MOUTH ONCE DAILY (Patient taking differently: Take 80 mg by mouth 3 (three) times a week. )  . Blood Glucose Monitoring Suppl (ACCU-CHEK AVIVA PLUS) w/Device KIT Check blood sugar once daily and as instructed. Dx E11.65  . calcium carbonate (TUMS - DOSED IN MG ELEMENTAL CALCIUM) 500 MG chewable tablet Chew 1 tablet by mouth daily as needed for indigestion or heartburn.  . fish oil-omega-3 fatty acids 1000 MG capsule Take 2 g by mouth daily.    Marland Kitchen glucose blood (ACCU-CHEK AVIVA PLUS) test strip Check blood sugar once daily and as instructed. Dx E11.65  . losartan (COZAAR) 50 MG tablet TAKE 1 TABLET BY MOUTH EVERY DAY  . metFORMIN (GLUCOPHAGE-XR) 500 MG 24 hr tablet TAKE 4 TABLETS BY MOUTH ONCE DAILY WITH BREAKFAST  . metoprolol tartrate (LOPRESSOR) 50 MG tablet Take 1 tablet (50 mg total) by mouth 2 (two) times daily.  . Multiple Vitamin (MULTIVITAMIN) tablet Take 1 tablet by mouth daily.    . sildenafil (REVATIO) 20 MG tablet Generic Revatio / Sildanefil 20 mg. 2 - 5 tabs 30 mins prior to intercourse.   No facility-administered encounter medications on file as of 07/21/2018.     Activities of Daily Living In your present state of health, do you have any difficulty performing the following activities: 07/21/2018  Hearing? N  Vision? N  Difficulty concentrating or making decisions? N  Walking or climbing stairs? N  Dressing or bathing? N  Doing errands, shopping? N  Preparing Food and eating ? N  Using the Toilet? N  In the past six months, have you accidently leaked urine? Y  Do you have problems with loss of bowel control? N  Managing your  Medications? N  Managing your Finances? N  Housekeeping or managing your Housekeeping? N  Some recent data might be hidden    Patient Care Team: [name], MD as PCP - General Rockey Situ Kathlene November, MD as Consulting Physician (Cardiology)    Assessment:   This is a routine  wellness examination for [facility]   Hearing Screening   _0  _1  _2  _3  _4  _5  _6  _7  _8   Right ear:   40 40 40  0    Left ear:   40 40 40  40      Visual Acuity Screening   Right eye Left eye Both eyes  Without correction: 20/30-2 20/40-2 20/20-1  With correction:     Comments: Vision exam in Nov 2018 @ [facility]   Exercise Activities and Dietary recommendations Current Exercise Habits: The patient does not participate in regular exercise at present, Exercise limited by: None identified  Goals    . Patient Stated     Starting 07/21/2018, I will continue to take medications as prescribed.        Fall Risk Fall Risk  07/21/2018 06/13/2017 06/06/2016 05/30/2015 05/24/2014  Falls in the past year? 0 No No No No   Depression Screen PHQ 2/9 Scores 07/21/2018 06/13/2017 06/06/2016 05/30/2015  PHQ - 2 Score 0 0 0 0  PHQ- 9 Score 0 - - -     Cognitive Function MMSE - Mini Mental State Exam 07/21/2018  Orientation to time 5  Orientation to Place 5  Registration 3  Attention/ Calculation 0  Recall 1  Recall-comments unable to recall 2 of 3 words  Language- name 2 objects 0  Language- repeat 1  Language- follow 3 step command 3  Language- read & follow direction 0  Write a sentence 0  Copy design 0  Total score 18     PLEASE NOTE: A Mini-Cog screen was completed. Maximum score is 20. A value of 0 denotes this part of Folstein MMSE was not completed or the patient failed this part of the Mini-Cog screening.   Mini-Cog Screening Orientation to Time - Max 5 pts Orientation to Place - Max 5 pts Registration - Max 3 pts Recall - Max 3 pts Language Repeat - Max 1  pts Language Follow 3 Step Command - Max 3 pts     Immunization History  Administered Date(s) Administered  . Influenza Split 08/02/2011, 06/11/2012  . Influenza Whole 08/10/2010  . Influenza, High Dose Seasonal PF 05/18/2014  . Influenza,inj,Quad PF,6+ Mos 05/25/2013, 05/30/2015, 06/13/2017  . Influenza-Unspecified 05/16/2016  . Pneumococcal Conjugate-13 05/30/2015  . Pneumococcal Polysaccharide-23 10/11/2011  . Td 08/20/1993, 04/12/2009    Screening Tests Health Maintenance  Topic Date Due  . INFLUENZA VACCINE  07/30/2018 (Originally 03/20/2018)  . FOOT EXAM  07/30/2018 (Originally 05/30/2016)  . OPHTHALMOLOGY EXAM  06/21/2019 (Originally 06/25/2018)  . COLONOSCOPY  08/20/2019 (Originally 07/19/2017)  . HEMOGLOBIN A1C  01/20/2019  . TETANUS/TDAP  04/13/2019  . Hepatitis C Screening  Completed  . PNA vac Low Risk Adult  Completed   Plan:     I have personally reviewed, addressed, and noted the following in the patient's chart:  A. Medical and social history B. Use of alcohol, tobacco or illicit drugs  C. Current medications and supplements D. Functional ability and status E.  Nutritional status F.  Physical activity G. Advance directives H. List of other physicians I.  Hospitalizations, surgeries, and ER visits in previous 12 months J.  [place] to include hearing, vision, cognitive, depression L. Referrals and appointments - none  In addition, I have reviewed and discussed with patient certain preventive protocols, quality metrics, and best practice recommendations. A written personalized care plan for preventive services as well as general preventive health recommendations were provided to patient.  See attached scanned  questionnaire for additional information.   Signed,   Lindell Noe, MHA, BS, LPN Health Coach

## 2018-07-24 ENCOUNTER — Telehealth: Payer: Self-pay | Admitting: Family Medicine

## 2018-07-24 NOTE — Telephone Encounter (Signed)
Left message asking pt to call office  Please r/s 12/11 appointment with dr copland to 12/9 @ 9 if pt is ok with date and time.  If not please r/s with kate in dec or with dr copland in [place] or feb  Due to limited schedule at this time

## 2018-07-24 NOTE — Telephone Encounter (Signed)
Please disregard phone message left for pt   Confirm pt appointment with dr copland on 12/11 @ 11

## 2018-07-24 NOTE — Telephone Encounter (Signed)
If pt calls back please let me talk to him if he cannot come in 12/9 @ 9

## 2018-07-26 ENCOUNTER — Other Ambulatory Visit: Payer: Self-pay | Admitting: Cardiovascular Disease

## 2018-07-27 NOTE — Progress Notes (Signed)
I reviewed health advisor's note, was available for consultation on the day of service listed in this note, and agree with documentation and plan. Jalaiya Oyster, MD.   

## 2018-07-28 NOTE — Telephone Encounter (Signed)
Please review for refill.  

## 2018-07-30 ENCOUNTER — Ambulatory Visit (INDEPENDENT_AMBULATORY_CARE_PROVIDER_SITE_OTHER): Payer: Medicare HMO | Admitting: Family Medicine

## 2018-07-30 ENCOUNTER — Encounter: Payer: Medicare HMO | Admitting: Family Medicine

## 2018-07-30 ENCOUNTER — Encounter: Payer: Self-pay | Admitting: Family Medicine

## 2018-07-30 VITALS — BP 146/76 | HR 57 | Temp 98.5°F | Ht 67.0 in | Wt 177.5 lb

## 2018-07-30 DIAGNOSIS — Z0000 Encounter for general adult medical examination without abnormal findings: Secondary | ICD-10-CM

## 2018-07-30 MED ORDER — LOSARTAN POTASSIUM 100 MG PO TABS: 100.0000 mg | ORAL_TABLET | Freq: Every day | ORAL | 3 refills | Status: DC

## 2018-07-30 NOTE — Progress Notes (Signed)
Dr. Frederico Hamman T. Orlando Devereux, MD, [facility] Primary [facility] and Sports Medicine [place], [address] Phone: [phone] Fax: [phone]  07/30/2018  Patient: [name], MRN: [id number], DOB: 1945-11-03, 72 y.o.  Primary Physician:  [name], MD   Chief Complaint  Patient presents with  . Annual Exam    Part 2   Subjective:   [name] is a 72 y.o. pleasant patient who presents with the following:  Preventative Health Maintenance Visit:  Health Maintenance Summary Reviewed and updated, unless pt declines services.  Tobacco History Reviewed. Alcohol: No concerns, no excessive use Exercise Habits: Some activity, rec at least 30 mins 5 times a week STD concerns: no risk or activity to increase risk Drug Use: None Encouraged self-testicular check  Diabetes Mellitus: Tolerating Medications: yes Compliance with diet: fair Exercise: minimal / intermittent Avg blood sugars at home: not checking Foot problems: none Hypoglycemia: none No nausea, vomitting, blurred vision, polyuria.  Mole on posterior head  Lab Results  Component Value Date   HGBA1C 6.9 (H) 07/21/2018   HGBA1C 6.3 02/10/2018   HGBA1C 6.1 09/13/2017   Lab Results  Component Value Date   MICROALBUR 0.9 02/10/2018   LDLCALC 62 07/21/2018   CREATININE 1.24 07/21/2018    Wt Readings from Last 3 Encounters:  07/30/18 177 lb 8 oz (80.5 kg)  07/21/18 179 lb 8 oz (81.4 kg)  05/26/18 173 lb 8 oz (78.7 kg)   Body mass index is 27.8 kg/m.   Health Maintenance  Topic Date Due  . INFLUENZA VACCINE  07/30/2018 (Originally 03/20/2018)  . FOOT EXAM  07/30/2018 (Originally 05/30/2016)  . OPHTHALMOLOGY EXAM  06/21/2019 (Originally 06/25/2018)  . COLONOSCOPY  08/20/2019 (Originally 07/19/2017)  . HEMOGLOBIN A1C  01/20/2019  . TETANUS/TDAP  04/13/2019  . Hepatitis C Screening  Completed  . PNA vac Low Risk Adult  Completed   Immunization History  Administered Date(s)  Administered  . Influenza Split 08/02/2011, 06/11/2012  . Influenza Whole 08/10/2010  . Influenza, High Dose Seasonal PF 05/18/2014  . Influenza,inj,Quad PF,6+ Mos 05/25/2013, 05/30/2015, 06/13/2017  . Influenza-Unspecified 05/16/2016  . Pneumococcal Conjugate-13 05/30/2015  . Pneumococcal Polysaccharide-23 10/11/2011  . Td 08/20/1993, 04/12/2009   Patient Active Problem List   Diagnosis Date Noted  . CAD (coronary artery disease), autologous vein bypass graft 10/11/2011    Priority: High  . Diabetes type 2, uncontrolled ([place]) 04/13/2009    Priority: High  . HTN (hypertension)     Priority: Medium  . Hyperlipidemia LDL goal <70 05/13/2008    Priority: Medium  . CAD (coronary artery disease), native coronary artery 05/25/2018  . Alcohol abuse 08/10/2010  . History of colonic polyps 03/12/2007  . DEPRESSION 12/03/2006   Past Medical History:  Diagnosis Date  . Alcohol abuse, unspecified   . CAD (coronary artery disease) 9/06   CABG x4  . Colon polyps   . Depression   . DMII (diabetes mellitus, type 2) ([place]) 10/03  . HLD (hyperlipidemia) 10/03  . HTN (hypertension) 10/03   Past Surgical History:  Procedure Laterality Date  . COLONOSCOPY    . CORONARY ARTERY BYPASS GRAFT  9/06   x4  . ESOPHAGOGASTRODUODENOSCOPY  7/07   polyp; mucosal abnml  . TONSILLECTOMY     Social History   Socioeconomic History  . Marital status: Married    Spouse name: Not on file  . Number of children: Not on file  . Years of education: Not on file  .  Highest education level: Not on file  Occupational History  . Not on file  Social Needs  . Financial resource strain: Not on file  . Food insecurity:    Worry: Not on file    Inability: Not on file  . Transportation needs:    Medical: Not on file    Non-medical: Not on file  Tobacco Use  . Smoking status: Former Research scientist (life sciences)  . Smokeless tobacco: Never Used  . Tobacco comment: quit 41 years ago  Substance and Sexual Activity  . Alcohol  use: Yes    Alcohol/week: 17.0 standard drinks    Types: 15 Shots of liquor, 2 Glasses of wine per week    Comment: Regular  . Drug use: No  . Sexual activity: Not on file  Lifestyle  . Physical activity:    Days per week: Not on file    Minutes per session: Not on file  . Stress: Not on file  Relationships  . Social connections:    Talks on phone: Not on file    Gets together: Not on file    Attends religious service: Not on file    Active member of club or organization: Not on file    Attends meetings of clubs or organizations: Not on file    Relationship status: Not on file  . Intimate partner violence:    Fear of current or ex partner: Not on file    Emotionally abused: Not on file    Physically abused: Not on file    Forced sexual activity: Not on file  Other Topics Concern  . Not on file  Social History Narrative   Wife Eustaquio Maize is deceased   He is retired.    Enjoys firearms and recreational shooting.    Family History  Problem Relation Age of Onset  . Alcohol abuse Father   . Stroke Father   . Other Father        cardiac problems  . Heart disease Father   . COPD Mother   . Alcohol abuse Brother   . Cirrhosis Brother        EtOH  . Anxiety disorder Brother   . Heart disease Paternal Grandmother   . Colon cancer Neg Hx   . Esophageal cancer Neg Hx   . Rectal cancer Neg Hx   . Stomach cancer Neg Hx    Allergies  Allergen Reactions  . Sulfonamide Derivatives     REACTION: unknown reaction    Medication list has been reviewed and updated.   General: Denies fever, chills, sweats. No significant weight loss. Eyes: Denies blurring,significant itching ENT: Denies earache, sore throat, and hoarseness. Cardiovascular: Denies chest pains, palpitations, dyspnea on exertion Respiratory: Denies cough, dyspnea at rest,wheeezing Breast: no concerns about lumps GI: Denies nausea, vomiting, diarrhea, constipation, change in bowel habits, abdominal pain, melena,  hematochezia GU: Denies penile discharge, ED, urinary flow / outflow problems. No STD concerns. Musculoskeletal: Denies back pain, joint pain Derm: Denies rash, itching Neuro: Denies  paresthesias, frequent falls, frequent headaches Psych: Denies depression, anxiety Endocrine: Denies cold intolerance, heat intolerance, polydipsia Heme: Denies enlarged lymph nodes Allergy: No hayfever  Objective:   BP (!) 146/76   Pulse (!) 57   Temp 98.5 F (36.9 C) (Oral)   Ht 5' 7"  (1.702 m)   Wt 177 lb 8 oz (80.5 kg)   SpO2 96%   BMI 27.80 kg/m  Ideal Body Weight: Weight in (lb) to have BMI = 25: 159.3  No exam  data present   152/75  GEN: well developed, well nourished, no acute distress Eyes: conjunctiva and lids normal, PERRLA, EOMI ENT: TM clear, nares clear, oral exam WNL Neck: supple, no lymphadenopathy, no thyromegaly, no JVD Pulm: clear to auscultation and percussion, respiratory effort normal CV: regular rate and rhythm, S1-S2, no murmur, rub or gallop, no bruits, peripheral pulses normal and symmetric, no cyanosis, clubbing, edema or varicosities GI: soft, non-tender; no hepatosplenomegaly, masses; active bowel sounds all quadrants GU: no hernia, testicular mass, penile discharge Lymph: no cervical, axillary or inguinal adenopathy MSK: gait normal, muscle tone and strength WNL, no joint swelling, effusions, discoloration, crepitus  SKIN: clear, good turgor, color WNL, no rashes, lesions, or ulcerations Neuro: normal mental status, normal strength, sensation, and motion Psych: alert; oriented to person, place and time, normally interactive and not anxious or depressed in appearance. All labs reviewed with patient.  Lipids: Lab Results  Component Value Date   CHOL 137 07/21/2018   Lab Results  Component Value Date   HDL 37.00 (L) 07/21/2018   Lab Results  Component Value Date   LDLCALC 62 07/21/2018   Lab Results  Component Value Date   TRIG 188.0 (H) 07/21/2018    Lab Results  Component Value Date   CHOLHDL 4 07/21/2018   CBC: CBC Latest Ref Rng & Units 02/10/2018 06/13/2017 06/04/2016  WBC 4.0 - 10.5 K/uL 4.5 4.9 6.0  Hemoglobin 13.0 - 17.0 g/dL 15.2 15.8 15.5  Hematocrit 39.0 - 52.0 % 42.6 45.5 44.4  Platelets 150.0 - 400.0 K/uL 156.0 172.0 383.2    Basic Metabolic Panel:    Component Value Date/Time   NA 140 07/21/2018 1231   K 4.7 07/21/2018 1231   CL 102 07/21/2018 1231   CO2 29 07/21/2018 1231   BUN 20 07/21/2018 1231   CREATININE 1.24 07/21/2018 1231   GLUCOSE 151 (H) 07/21/2018 1231   CALCIUM 10.6 (H) 07/21/2018 1231   Hepatic Function Latest Ref Rng & Units 07/21/2018 02/10/2018 06/13/2017  Total Protein 6.0 - 8.3 g/dL 7.9 7.1 7.5  Albumin 3.5 - 5.2 g/dL 4.9 4.6 4.7  AST 0 - 37 U/L 19 21 22   ALT 0 - 53 U/L 29 23 36  Alk Phosphatase 39 - 117 U/L 45 38(L) 46  Total Bilirubin 0.2 - 1.2 mg/dL 0.8 0.9 1.0  Bilirubin, Direct 0.0 - 0.3 mg/dL - 0.2 0.2    Lab Results  Component Value Date   TSH 1.67 08/10/2010   Lab Results  Component Value Date   PSA 1.29 02/10/2018   PSA 2.25 06/13/2017   PSA 2.87 06/04/2016    Assessment and Plan:   Routine general medical examination at a health care facility  [name] has DM, CAD s/p CABG and HTN is persistently not at goal. Will increase his losartan to 100 mg a day and otherwise continue his metoprolol and norvasc.   Lipids are at goal.  ETOH about 3 drinks a day and LFT's normal.   a1c is stable  Health Maintenance Exam: The patient's preventative maintenance and recommended screening tests for an annual wellness exam were reviewed in full today. Brought up to date unless services declined.  Counselled on the importance of diet, exercise, and its role in overall health and mortality. The patient's FH and SH was reviewed, including their home life, tobacco status, and drug and alcohol status.  Follow-up in 1 year for physical exam or additional follow-up below.  Follow-up:  No follow-ups on file. Or follow-up in 1  year if not noted.  Signed,  Maud Deed. Sallie Maker, MD   Allergies as of 07/30/2018      Reactions   Sulfonamide Derivatives    REACTION: unknown reaction      Medication List        Accurate as of 07/30/18 10:44 AM. Always use your most recent med list.          ACCU-CHEK AVIVA PLUS w/Device Kit Check blood sugar once daily and as instructed. Dx E11.65   ACCU-CHEK SOFTCLIX LANCETS lancets Check blood sugar once daily and as instructed. Dx E11.65   amLODipine 10 MG tablet Commonly known as:  NORVASC Take 1 tablet (10 mg total) by mouth daily.   aspirin 81 MG tablet Take 1 tablet (81 mg total) by mouth daily.   atorvastatin 80 MG tablet Commonly known as:  LIPITOR TAKE 1 TABLET BY MOUTH ONCE DAILY   calcium carbonate 500 MG chewable tablet Commonly known as:  TUMS - dosed in mg elemental calcium Chew 1 tablet by mouth daily as needed for indigestion or heartburn.   fish oil-omega-3 fatty acids 1000 MG capsule Take 2 g by mouth daily.   glucose blood test strip Check blood sugar once daily and as instructed. Dx E11.65   losartan 50 MG tablet Commonly known as:  COZAAR TAKE 1 TABLET BY MOUTH EVERY DAY   metFORMIN 500 MG 24 hr tablet Commonly known as:  GLUCOPHAGE-XR TAKE 4 TABLETS BY MOUTH ONCE DAILY WITH BREAKFAST   metoprolol tartrate 50 MG tablet Commonly known as:  LOPRESSOR Take 1 tablet (50 mg total) by mouth 2 (two) times daily.   multivitamin tablet Take 1 tablet by mouth daily.   sildenafil 20 MG tablet Commonly known as:  REVATIO Generic Revatio / Sildanefil 20 mg. 2 - 5 tabs 30 mins prior to intercourse.

## 2018-08-29 DIAGNOSIS — Z1212 Encounter for screening for malignant neoplasm of rectum: Secondary | ICD-10-CM | POA: Diagnosis not present

## 2018-08-29 DIAGNOSIS — Z1211 Encounter for screening for malignant neoplasm of colon: Secondary | ICD-10-CM | POA: Diagnosis not present

## 2018-08-30 ENCOUNTER — Other Ambulatory Visit: Payer: Self-pay | Admitting: Family Medicine

## 2018-08-30 ENCOUNTER — Other Ambulatory Visit: Payer: Self-pay | Admitting: Cardiovascular Disease

## 2018-09-03 ENCOUNTER — Telehealth: Payer: Self-pay | Admitting: *Deleted

## 2018-09-03 LAB — COLOGUARD: COLOGUARD: NEGATIVE

## 2018-09-03 MED ORDER — ATORVASTATIN CALCIUM 80 MG PO TABS: 80.0000 mg | ORAL_TABLET | Freq: Every day | ORAL | 3 refills | Status: DC

## 2018-09-03 NOTE — Telephone Encounter (Signed)
It appears the dose was changed 08/30/2018 at the time of refill.  Records reviewed - mine and Dr. Rockey Situ.  Please change the dosing to 1 po daily. It is an error.   #90 with 3 refills is OK, too

## 2018-09-03 NOTE — Telephone Encounter (Signed)
New prescription for one a day dosing sent to [facility] as instructed by Dr. Lorelei Pont.

## 2018-09-03 NOTE — Telephone Encounter (Signed)
Received fax from [facility] stating patient was on atorvastatin 80 mg daily and now the new Rx they received on 09/01/2018 states to take it three times a week.  Patient is unsure if this is correct.  Please advise.

## 2018-09-04 ENCOUNTER — Encounter: Payer: Self-pay | Admitting: Family Medicine

## 2018-09-18 DIAGNOSIS — E119 Type 2 diabetes mellitus without complications: Secondary | ICD-10-CM | POA: Diagnosis not present

## 2018-09-18 LAB — HM DIABETES EYE EXAM

## 2018-09-22 ENCOUNTER — Encounter: Payer: Self-pay | Admitting: Family Medicine

## 2018-10-17 ENCOUNTER — Telehealth: Payer: Self-pay | Admitting: Cardiovascular Disease

## 2018-10-17 NOTE — Telephone Encounter (Signed)
   [facility] Pre-operative Risk Assessment    Request for surgical clearance:  1. What type of surgery is being performed? DENTAL TREATMENT  2. When is this surgery scheduled? NOT LISTED   3. What type of clearance is required (medical clearance vs. Pharmacy clearance to hold med vs. Both)? NOT LISTED   4. Are there any medications that need to be held prior to surgery and how long? NOT LISTED  5. Practice name and name of physician performing surgery? [facility] , PHYSICIAN NOT LISTED  6. What is your office phone number [phone]   7.   What is your office fax number [phone]  8.   Anesthesia type (None, local, MAC, general) ? LOCAL   [name] 10/17/2018, 3:13 PM  _________________________________________________________________   (provider comments below)

## 2018-10-17 NOTE — Telephone Encounter (Signed)
Lmvm-need clarification

## 2018-10-20 ENCOUNTER — Telehealth: Payer: Self-pay | Admitting: Family Medicine

## 2018-10-20 MED ORDER — ATORVASTATIN CALCIUM 80 MG PO TABS: ORAL_TABLET | ORAL | 3 refills | Status: DC

## 2018-10-20 NOTE — Telephone Encounter (Signed)
   Primary Cardiologist: Ida Rogue, MD  Chart reviewed as part of pre-operative protocol coverage. Patient was contacted 10/20/2018 in reference to pre-operative risk assessment for pending surgery as outlined below.  [name] was last seen on 05/26/2018 by Dr. Rockey Situ.  Since that day, [name] has done well with no exertional chest discomfort or shortness of breath.  He has no hx of heart failure and no current symptoms of heart failure. He has no hx of arrhythmias and no current palpitations or lightheadedness.  Therefore, based on ACC/AHA guidelines, the patient would be at acceptable risk for the planned procedure without further cardiovascular testing.   With stable CAD, patients can generally tolerate limited amounts of epinephrine. We recommend using epinephrine at the lowest dose acceptable for the procedure.   I will route this recommendation to the requesting party via Epic fax function and remove from pre-op pool.  Please call with questions.  Daune Perch, NP 10/20/2018, 2:01 PM

## 2018-10-20 NOTE — Telephone Encounter (Signed)
Pt came by office to leave message for Dr. Lorelei Pont. He states cardiologist told him to take Atorvastatin as a whole pill Monday, Wednesday, and Friday and then to only take a half pill on Tuesday, Thursday, Saturday, and Sunday. He states it got messed up in the system and the pharmacy is only filling for him to have 3 pills a week. He is requesting to have this fixed. He can be reached at  [phone].

## 2018-10-20 NOTE — Telephone Encounter (Signed)
Spoke with Mr. Gopal and confirmed new dosing instructions on his atorvastatin.  New Rx sent to [facility] in [place] with new instructions.

## 2018-10-20 NOTE — Telephone Encounter (Signed)
I called and spoke to the dentist office for clarification. The patient is planned for scaling, deep cleaning, root planing, filling, 4 extractions with possible bone grafting. Their main question is with the use of epinephrine.

## 2018-10-20 NOTE — Telephone Encounter (Signed)
Attempted to call pt to review current status for clearance. "Voice mailbox has not been set up". No message was able to be left for pt to return call.

## 2018-11-20 ENCOUNTER — Other Ambulatory Visit: Payer: Self-pay | Admitting: Family Medicine

## 2019-01-28 ENCOUNTER — Ambulatory Visit: Payer: Medicare HMO | Admitting: Family Medicine

## 2019-03-09 ENCOUNTER — Ambulatory Visit (INDEPENDENT_AMBULATORY_CARE_PROVIDER_SITE_OTHER): Payer: Medicare HMO | Admitting: Family Medicine

## 2019-03-09 ENCOUNTER — Other Ambulatory Visit: Payer: Self-pay

## 2019-03-09 ENCOUNTER — Encounter: Payer: Self-pay | Admitting: Family Medicine

## 2019-03-09 VITALS — BP 150/80 | HR 67 | Ht 67.0 in | Wt 175.5 lb

## 2019-03-09 DIAGNOSIS — I10 Essential (primary) hypertension: Secondary | ICD-10-CM | POA: Diagnosis not present

## 2019-03-09 DIAGNOSIS — E118 Type 2 diabetes mellitus with unspecified complications: Secondary | ICD-10-CM | POA: Diagnosis not present

## 2019-03-09 DIAGNOSIS — E785 Hyperlipidemia, unspecified: Secondary | ICD-10-CM

## 2019-03-09 DIAGNOSIS — Z79899 Other long term (current) drug therapy: Secondary | ICD-10-CM

## 2019-03-09 LAB — CBC WITH DIFFERENTIAL/PLATELET
Basophils Absolute: 0 10*3/uL (ref 0.0–0.1)
Basophils Relative: 0.9 % (ref 0.0–3.0)
Eosinophils Absolute: 0.2 10*3/uL (ref 0.0–0.7)
Eosinophils Relative: 4.2 % (ref 0.0–5.0)
HCT: 43.3 % (ref 39.0–52.0)
Hemoglobin: 15.1 g/dL (ref 13.0–17.0)
Lymphocytes Relative: 31.5 % (ref 12.0–46.0)
Lymphs Abs: 1.6 10*3/uL (ref 0.7–4.0)
MCHC: 34.9 g/dL (ref 30.0–36.0)
MCV: 94.7 fl (ref 78.0–100.0)
Monocytes Absolute: 0.5 10*3/uL (ref 0.1–1.0)
Monocytes Relative: 10.3 % (ref 3.0–12.0)
Neutro Abs: 2.7 10*3/uL (ref 1.4–7.7)
Neutrophils Relative %: 53.1 % (ref 43.0–77.0)
Platelets: 155 10*3/uL (ref 150.0–400.0)
RBC: 4.58 Mil/uL (ref 4.22–5.81)
RDW: 12.7 % (ref 11.5–15.5)
WBC: 5 10*3/uL (ref 4.0–10.5)

## 2019-03-09 LAB — BASIC METABOLIC PANEL
BUN: 18 mg/dL (ref 6–23)
CO2: 29 mEq/L (ref 19–32)
Calcium: 10.2 mg/dL (ref 8.4–10.5)
Chloride: 102 mEq/L (ref 96–112)
Creatinine, Ser: 1.18 mg/dL (ref 0.40–1.50)
GFR: 60.54 mL/min (ref >60.00)
Glucose, Bld: 151 mg/dL — ABNORMAL HIGH (ref 70–99)
Potassium: 4.2 mEq/L (ref 3.5–5.1)
Sodium: 140 mEq/L (ref 135–145)

## 2019-03-09 LAB — LIPID PANEL
Cholesterol: 119 mg/dL (ref 0–200)
HDL: 32.8 mg/dL — ABNORMAL LOW (ref >39.00)
LDL Cholesterol: 55 mg/dL (ref 0–99)
NonHDL: 85.86
Total CHOL/HDL Ratio: 4
Triglycerides: 155 mg/dL — ABNORMAL HIGH (ref 0.0–149.0)
VLDL: 31 mg/dL (ref 0.0–40.0)

## 2019-03-09 LAB — HEMOGLOBIN A1C: Hgb A1c MFr Bld: 6.6 % — ABNORMAL HIGH (ref 4.6–6.5)

## 2019-03-09 LAB — HEPATIC FUNCTION PANEL
ALT: 24 U/L (ref 0–53)
AST: 21 U/L (ref 0–37)
Albumin: 5 g/dL (ref 3.5–5.2)
Alkaline Phosphatase: 36 U/L — ABNORMAL LOW (ref 39–117)
Bilirubin, Direct: 0.2 mg/dL (ref 0.0–0.3)
Total Bilirubin: 1.1 mg/dL (ref 0.2–1.2)
Total Protein: 7.3 g/dL (ref 6.0–8.3)

## 2019-03-09 NOTE — Progress Notes (Signed)
Gregory Triggs T. Porter [name], MD Primary Care and [place] at [facility] [place], [address] Phone: [phone]  FAX: [phone]  [name] - 73 y.o. male  MRN [id number]  Date of Birth: 1945-08-21  Visit Date: 03/09/2019  PCP: [name], MD  Referred by: [name], MD  Chief Complaint  Patient presents with  . Follow-up   Subjective:   [name] is a 73 y.o. very pleasant male patient who presents with the following:  Diabetes Mellitus: Tolerating Medications: yes Compliance with diet: fairly good, Body mass index is 27.49 kg/m. Exercise: minimal / intermittent Avg blood sugars at home: not checking Foot problems: none Hypoglycemia: none No nausea, vomitting, blurred vision, polyuria.  Has been doing well with his diet  Lab Results  Component Value Date   HGBA1C 6.9 (H) 07/21/2018   HGBA1C 6.3 02/10/2018   HGBA1C 6.1 09/13/2017   Lab Results  Component Value Date   MICROALBUR 0.9 02/10/2018   LDLCALC 62 07/21/2018   CREATININE 1.24 07/21/2018    Wt Readings from Last 3 Encounters:  03/09/19 175 lb 8 oz (79.6 kg)  07/30/18 177 lb 8 oz (80.5 kg)  07/21/18 179 lb 8 oz (81.4 kg)    HTN: Tolerating all medications without side effects Stable and at goal No CP, no sob. No HA.  BP Readings from Last 3 Encounters:  03/09/19 (!) 150/80  07/30/18 (!) 146/76  07/21/18 140/84   Suspect ETOH.   Basic Metabolic Panel:    Component Value Date/Time   NA 140 07/21/2018 1231   K 4.7 07/21/2018 1231   CL 102 07/21/2018 1231   CO2 29 07/21/2018 1231   BUN 20 07/21/2018 1231   CREATININE 1.24 07/21/2018 1231   GLUCOSE 151 (H) 07/21/2018 1231   CALCIUM 10.6 (H) 07/21/2018 1231    Lipids: Doing well, stable. Tolerating meds fine with no SE. Panel reviewed with patient.  Lipids:    Component Value Date/Time   CHOL 137 07/21/2018 1231   TRIG 188.0 (H) 07/21/2018 1231   HDL 37.00  (L) 07/21/2018 1231   LDLDIRECT 61.0 02/10/2018 0844   VLDL 37.6 07/21/2018 1231   CHOLHDL 4 07/21/2018 1231    Lab Results  Component Value Date   ALT 29 07/21/2018   AST 19 07/21/2018   ALKPHOS 45 07/21/2018   BILITOT 0.8 07/21/2018     Past Medical History, Surgical History, Social History, Family History, Problem List, Medications, and Allergies have been reviewed and updated if relevant.  Patient Active Problem List   Diagnosis Date Noted  . CAD (coronary artery disease), autologous vein bypass graft 10/11/2011    Priority: High  . Diabetes type 2, uncontrolled ([place]) 04/13/2009    Priority: High  . HTN (hypertension)     Priority: Medium  . Hyperlipidemia LDL goal <70 05/13/2008    Priority: Medium  . CAD (coronary artery disease), native coronary artery 05/25/2018  . Alcohol abuse 08/10/2010  . History of colonic polyps 03/12/2007  . DEPRESSION 12/03/2006    Past Medical History:  Diagnosis Date  . Alcohol abuse, unspecified   . CAD (coronary artery disease) 9/06   CABG x4  . Colon polyps   . Depression   . DMII (diabetes mellitus, type 2) ([place]) 10/03  . HLD (hyperlipidemia) 10/03  . HTN (hypertension) 10/03    Past Surgical History:  Procedure Laterality Date  . COLONOSCOPY    . CORONARY ARTERY BYPASS  GRAFT  9/06   x4  . ESOPHAGOGASTRODUODENOSCOPY  7/07   polyp; mucosal abnml  . TONSILLECTOMY      Social History   Socioeconomic History  . Marital status: Married    Spouse name: Not on file  . Number of children: Not on file  . Years of education: Not on file  . Highest education level: Not on file  Occupational History  . Not on file  Social Needs  . Financial resource strain: Not on file  . Food insecurity    Worry: Not on file    Inability: Not on file  . Transportation needs    Medical: Not on file    Non-medical: Not on file  Tobacco Use  . Smoking status: Former Research scientist (life sciences)  . Smokeless tobacco: Never Used  . Tobacco comment: quit 41  years ago  Substance and Sexual Activity  . Alcohol use: Yes    Alcohol/week: 17.0 standard drinks    Types: 15 Shots of liquor, 2 Glasses of wine per week    Comment: Regular  . Drug use: No  . Sexual activity: Not on file  Lifestyle  . Physical activity    Days per week: Not on file    Minutes per session: Not on file  . Stress: Not on file  Relationships  . Social Herbalist on phone: Not on file    Gets together: Not on file    Attends religious service: Not on file    Active member of club or organization: Not on file    Attends meetings of clubs or organizations: Not on file    Relationship status: Not on file  . Intimate partner violence    Fear of current or ex partner: Not on file    Emotionally abused: Not on file    Physically abused: Not on file    Forced sexual activity: Not on file  Other Topics Concern  . Not on file  Social History Narrative   Wife Eustaquio Maize is deceased   He is retired.    Enjoys firearms and recreational shooting.     Family History  Problem Relation Age of Onset  . Alcohol abuse Father   . Stroke Father   . Other Father        cardiac problems  . Heart disease Father   . COPD Mother   . Alcohol abuse Brother   . Cirrhosis Brother        EtOH  . Anxiety disorder Brother   . Heart disease Paternal Grandmother   . Colon cancer Neg Hx   . Esophageal cancer Neg Hx   . Rectal cancer Neg Hx   . Stomach cancer Neg Hx     Allergies  Allergen Reactions  . Sulfonamide Derivatives     REACTION: unknown reaction    Medication list reviewed and updated in full in August.  GEN: No acute illnesses, no fevers, chills. GI: No n/v/d, eating normally Pulm: No SOB Interactive and getting along well at home.  Otherwise, ROS is as per the HPI.  Objective:   BP (!) 150/80   Pulse 67   Ht 5' 7"  (1.702 m)   Wt 175 lb 8 oz (79.6 kg)   SpO2 97%   BMI 27.49 kg/m   GEN: WDWN, NAD, Non-toxic, A & O x 3 HEENT:  Atraumatic, Normocephalic. Neck supple. No masses, No LAD. Ears and Nose: No external deformity. CV: RRR, No M/G/R. No JVD.  No thrill. No extra heart sounds. PULM: CTA B, no wheezes, crackles, rhonchi. No retractions. No resp. distress. No accessory muscle use. EXTR: No c/c/e NEURO Normal gait.  PSYCH: Normally interactive. Conversant. Not depressed or anxious appearing.  Calm demeanor.   Laboratory and Imaging Data:  Assessment and Plan:     ICD-10-CM   1. Controlled type 2 diabetes mellitus with complication, without long-term current use of insulin (HCC)  E11.8 Hemoglobin A1c  2. Essential hypertension  I10   3. Hyperlipidemia LDL goal <70  E78.5 Lipid panel  4. Encounter for long-term (current) use of medications  L38.101 Basic metabolic panel    CBC with Differential/Platelet    Hepatic function panel   BP has been elevated, suspect due to ETOH, 5-6 shots at night with rebound. Check BP a couple of times a week and let me know in 1 month  Check all f/u labs  Follow-up: No follow-ups on file.  No orders of the defined types were placed in this encounter.  Orders Placed This Encounter  Procedures  . Basic metabolic panel  . CBC with Differential/Platelet  . Hepatic function panel  . Hemoglobin A1c  . Lipid panel    Signed,  Frederico Hamman T. Sanam Marmo, MD   Outpatient Encounter Medications as of 03/09/2019  Medication Sig  . ACCU-CHEK SOFTCLIX LANCETS lancets Check blood sugar once daily and as instructed. Dx E11.65  . amLODipine (NORVASC) 10 MG tablet TAKE 1 TABLET BY MOUTH ONCE DAILY  . aspirin 81 MG tablet Take 1 tablet (81 mg total) by mouth daily.  Marland Kitchen atorvastatin (LIPITOR) 80 MG tablet Take one tablet by mouth M,W,F and 1/2 tablet T,TH, Sat, Sun  . Blood Glucose Monitoring Suppl (ACCU-CHEK AVIVA PLUS) w/Device KIT Check blood sugar once daily and as instructed. Dx E11.65  . calcium carbonate (TUMS - DOSED IN MG ELEMENTAL CALCIUM) 500 MG chewable tablet Chew 1 tablet by  mouth daily as needed for indigestion or heartburn.  . fish oil-omega-3 fatty acids 1000 MG capsule Take 2 g by mouth daily.    Marland Kitchen glucose blood (ACCU-CHEK AVIVA PLUS) test strip Check blood sugar once daily and as instructed. Dx E11.65  . losartan (COZAAR) 100 MG tablet Take 1 tablet (100 mg total) by mouth daily.  . metFORMIN (GLUCOPHAGE-XR) 500 MG 24 hr tablet TAKE 4 TABLETS BY MOUTH ONCE DAILY WITH BREAKFAST  . metoprolol tartrate (LOPRESSOR) 50 MG tablet Take 1 tablet (50 mg total) by mouth 2 (two) times daily.  . Multiple Vitamin (MULTIVITAMIN) tablet Take 1 tablet by mouth daily.    . sildenafil (REVATIO) 20 MG tablet Generic Revatio / Sildanefil 20 mg. 2 - 5 tabs 30 mins prior to intercourse.   No facility-administered encounter medications on file as of 03/09/2019.

## 2019-05-19 ENCOUNTER — Other Ambulatory Visit: Payer: Self-pay | Admitting: Family Medicine

## 2019-05-31 NOTE — Progress Notes (Signed)
Cardiology Office Note  Date:  06/01/2019   ID:  [name], DOB 05-29-1946, MRN [id number]  PCP:  [name], MD   Chief Complaint  Patient presents with  . other    12 month follow up. "doing well."    HPI:   [name] is a very pleasant 73 yo gentleman with remote history of  coronary artery disease, bypass surgery in 2006 x5 vessel  hyperlipidemia,  diabetes, hypertension,  Continues to drink some alcohol daily who presents for routine followup of his coronary artery disease  10 K steps a day, very active Long driveway,  circles around the property No significant chest pain or shortness of breath concerning for ischemia  Lab work reviewed  total chol 119,LDL 55 HBA1C 6.6 , numbers discussed with him, medic improvement in the past 2 years  Prior history of ETOH 3 drinks every night, some shots, wine, mixers  EKG personally reviewed by myself on todays visit  showing sinus bradycardia, rate 59 bpm, no significant ST or T-wave changes  Other past medical history  labs dated 05/18/2014 showing total cholesterol 126, LDL 62, hemoglobin A1c 7.0 He reports that he is alternating Lipitor 80 mg with 40 mg   He has not had a cardiac catheterization since his bypass surgery   PMH:   has a past medical history of Alcohol abuse, unspecified, CAD (coronary artery disease) (9/06), Colon polyps, Depression, DMII (diabetes mellitus, type 2) ([place]) (10/03), HLD (hyperlipidemia) (10/03), and HTN (hypertension) (10/03).  PSH:    Past Surgical History:  Procedure Laterality Date  . COLONOSCOPY    . CORONARY ARTERY BYPASS GRAFT  9/06   x4  . ESOPHAGOGASTRODUODENOSCOPY  7/07   polyp; mucosal abnml  . TONSILLECTOMY      Current Outpatient Medications  Medication Sig Dispense Refill  . ACCU-CHEK SOFTCLIX LANCETS lancets Check blood sugar once daily and as instructed. Dx E11.65 100 each 3  . amLODipine (NORVASC) 10 MG tablet TAKE 1 TABLET BY MOUTH ONCE DAILY 180 tablet  3  . aspirin 81 MG tablet Take 1 tablet (81 mg total) by mouth daily.    Marland Kitchen atorvastatin (LIPITOR) 80 MG tablet Take one tablet by mouth M,W,F and 1/2 tablet T,TH, Sat, Sun 60 tablet 3  . Blood Glucose Monitoring Suppl (ACCU-CHEK AVIVA PLUS) w/Device KIT Check blood sugar once daily and as instructed. Dx E11.65 1 kit 0  . calcium carbonate (TUMS - DOSED IN MG ELEMENTAL CALCIUM) 500 MG chewable tablet Chew 1 tablet by mouth daily as needed for indigestion or heartburn.    . fish oil-omega-3 fatty acids 1000 MG capsule Take 2 g by mouth daily.      Marland Kitchen glucose blood (ACCU-CHEK AVIVA PLUS) test strip Check blood sugar once daily and as instructed. Dx E11.65 100 each 3  . losartan (COZAAR) 100 MG tablet TAKE 1 TABLET(100 MG) BY MOUTH DAILY 90 tablet 1  . metFORMIN (GLUCOPHAGE-XR) 500 MG 24 hr tablet TAKE 4 TABLETS BY MOUTH EVERY DAY WITH BREAKFAST 360 tablet 1  . metoprolol tartrate (LOPRESSOR) 50 MG tablet Take 1 tablet (50 mg total) by mouth 2 (two) times daily. 180 tablet 3  . Multiple Vitamin (MULTIVITAMIN) tablet Take 1 tablet by mouth daily.      . sildenafil (REVATIO) 20 MG tablet Generic Revatio / Sildanefil 20 mg. 2 - 5 tabs 30 mins prior to intercourse. (Patient not taking: Reported on 06/01/2019) 50 tablet 11   No current facility-administered medications for this visit.  Allergies:   Sulfonamide derivatives   Social History:  The patient  reports that he has quit smoking. He has never used smokeless tobacco. He reports current alcohol use of about 17.0 standard drinks of alcohol per week. He reports that he does not use drugs.   Family History:   family history includes Alcohol abuse in his brother and father; Anxiety disorder in his brother; COPD in his mother; Cirrhosis in his brother; Heart disease in his father and paternal grandmother; Other in his father; Stroke in his father.    Review of Systems: Review of Systems  Constitutional: Negative.   Respiratory: Negative.    Cardiovascular: Negative.   Gastrointestinal: Negative.   Musculoskeletal: Positive for joint pain.       Right shoulder pain  Neurological: Negative.   Psychiatric/Behavioral: Negative.   All other systems reviewed and are negative.   PHYSICAL EXAM: VS:  BP (!) 150/70 (BP Location: Left Arm, Patient Position: Sitting, Cuff Size: Normal)   Pulse (!) 58   Temp (!) 96.4 F (35.8 C)   Ht 5' 7.5" (1.715 m)   Wt 171 lb (77.6 kg)   BMI 26.39 kg/m  , BMI Body mass index is 26.39 kg/m.  Constitutional:  oriented to person, place, and time. No distress.  HENT:  Head: Grossly normal Eyes:  no discharge. No scleral icterus.  Neck: No JVD, no carotid bruits  Cardiovascular: Regular rate and rhythm, no murmurs appreciated Pulmonary/Chest: Clear to auscultation bilaterally, no wheezes or rails Abdominal: Soft.  no distension.  no tenderness.  Musculoskeletal: Normal range of motion Neurological:  normal muscle tone. Coordination normal. No atrophy Skin: Skin warm and dry Psychiatric: normal affect, pleasant  Recent Labs: 03/09/2019: ALT 24; BUN 18; Creatinine, Ser 1.18; Hemoglobin 15.1; Platelets 155.0; Potassium 4.2; Sodium 140    Lipid Panel Lab Results  Component Value Date   CHOL 119 03/09/2019   HDL 32.80 (L) 03/09/2019   LDLCALC 55 03/09/2019   TRIG 155.0 (H) 03/09/2019      Wt Readings from Last 3 Encounters:  06/01/19 171 lb (77.6 kg)  03/09/19 175 lb 8 oz (79.6 kg)  07/30/18 177 lb 8 oz (80.5 kg)     ASSESSMENT AND PLAN:   Coronary artery disease involving autologous vein coronary bypass graft without angina pectoris - Plan: EKG 12-Lead 2006 bypass surgery, Non-smoker, cholesterol at goal Stable  Essential hypertension - Plan: EKG 12-Lead Blood pressure is well controlled on today's visit. No changes made to the medications.  Hyperlipidemia LDL goal <70 Cholesterol is at goal on the current lipid regimen. No changes to the medications were  made.  Uncontrolled type 2 diabetes mellitus with other circulatory complication, without long-term current use of insulin ([place]) Numbers stable  Alcohol abuse Suggested moderating etoh intake  GERDX Denies any significant GERD symptoms   Total encounter time more than 25 minutes  Greater than 50% was spent in counseling and coordination of care with the patient  Disposition:   F/U  12 months   No orders of the defined types were placed in this encounter.    Signed, Esmond Plants, M.D., Ph.D. 06/01/2019  [place], [place]

## 2019-06-01 ENCOUNTER — Encounter: Payer: Self-pay | Admitting: Cardiovascular Disease

## 2019-06-01 ENCOUNTER — Other Ambulatory Visit: Payer: Self-pay

## 2019-06-01 ENCOUNTER — Ambulatory Visit (INDEPENDENT_AMBULATORY_CARE_PROVIDER_SITE_OTHER): Payer: Medicare HMO | Admitting: Cardiovascular Disease

## 2019-06-01 VITALS — BP 150/70 | HR 58 | Temp 96.4°F | Ht 67.5 in | Wt 171.0 lb

## 2019-06-01 DIAGNOSIS — E1165 Type 2 diabetes mellitus with hyperglycemia: Secondary | ICD-10-CM

## 2019-06-01 DIAGNOSIS — I10 Essential (primary) hypertension: Secondary | ICD-10-CM

## 2019-06-01 DIAGNOSIS — F1010 Alcohol abuse, uncomplicated: Secondary | ICD-10-CM

## 2019-06-01 DIAGNOSIS — E785 Hyperlipidemia, unspecified: Secondary | ICD-10-CM | POA: Diagnosis not present

## 2019-06-01 DIAGNOSIS — E1159 Type 2 diabetes mellitus with other circulatory complications: Secondary | ICD-10-CM | POA: Diagnosis not present

## 2019-06-01 DIAGNOSIS — I25118 Atherosclerotic heart disease of native coronary artery with other forms of angina pectoris: Secondary | ICD-10-CM

## 2019-06-01 NOTE — Patient Instructions (Signed)

## 2019-06-17 ENCOUNTER — Encounter: Payer: Self-pay | Admitting: Family Medicine

## 2019-06-17 ENCOUNTER — Ambulatory Visit (INDEPENDENT_AMBULATORY_CARE_PROVIDER_SITE_OTHER): Payer: Medicare HMO | Admitting: Family Medicine

## 2019-06-17 ENCOUNTER — Other Ambulatory Visit: Payer: Self-pay

## 2019-06-17 VITALS — BP 140/72 | HR 61 | Temp 98.2°F | Ht 67.5 in | Wt 170.0 lb

## 2019-06-17 DIAGNOSIS — R109 Unspecified abdominal pain: Secondary | ICD-10-CM | POA: Diagnosis not present

## 2019-06-17 LAB — POC URINALSYSI DIPSTICK (AUTOMATED)
Bilirubin, UA: NEGATIVE
Blood, UA: NEGATIVE
Glucose, UA: NEGATIVE
Ketones, UA: NEGATIVE
Leukocytes, UA: NEGATIVE
Nitrite, UA: NEGATIVE
Protein, UA: NEGATIVE
Spec Grav, UA: 1.01 (ref 1.010–1.025)
Urobilinogen, UA: 0.2 E.U./dL
pH, UA: 6 (ref 5.0–8.0)

## 2019-06-17 MED ORDER — CEFIXIME 400 MG PO CAPS: 400.0000 mg | ORAL_CAPSULE | Freq: Every day | ORAL | 0 refills | Status: DC

## 2019-06-17 NOTE — Progress Notes (Signed)
[name] T. Gregory Goens, MD [facility] and [place] at [facility] [place], [address] Phone: [phone]  FAX: [phone]  [name] - 73 y.o. male  MRN [id number]  Date of Birth: 1946/05/09  Visit Date: 06/17/2019  PCP: [name], MD  Referred by: [name], MD  Chief Complaint  Patient presents with  . Flank Pain    Right   Subjective:   [name] is a 73 y.o. very pleasant male patient who presents with the following:  R sided flank pain.   ? Kidk\ney infection.  None blood.   [name] is doing well.  He presents today with some acute right-sided flank pain.  He is not had any fever, and he is not had any trouble urinating or moving his bowels.  He denies any abdominal pain, no chest pain.  He is eating and drinking okay.  He is not noticed any blood in his urine.  He does not have a history of kidney stones.  Past Medical History, Surgical History, Social History, Family History, Problem List, Medications, and Allergies have been reviewed and updated if relevant.  Patient Active Problem List   Diagnosis Date Noted  . CAD (coronary artery disease), autologous vein bypass graft 10/11/2011    Priority: High  . Diabetes type 2, uncontrolled ([place]) 04/13/2009    Priority: High  . HTN (hypertension)     Priority: Medium  . Hyperlipidemia LDL goal <70 05/13/2008    Priority: Medium  . CAD (coronary artery disease), native coronary artery 05/25/2018  . Alcohol abuse 08/10/2010  . History of colonic polyps 03/12/2007  . DEPRESSION 12/03/2006    Past Medical History:  Diagnosis Date  . Alcohol abuse, unspecified   . CAD (coronary artery disease) 9/06   CABG x4  . Colon polyps   . Depression   . DMII (diabetes mellitus, type 2) ([place]) 10/03  . HLD (hyperlipidemia) 10/03  . HTN (hypertension) 10/03    Past Surgical History:  Procedure Laterality Date  . COLONOSCOPY    . CORONARY  ARTERY BYPASS GRAFT  9/06   x4  . ESOPHAGOGASTRODUODENOSCOPY  7/07   polyp; mucosal abnml  . TONSILLECTOMY      Social History   Socioeconomic History  . Marital status: Significant Other    Spouse name: Not on file  . Number of children: Not on file  . Years of education: Not on file  . Highest education level: Not on file  Occupational History  . Not on file  Social Needs  . Financial resource strain: Not on file  . Food insecurity    Worry: Not on file    Inability: Not on file  . Transportation needs    Medical: Not on file    Non-medical: Not on file  Tobacco Use  . Smoking status: Former Research scientist (life sciences)  . Smokeless tobacco: Never Used  . Tobacco comment: quit 41 years ago  Substance and Sexual Activity  . Alcohol use: Yes    Alcohol/week: 17.0 standard drinks    Types: 15 Shots of liquor, 2 Glasses of wine per week    Comment: Regular  . Drug use: No  . Sexual activity: Not on file  Lifestyle  . Physical activity    Days per week: Not on file    Minutes per session: Not on file  . Stress: Not on file  Relationships  . Social Herbalist on  phone: Not on file    Gets together: Not on file    Attends religious service: Not on file    Active member of club or organization: Not on file    Attends meetings of clubs or organizations: Not on file    Relationship status: Not on file  . Intimate partner violence    Fear of current or ex partner: Not on file    Emotionally abused: Not on file    Physically abused: Not on file    Forced sexual activity: Not on file  Other Topics Concern  . Not on file  Social History Narrative   Wife Eustaquio Maize is deceased   He is retired.    Enjoys firearms and recreational shooting.     Family History  Problem Relation Age of Onset  . Alcohol abuse Father   . Stroke Father   . Other Father        cardiac problems  . Heart disease Father   . COPD Mother   . Alcohol abuse Brother   . Cirrhosis Brother        EtOH  .  Anxiety disorder Brother   . Heart disease Paternal Grandmother   . Colon cancer Neg Hx   . Esophageal cancer Neg Hx   . Rectal cancer Neg Hx   . Stomach cancer Neg Hx     Allergies  Allergen Reactions  . Sulfonamide Derivatives     REACTION: unknown reaction    Medication list reviewed and updated in full in [facility].  ROS: GEN: Acute illness details above GI: Tolerating PO intake GU: maintaining adequate hydration and urination Pulm: No SOB Interactive and getting along well at home.  Otherwise, ROS is as per the HPI.  Objective:   BP 140/72   Pulse 61   Temp 98.2 F (36.8 C) (Temporal)   Ht 5' 7.5" (1.715 m)   Wt 170 lb (77.1 kg)   SpO2 97%   BMI 26.23 kg/m    GEN: WDWN, NAD, Non-toxic, A & O x 3 HEENT: Atraumatic, Normocephalic. Neck supple. No masses, No LAD. Ears and Nose: No external deformity. CV: RRR, No M/G/R. No JVD. No thrill. No extra heart sounds. PULM: CTA B, no wheezes, crackles, rhonchi. No retractions. No resp. distress. No accessory muscle use. No CVA tenderness is present today.  Nontender throughout the abdomen.  The patient does not have any muscular low back pain from L4-S1 bilaterally. EXTR: No c/c/e NEURO Normal gait.  PSYCH: Normally interactive. Conversant. Not depressed or anxious appearing.  Calm demeanor.     Laboratory and Imaging Data:  Assessment and Plan:     ICD-10-CM   1. Acute right flank pain  R10.9    Etiology most likely pyelonephritis early, but cannot exclude nephrolithiasis.  Following risk Cipro, Levaquin, etc. I am going to place the patient on Suprax.  He is going to bring back a urine when he can today  Follow-up: No follow-ups on file.  Meds ordered this encounter  Medications  . cefixime (SUPRAX) 400 MG CAPS capsule    Sig: Take 1 capsule (400 mg total) by mouth daily.    Dispense:  14 capsule    Refill:  0   No orders of the defined types were placed in this encounter.   Signed,   Maud Deed. Sheldon Sem, MD   Outpatient Encounter Medications as of 06/17/2019  Medication Sig  . ACCU-CHEK SOFTCLIX LANCETS lancets Check blood sugar once daily and as  instructed. Dx E11.65  . amLODipine (NORVASC) 10 MG tablet TAKE 1 TABLET BY MOUTH ONCE DAILY  . aspirin 81 MG tablet Take 1 tablet (81 mg total) by mouth daily.  Marland Kitchen atorvastatin (LIPITOR) 80 MG tablet Take 80 mg by mouth daily.  . Blood Glucose Monitoring Suppl (ACCU-CHEK AVIVA PLUS) w/Device KIT Check blood sugar once daily and as instructed. Dx E11.65  . calcium carbonate (TUMS - DOSED IN MG ELEMENTAL CALCIUM) 500 MG chewable tablet Chew 1 tablet by mouth daily as needed for indigestion or heartburn.  . fish oil-omega-3 fatty acids 1000 MG capsule Take 2 g by mouth daily.    Marland Kitchen glucose blood (ACCU-CHEK AVIVA PLUS) test strip Check blood sugar once daily and as instructed. Dx E11.65  . losartan (COZAAR) 100 MG tablet TAKE 1 TABLET(100 MG) BY MOUTH DAILY  . metFORMIN (GLUCOPHAGE-XR) 500 MG 24 hr tablet TAKE 4 TABLETS BY MOUTH EVERY DAY WITH BREAKFAST  . metoprolol tartrate (LOPRESSOR) 50 MG tablet Take 1 tablet (50 mg total) by mouth 2 (two) times daily.  . Multiple Vitamin (MULTIVITAMIN) tablet Take 1 tablet by mouth daily.    . sildenafil (REVATIO) 20 MG tablet Generic Revatio / Sildanefil 20 mg. 2 - 5 tabs 30 mins prior to intercourse.  . [DISCONTINUED] atorvastatin (LIPITOR) 80 MG tablet Take one tablet by mouth M,W,F and 1/2 tablet T,TH, Sat, Sun (Patient taking differently: Take 80 mg by mouth daily at 6 PM. )  . cefixime (SUPRAX) 400 MG CAPS capsule Take 1 capsule (400 mg total) by mouth daily.   No facility-administered encounter medications on file as of 06/17/2019.

## 2019-06-17 NOTE — Addendum Note (Signed)
Addended by: Carter Kitten on: 06/17/2019 02:13 PM   Modules accepted: Orders

## 2019-06-17 NOTE — Addendum Note (Signed)
Addended by: Carter Kitten on: 06/17/2019 02:18 PM   Modules accepted: Orders

## 2019-06-18 LAB — URINALYSIS, MICROSCOPIC ONLY: RBC / HPF: NONE SEEN (ref 0–?)

## 2019-06-19 LAB — URINE CULTURE
MICRO NUMBER:: 1040245
SPECIMEN QUALITY:: ADEQUATE

## 2019-07-27 ENCOUNTER — Ambulatory Visit: Payer: Medicare HMO

## 2019-07-27 ENCOUNTER — Other Ambulatory Visit: Payer: Self-pay

## 2019-07-27 ENCOUNTER — Ambulatory Visit (INDEPENDENT_AMBULATORY_CARE_PROVIDER_SITE_OTHER): Payer: Medicare HMO

## 2019-07-27 DIAGNOSIS — Z0000 Encounter for general adult medical examination without abnormal findings: Secondary | ICD-10-CM

## 2019-07-27 NOTE — Patient Instructions (Signed)
[name] , Thank you for taking time to come for your Medicare Wellness Visit. I appreciate your ongoing commitment to your health goals. Please review the following plan we discussed and let me know if I can assist you in the future.   Screening recommendations/referrals: Colonoscopy: Cologuard completed 08/29/2018 Recommended yearly ophthalmology/optometry visit for glaucoma screening and checkup Recommended yearly dental visit for hygiene and checkup  Vaccinations: Influenza vaccine: will get at physical Pneumococcal vaccine: Completed series Tdap vaccine: decline Shingles vaccine: will check with insurance    Advanced directives: Please bring a copy of your POA (Power of Attorney) and/or Living Will to your next appointment.   Conditions/risks identified: diabetes, hypertension  Next appointment: 08/03/2019 @ 10:20 am   Preventive Care 65 Years and Older, Male Preventive care refers to lifestyle choices and visits with your health care provider that can promote health and wellness. What does preventive care include?  A yearly physical exam. This is also called an annual well check.  Dental exams once or twice a year.  Routine eye exams. Ask your health care provider how often you should have your eyes checked.  Personal lifestyle choices, including:  Daily care of your teeth and gums.  Regular physical activity.  Eating a healthy diet.  Avoiding tobacco and drug use.  Limiting alcohol use.  Practicing safe sex.  Taking low doses of aspirin every day.  Taking vitamin and mineral supplements as recommended by your health care provider. What happens during an annual well check? The services and screenings done by your health care provider during your annual well check will depend on your age, overall health, lifestyle risk factors, and family history of disease. Counseling  Your health care provider may ask you questions about your:  Alcohol use.  Tobacco use.   Drug use.  Emotional well-being.  Home and relationship well-being.  Sexual activity.  Eating habits.  History of falls.  Memory and ability to understand (cognition).  Work and work Statistician. Screening  You may have the following tests or measurements:  Height, weight, and BMI.  Blood pressure.  Lipid and cholesterol levels. These may be checked every 5 years, or more frequently if you are over 64 years old.  Skin check.  Lung cancer screening. You may have this screening every year starting at age 22 if you have a 30-pack-year history of smoking and currently smoke or have quit within the past 15 years.  Fecal occult blood test (FOBT) of the stool. You may have this test every year starting at age 1.  Flexible sigmoidoscopy or colonoscopy. You may have a sigmoidoscopy every 5 years or a colonoscopy every 10 years starting at age 80.  Prostate cancer screening. Recommendations will vary depending on your family history and other risks.  Hepatitis C blood test.  Hepatitis B blood test.  Sexually transmitted disease (STD) testing.  Diabetes screening. This is done by checking your blood sugar (glucose) after you have not eaten for a while (fasting). You may have this done every 1-3 years.  Abdominal aortic aneurysm (AAA) screening. You may need this if you are a current or former smoker.  Osteoporosis. You may be screened starting at age 29 if you are at high risk. Talk with your health care provider about your test results, treatment options, and if necessary, the need for more tests. Vaccines  Your health care provider may recommend certain vaccines, such as:  Influenza vaccine. This is recommended every year.  Tetanus, diphtheria, and acellular  pertussis (Tdap, Td) vaccine. You may need a Td booster every 10 years.  Zoster vaccine. You may need this after age 28.  Pneumococcal 13-valent conjugate (PCV13) vaccine. One dose is recommended after age 46.   Pneumococcal polysaccharide (PPSV23) vaccine. One dose is recommended after age 36. Talk to your health care provider about which screenings and vaccines you need and how often you need them. This information is not intended to replace advice given to you by your health care provider. Make sure you discuss any questions you have with your health care provider. Document Released: 09/02/2015 Document Revised: 04/25/2016 Document Reviewed: 06/07/2015 Elsevier Interactive Patient Education  2017 [place] Prevention in the Home Falls can cause injuries. They can happen to people of all ages. There are many things you can do to make your home safe and to help prevent falls. What can I do on the outside of my home?  Regularly fix the edges of walkways and driveways and fix any cracks.  Remove anything that might make you trip as you walk through a door, such as a raised step or threshold.  Trim any bushes or trees on the path to your home.  Use bright outdoor lighting.  Clear any walking paths of anything that might make someone trip, such as rocks or tools.  Regularly check to see if handrails are loose or broken. Make sure that both sides of any steps have handrails.  Any raised decks and porches should have guardrails on the edges.  Have any leaves, snow, or ice cleared regularly.  Use sand or salt on walking paths during winter.  Clean up any spills in your garage right away. This includes oil or grease spills. What can I do in the bathroom?  Use night lights.  Install grab bars by the toilet and in the tub and shower. Do not use towel bars as grab bars.  Use non-skid mats or decals in the tub or shower.  If you need to sit down in the shower, use a plastic, non-slip stool.  Keep the floor dry. Clean up any water that spills on the floor as soon as it happens.  Remove soap buildup in the tub or shower regularly.  Attach bath mats securely with double-sided non-slip  rug tape.  Do not have throw rugs and other things on the floor that can make you trip. What can I do in the bedroom?  Use night lights.  Make sure that you have a light by your bed that is easy to reach.  Do not use any sheets or blankets that are too big for your bed. They should not hang down onto the floor.  Have a firm chair that has side arms. You can use this for support while you get dressed.  Do not have throw rugs and other things on the floor that can make you trip. What can I do in the kitchen?  Clean up any spills right away.  Avoid walking on wet floors.  Keep items that you use a lot in easy-to-reach places.  If you need to reach something above you, use a strong step stool that has a grab bar.  Keep electrical cords out of the way.  Do not use floor polish or wax that makes floors slippery. If you must use wax, use non-skid floor wax.  Do not have throw rugs and other things on the floor that can make you trip. What can I do with my stairs?  Do not leave any items on the stairs.  Make sure that there are handrails on both sides of the stairs and use them. Fix handrails that are broken or loose. Make sure that handrails are as long as the stairways.  Check any carpeting to make sure that it is firmly attached to the stairs. Fix any carpet that is loose or worn.  Avoid having throw rugs at the top or bottom of the stairs. If you do have throw rugs, attach them to the floor with carpet tape.  Make sure that you have a light switch at the top of the stairs and the bottom of the stairs. If you do not have them, ask someone to add them for you. What else can I do to help prevent falls?  Wear shoes that:  Do not have high heels.  Have rubber bottoms.  Are comfortable and fit you well.  Are closed at the toe. Do not wear sandals.  If you use a stepladder:  Make sure that it is fully opened. Do not climb a closed stepladder.  Make sure that both sides of  the stepladder are locked into place.  Ask someone to hold it for you, if possible.  Clearly mark and make sure that you can see:  Any grab bars or handrails.  First and last steps.  Where the edge of each step is.  Use tools that help you move around (mobility aids) if they are needed. These include:  Canes.  Walkers.  Scooters.  Crutches.  Turn on the lights when you go into a dark area. Replace any light bulbs as soon as they burn out.  Set up your furniture so you have a clear path. Avoid moving your furniture around.  If any of your floors are uneven, fix them.  If there are any pets around you, be aware of where they are.  Review your medicines with your doctor. Some medicines can make you feel dizzy. This can increase your chance of falling. Ask your doctor what other things that you can do to help prevent falls. This information is not intended to replace advice given to you by your health care provider. Make sure you discuss any questions you have with your health care provider. Document Released: 06/02/2009 Document Revised: 01/12/2016 Document Reviewed: 09/10/2014 Elsevier Interactive Patient Education  2017 [employer].

## 2019-07-27 NOTE — Progress Notes (Signed)
PCP notes:  Health Maintenance: Needs flu vaccine Will check with insurance regarding shingrix  Abnormal Screenings: none   Patient concerns: Patient complains of ongoing right shoulder pain.    Nurse concerns: none   Next PCP appt.: 08/03/2019 @ 10:20 am

## 2019-07-27 NOTE — Progress Notes (Addendum)
Subjective:   [name] is a 73 y.o. male who presents for Medicare Annual/Subsequent preventive examination.  Review of Systems: N/A   This visit is being conducted through telemedicine via telephone at the nurse health advisor's home address due to the COVID-19 pandemic. This patient has given me verbal consent via doximity to conduct this visit, patient states they are participating from their home address. Patient and myself are on the telephone call. There is no referral for this visit. Some vital signs may be absent or patient reported.    Patient identification: identified by name, DOB, and current address   Cardiac Risk Factors include: advanced age (>47mn, >>71women);diabetes mellitus;male gender;hypertension     Objective:    Vitals: There were no vitals taken for this visit.  There is no height or weight on file to calculate BMI.  Advanced Directives 07/27/2019 07/22/2018 07/05/2014  Does Patient Have a Medical Advance Directive? Yes No Yes  Type of AParamedicof [place]ow[place]Living will - [place] Point[place] will  Copy of HNellistonin Chart? No - copy requested - -  Would patient like information on creating a medical advance directive? - No - Patient declined -    Tobacco Social History   Tobacco Use  Smoking Status Former Smoker  Smokeless Tobacco Never Used  Tobacco Comment   quit 41 years ago     Counseling given: Not Answered Comment: quit 41 years ago   Clinical Intake:  Pre-visit preparation completed: Yes  Pain : 0-10 Pain Score: 5  Pain Type: Chronic pain Pain Location: Shoulder Pain Orientation: Right Pain Descriptors / Indicators: Aching Pain Onset: More than a month ago Pain Frequency: Intermittent     Nutritional Risks: None Diabetes: Yes CBG done?: No Did pt. bring in CBG monitor from home?: No  How often do you need to have someone help you when you read instructions,  pamphlets, or other written materials from your doctor or pharmacy?: 1 - Never What is the last grade level you completed in school?: 2 year degree  Interpreter Needed?: No  Information entered by :: CJohnson, LPN  Past Medical History:  Diagnosis Date  . Alcohol abuse, unspecified   . CAD (coronary artery disease) 9/06   CABG x4  . Colon polyps   . Depression   . DMII (diabetes mellitus, type 2) ([place] 10/03  . HLD (hyperlipidemia) 10/03  . HTN (hypertension) 10/03   Past Surgical History:  Procedure Laterality Date  . COLONOSCOPY    . CORONARY ARTERY BYPASS GRAFT  9/06   x4  . ESOPHAGOGASTRODUODENOSCOPY  7/07   polyp; mucosal abnml  . TONSILLECTOMY     Family History  Problem Relation Age of Onset  . Alcohol abuse Father   . Stroke Father   . Other Father        cardiac problems  . Heart disease Father   . COPD Mother   . Alcohol abuse Brother   . Cirrhosis Brother        EtOH  . Anxiety disorder Brother   . Heart disease Paternal Grandmother   . Colon cancer Neg Hx   . Esophageal cancer Neg Hx   . Rectal cancer Neg Hx   . Stomach cancer Neg Hx    Social History   Socioeconomic History  . Marital status: Significant Other    Spouse name: Not on file  . Number of children: Not on file  . Years of  education: Not on file  . Highest education level: Not on file  Occupational History  . Not on file  Social Needs  . Financial resource strain: Not hard at all  . Food insecurity    Worry: Never true    Inability: Never true  . Transportation needs    Medical: No    Non-medical: No  Tobacco Use  . Smoking status: Former Research scientist (life sciences)  . Smokeless tobacco: Never Used  . Tobacco comment: quit 41 years ago  Substance and Sexual Activity  . Alcohol use: Yes    Alcohol/week: 17.0 standard drinks    Types: 15 Shots of liquor, 2 Glasses of wine per week    Comment: Regular  . Drug use: No  . Sexual activity: Not on file  Lifestyle  . Physical activity    Days per  week: 7 days    Minutes per session: 60 min  . Stress: To some extent  Relationships  . Social Herbalist on phone: Not on file    Gets together: Not on file    Attends religious service: Not on file    Active member of club or organization: Not on file    Attends meetings of clubs or organizations: Not on file    Relationship status: Not on file  Other Topics Concern  . Not on file  Social History Narrative   Wife Eustaquio Maize is deceased   He is retired.    Enjoys firearms and recreational shooting.     Outpatient Encounter Medications as of 07/27/2019  Medication Sig  . ACCU-CHEK SOFTCLIX LANCETS lancets Check blood sugar once daily and as instructed. Dx E11.65  . amLODipine (NORVASC) 10 MG tablet TAKE 1 TABLET BY MOUTH ONCE DAILY  . aspirin 81 MG tablet Take 1 tablet (81 mg total) by mouth daily.  Marland Kitchen atorvastatin (LIPITOR) 80 MG tablet Take 80 mg by mouth daily.  . Blood Glucose Monitoring Suppl (ACCU-CHEK AVIVA PLUS) w/Device KIT Check blood sugar once daily and as instructed. Dx E11.65  . calcium carbonate (TUMS - DOSED IN MG ELEMENTAL CALCIUM) 500 MG chewable tablet Chew 1 tablet by mouth daily as needed for indigestion or heartburn.  . cefixime (SUPRAX) 400 MG CAPS capsule Take 1 capsule (400 mg total) by mouth daily.  . fish oil-omega-3 fatty acids 1000 MG capsule Take 2 g by mouth daily.    Marland Kitchen glucose blood (ACCU-CHEK AVIVA PLUS) test strip Check blood sugar once daily and as instructed. Dx E11.65  . losartan (COZAAR) 100 MG tablet TAKE 1 TABLET(100 MG) BY MOUTH DAILY  . metFORMIN (GLUCOPHAGE-XR) 500 MG 24 hr tablet TAKE 4 TABLETS BY MOUTH EVERY DAY WITH BREAKFAST  . metoprolol tartrate (LOPRESSOR) 50 MG tablet Take 1 tablet (50 mg total) by mouth 2 (two) times daily.  . Multiple Vitamin (MULTIVITAMIN) tablet Take 1 tablet by mouth daily.    . sildenafil (REVATIO) 20 MG tablet Generic Revatio / Sildanefil 20 mg. 2 - 5 tabs 30 mins prior to intercourse. (Patient not taking:  Reported on 07/27/2019)   No facility-administered encounter medications on file as of 07/27/2019.     Activities of Daily Living In your present state of health, do you have any difficulty performing the following activities: 07/27/2019  Hearing? N  Vision? N  Difficulty concentrating or making decisions? N  Walking or climbing stairs? N  Dressing or bathing? N  Doing errands, shopping? N  Preparing Food and eating ? N  Using the Toilet?  N  In the past six months, have you accidently leaked urine? Y  Comment some leakage at times  Do you have problems with loss of bowel control? N  Managing your Medications? N  Managing your Finances? N  Housekeeping or managing your Housekeeping? N  Some recent data might be hidden    Patient Care Team: [name], MD as PCP - General Rockey Situ, Kathlene November, MD as PCP - Cardiology (Cardiology) Minna Merritts, MD as Consulting Physician (Cardiology)   Assessment:   This is a routine wellness examination for [facility]  Exercise Activities and Dietary recommendations Current Exercise Habits: Home exercise routine, Type of exercise: walking, Time (Minutes): 60, Frequency (Times/Week): 7, Weekly Exercise (Minutes/Week): 420, Intensity: Moderate, Exercise limited by: None identified  Goals    . Patient Stated     Starting 07/21/2018, I will continue to take medications as prescribed.     . Patient Stated     07/27/2019, I will maintain and continue medications as prescribed.       Fall Risk Fall Risk  07/27/2019 07/21/2018 06/13/2017 06/06/2016 05/30/2015  Falls in the past year? 0 0 No No No  Number falls in past yr: 0 - - - -  Injury with Fall? 0 - - - -  Risk for fall due to : Medication side effect - - - -  Follow up Falls evaluation completed;Falls prevention discussed - - - -   Is the patient's home free of loose throw rugs in walkways, pet beds, electrical cords, etc?   yes      Grab bars in the bathroom? no      Handrails on the  stairs?   no      Adequate lighting?   yes  Timed Get Up and Go Performed: N/A  Depression Screen PHQ 2/9 Scores 07/27/2019 07/21/2018 06/13/2017 06/06/2016  PHQ - 2 Score 2 0 0 0  PHQ- 9 Score 2 0 - -    Cognitive Function MMSE - Mini Mental State Exam 07/27/2019 07/21/2018  Orientation to time 5 5  Orientation to Place 5 5  Registration 3 3  Attention/ Calculation 3 0  Recall 3 1  Recall-comments - unable to recall 2 of 3 words  Language- name 2 objects - 0  Language- repeat 1 1  Language- follow 3 step command - 3  Language- read & follow direction - 0  Write a sentence - 0  Copy design - 0  Total score - 18  Mini Cog  Mini-Cog screen was completed. Maximum score is 22. A value of 0 denotes this part of the MMSE was not completed or the patient failed this part of the Mini-Cog screening.       Immunization History  Administered Date(s) Administered  . Influenza Split 08/02/2011, 06/11/2012  . Influenza Whole 08/10/2010  . Influenza, High Dose Seasonal PF 05/18/2014  . Influenza,inj,Quad PF,6+ Mos 05/25/2013, 05/30/2015, 06/13/2017  . Influenza-Unspecified 05/16/2016  . Pneumococcal Conjugate-13 05/30/2015  . Pneumococcal Polysaccharide-23 10/11/2011  . Td 08/20/1993, 04/12/2009    Qualifies for Shingles Vaccine? Yes  Screening Tests Health Maintenance  Topic Date Due  . FOOT EXAM  05/30/2016  . INFLUENZA VACCINE  03/21/2019  . TETANUS/TDAP  07/26/2020 (Originally 04/13/2019)  . HEMOGLOBIN A1C  09/09/2019  . OPHTHALMOLOGY EXAM  09/19/2019  . Fecal DNA (Cologuard)  08/29/2021  . Hepatitis C Screening  Completed  . PNA vac Low Risk Adult  Completed   Cancer Screenings: Lung: Low Dose CT Chest  recommended if Age 31-80 years, 67 pack-year currently smoking OR have quit w/in 15years. Patient does not qualify. Colorectal: Cologuard completed 08/29/2018  Additional Screenings:  Hepatitis C Screening: 06/04/2016      Plan:    Patient states that he will  maintain and continue medications as prescribed.   I have personally reviewed and noted the following in the patient's chart:   . Medical and social history . Use of alcohol, tobacco or illicit drugs  . Current medications and supplements . Functional ability and status . Nutritional status . Physical activity . Advanced directives . List of other physicians . Hospitalizations, surgeries, and ER visits in previous 12 months . Vitals . Screenings to include cognitive, depression, and falls . Referrals and appointments  In addition, I have reviewed and discussed with patient certain preventive protocols, quality metrics, and best practice recommendations. A written personalized care plan for preventive services as well as general preventive health recommendations were provided to patient.     Andrez Grime, LPN  [id number]

## 2019-07-29 ENCOUNTER — Other Ambulatory Visit (INDEPENDENT_AMBULATORY_CARE_PROVIDER_SITE_OTHER): Payer: Medicare HMO

## 2019-07-29 ENCOUNTER — Other Ambulatory Visit: Payer: Self-pay | Admitting: Family Medicine

## 2019-07-29 ENCOUNTER — Other Ambulatory Visit: Payer: Self-pay

## 2019-07-29 DIAGNOSIS — Z79899 Other long term (current) drug therapy: Secondary | ICD-10-CM | POA: Diagnosis not present

## 2019-07-29 DIAGNOSIS — E1165 Type 2 diabetes mellitus with hyperglycemia: Secondary | ICD-10-CM

## 2019-07-29 DIAGNOSIS — N400 Enlarged prostate without lower urinary tract symptoms: Secondary | ICD-10-CM

## 2019-07-29 DIAGNOSIS — E785 Hyperlipidemia, unspecified: Secondary | ICD-10-CM

## 2019-07-29 DIAGNOSIS — E118 Type 2 diabetes mellitus with unspecified complications: Secondary | ICD-10-CM

## 2019-07-29 LAB — MICROALBUMIN / CREATININE URINE RATIO
Creatinine,U: 78.2 mg/dL
Microalb Creat Ratio: 2 mg/g (ref 0.0–30.0)
Microalb, Ur: 1.6 mg/dL (ref 0.0–1.9)

## 2019-07-29 LAB — CBC WITH DIFFERENTIAL/PLATELET
Basophils Absolute: 0.1 10*3/uL (ref 0.0–0.1)
Basophils Relative: 1.3 % (ref 0.0–3.0)
Eosinophils Absolute: 0.2 10*3/uL (ref 0.0–0.7)
Eosinophils Relative: 5.8 % — ABNORMAL HIGH (ref 0.0–5.0)
HCT: 42.4 % (ref 39.0–52.0)
Hemoglobin: 14.7 g/dL (ref 13.0–17.0)
Lymphocytes Relative: 34.3 % (ref 12.0–46.0)
Lymphs Abs: 1.5 10*3/uL (ref 0.7–4.0)
MCHC: 34.6 g/dL (ref 30.0–36.0)
MCV: 94.4 fl (ref 78.0–100.0)
Monocytes Absolute: 0.5 10*3/uL (ref 0.1–1.0)
Monocytes Relative: 11.6 % (ref 3.0–12.0)
Neutro Abs: 2 10*3/uL (ref 1.4–7.7)
Neutrophils Relative %: 47 % (ref 43.0–77.0)
Platelets: 154 10*3/uL (ref 150.0–400.0)
RBC: 4.5 Mil/uL (ref 4.22–5.81)
RDW: 13.1 % (ref 11.5–15.5)
WBC: 4.3 10*3/uL (ref 4.0–10.5)

## 2019-07-29 LAB — BASIC METABOLIC PANEL
BUN: 17 mg/dL (ref 6–23)
CO2: 30 mEq/L (ref 19–32)
Calcium: 9.9 mg/dL (ref 8.4–10.5)
Chloride: 102 mEq/L (ref 96–112)
Creatinine, Ser: 1.12 mg/dL (ref 0.40–1.50)
GFR: 64.23 mL/min (ref >60.00)
Glucose, Bld: 148 mg/dL — ABNORMAL HIGH (ref 70–99)
Potassium: 4.1 mEq/L (ref 3.5–5.1)
Sodium: 139 mEq/L (ref 135–145)

## 2019-07-29 LAB — HEPATIC FUNCTION PANEL
ALT: 32 U/L (ref 0–53)
AST: 23 U/L (ref 0–37)
Albumin: 4.7 g/dL (ref 3.5–5.2)
Alkaline Phosphatase: 40 U/L (ref 39–117)
Bilirubin, Direct: 0.2 mg/dL (ref 0.0–0.3)
Total Bilirubin: 1.1 mg/dL (ref 0.2–1.2)
Total Protein: 7.4 g/dL (ref 6.0–8.3)

## 2019-07-29 LAB — LIPID PANEL
Cholesterol: 131 mg/dL (ref 0–200)
HDL: 39.1 mg/dL (ref >39.00)
LDL Cholesterol: 56 mg/dL (ref 0–99)
NonHDL: 91.58
Total CHOL/HDL Ratio: 3
Triglycerides: 179 mg/dL — ABNORMAL HIGH (ref 0.0–149.0)
VLDL: 35.8 mg/dL (ref 0.0–40.0)

## 2019-07-29 LAB — HEMOGLOBIN A1C: Hgb A1c MFr Bld: 6.7 % — ABNORMAL HIGH (ref 4.6–6.5)

## 2019-07-30 LAB — PSA, TOTAL WITH REFLEX TO PSA, FREE: PSA, Total: 1.2 ng/mL (ref <=4.0)

## 2019-08-03 ENCOUNTER — Encounter: Payer: Self-pay | Admitting: Family Medicine

## 2019-08-03 ENCOUNTER — Other Ambulatory Visit: Payer: Self-pay

## 2019-08-03 ENCOUNTER — Ambulatory Visit (INDEPENDENT_AMBULATORY_CARE_PROVIDER_SITE_OTHER): Payer: Medicare HMO | Admitting: Family Medicine

## 2019-08-03 VITALS — BP 154/86 | HR 65 | Temp 98.4°F | Ht 66.5 in | Wt 169.2 lb

## 2019-08-03 DIAGNOSIS — E1159 Type 2 diabetes mellitus with other circulatory complications: Secondary | ICD-10-CM | POA: Diagnosis not present

## 2019-08-03 DIAGNOSIS — I10 Essential (primary) hypertension: Secondary | ICD-10-CM

## 2019-08-03 DIAGNOSIS — E785 Hyperlipidemia, unspecified: Secondary | ICD-10-CM

## 2019-08-03 DIAGNOSIS — F1010 Alcohol abuse, uncomplicated: Secondary | ICD-10-CM

## 2019-08-03 DIAGNOSIS — E1165 Type 2 diabetes mellitus with hyperglycemia: Secondary | ICD-10-CM | POA: Diagnosis not present

## 2019-08-03 DIAGNOSIS — I25810 Atherosclerosis of coronary artery bypass graft(s) without angina pectoris: Secondary | ICD-10-CM

## 2019-08-03 MED ORDER — ATORVASTATIN CALCIUM 80 MG PO TABS: 80.0000 mg | ORAL_TABLET | Freq: Every day | ORAL | 3 refills | Status: DC

## 2019-08-03 MED ORDER — TRIAMCINOLONE ACETONIDE 0.1 % EX CREA: 1.0000 "application " | TOPICAL_CREAM | Freq: Two times a day (BID) | CUTANEOUS | 0 refills | Status: AC

## 2019-08-03 NOTE — Progress Notes (Signed)
Gregory T. Copland, MD Primary Care and [place] at [facility] [place], [address] Phone: [phone]  FAX: [phone]  [name] - 73 y.o. male  MRN [id number]  Date of Birth: 01/20/1946  Visit Date: 08/03/2019  PCP: Gregory Loffler, MD  Referred by: Gregory Loffler, MD  Chief Complaint  Patient presents with  . Annual Exam    Part 2    This visit occurred during the SARS-CoV-2 public health emergency.  Safety protocols were in place, including screening questions prior to the visit, additional usage of staff PPE, and extensive cleaning of exam room while observing appropriate contact time as indicated for disinfecting solutions.   Subjective:   [name] is a 73 y.o. very pleasant male patient who presents with the following:  F/u longitudinal medical problems.  Diabetes Mellitus: Tolerating Medications: yes Compliance with diet: fair, Body mass index is 26.91 kg/m. Exercise: minimal / intermittent Avg blood sugars at home: not checking Foot problems: none Hypoglycemia: none No nausea, vomitting, blurred vision, polyuria.  Working out a lot.   Lab Results  Component Value Date   HGBA1C 6.7 (H) 07/29/2019   HGBA1C 6.6 (H) 03/09/2019   HGBA1C 6.9 (H) 07/21/2018   Lab Results  Component Value Date   MICROALBUR 1.6 07/29/2019   LDLCALC 56 07/29/2019   CREATININE 1.12 07/29/2019    Wt Readings from Last 3 Encounters:  08/03/19 169 lb 4 oz (76.8 kg)  06/17/19 170 lb (77.1 kg)  06/01/19 171 lb (77.6 kg)    HTN: Tolerating all medications without side effects Stable and at goal No CP, no sob. No HA.  130+/70's  BP Readings from Last 3 Encounters:  08/03/19 (!) 154/86  06/17/19 140/72  06/01/19 (!) 150/70   130/80 at home. Variable checking.   Basic Metabolic Panel:    Component Value Date/Time   NA 139 07/29/2019 0956   K 4.1 07/29/2019 0956   CL 102 07/29/2019 0956   CO2  30 07/29/2019 0956   BUN 17 07/29/2019 0956   CREATININE 1.12 07/29/2019 0956   GLUCOSE 148 (H) 07/29/2019 0956   CALCIUM 9.9 07/29/2019 0956    Lipids: Doing well, stable. Tolerating meds fine with no SE. Panel reviewed with patient.  Lipids:    Component Value Date/Time   CHOL 131 07/29/2019 0956   TRIG 179.0 (H) 07/29/2019 0956   HDL 39.10 07/29/2019 0956   LDLDIRECT 61.0 02/10/2018 0844   VLDL 35.8 07/29/2019 0956   CHOLHDL 3 07/29/2019 0956    Lab Results  Component Value Date   ALT 32 07/29/2019   AST 23 07/29/2019   ALKPHOS 40 07/29/2019   BILITOT 1.1 07/29/2019    Generalized joint pain  Alcohol: He does drink about 4-5 drinks of bourbon nightly.  His father was a chronic alcoholic and his brother was an alcoholic and he died at age 27 secondary alcoholism.  He has multiple other family members with alcoholism.  Past Medical History, Surgical History, Social History, Family History, Problem List, Medications, and Allergies have been reviewed and updated if relevant.  Patient Active Problem List   Diagnosis Date Noted  . CAD (coronary artery disease), autologous vein bypass graft 10/11/2011    Priority: High  . Diabetes type 2, uncontrolled ([place]) 04/13/2009    Priority: High  . HTN (hypertension)     Priority: Medium  . Hyperlipidemia LDL goal <70 05/13/2008    Priority: Medium  .  CAD (coronary artery disease), native coronary artery 05/25/2018  . Alcohol abuse 08/10/2010  . History of colonic polyps 03/12/2007  . DEPRESSION 12/03/2006    Past Medical History:  Diagnosis Date  . Alcohol abuse, unspecified   . CAD (coronary artery disease) 9/06   CABG x4  . Colon polyps   . Depression   . DMII (diabetes mellitus, type 2) ([place]) 10/03  . HLD (hyperlipidemia) 10/03  . HTN (hypertension) 10/03    Past Surgical History:  Procedure Laterality Date  . COLONOSCOPY    . CORONARY ARTERY BYPASS GRAFT  9/06   x4  . ESOPHAGOGASTRODUODENOSCOPY  7/07   polyp;  mucosal abnml  . TONSILLECTOMY      Social History   Socioeconomic History  . Marital status: Significant Other    Spouse name: Not on file  . Number of children: Not on file  . Years of education: Not on file  . Highest education level: Not on file  Occupational History  . Not on file  Tobacco Use  . Smoking status: Former Research scientist (life sciences)  . Smokeless tobacco: Never Used  . Tobacco comment: quit 41 years ago  Substance and Sexual Activity  . Alcohol use: Yes    Alcohol/week: 17.0 standard drinks    Types: 15 Shots of liquor, 2 Glasses of wine per week    Comment: Regular  . Drug use: No  . Sexual activity: Not on file  Other Topics Concern  . Not on file  Social History Narrative   Wife [name] is deceased   He is retired.    Enjoys firearms and recreational shooting.    Social Determinants of Health   Financial Resource Strain: Low Risk   . Difficulty of Paying Living Expenses: Not hard at all  Food Insecurity: No Food Insecurity  . Worried About Charity fundraiser in the Last Year: Never true  . Ran Out of Food in the Last Year: Never true  Transportation Needs: No Transportation Needs  . Lack of Transportation (Medical): No  . Lack of Transportation (Non-Medical): No  Physical Activity: Sufficiently Active  . Days of Exercise per Week: 7 days  . Minutes of Exercise per Session: 60 min  Stress: Stress Concern Present  . Feeling of Stress : To some extent  Social Connections:   . Frequency of Communication with Friends and Family: Not on file  . Frequency of Social Gatherings with Friends and Family: Not on file  . Attends Religious Services: Not on file  . Active Member of Clubs or Organizations: Not on file  . Attends Archivist Meetings: Not on file  . Marital Status: Not on file  Intimate Partner Violence: Not At Risk  . Fear of Current or Ex-Partner: No  . Emotionally Abused: No  . Physically Abused: No  . Sexually Abused: No    Family History    Problem Relation Age of Onset  . Alcohol abuse Father   . Stroke Father   . Other Father        cardiac problems  . Heart disease Father   . COPD Mother   . Alcohol abuse Brother   . Cirrhosis Brother        EtOH  . Anxiety disorder Brother   . Heart disease Paternal Grandmother   . Colon cancer Neg Hx   . Esophageal cancer Neg Hx   . Rectal cancer Neg Hx   . Stomach cancer Neg Hx     Allergies  Allergen Reactions  . Sulfonamide Derivatives     REACTION: unknown reaction    Medication list reviewed and updated in full in [facility].   GEN: No acute illnesses, no fevers, chills. GI: No n/v/d, eating normally Pulm: No SOB Interactive and getting along well at home.  Otherwise, ROS is as per the HPI.  Objective:   BP (!) 154/86   Pulse 65   Temp 98.4 F (36.9 C) (Temporal)   Ht 5' 6.5" (1.689 m)   Wt 169 lb 4 oz (76.8 kg)   SpO2 93%   BMI 26.91 kg/m   GEN: WDWN, NAD, Non-toxic, A & O x 3 HEENT: Atraumatic, Normocephalic. Neck supple. No masses, No LAD. Ears and Nose: No external deformity. CV: RRR, No M/G/R. No JVD. No thrill. No extra heart sounds. PULM: CTA B, no wheezes, crackles, rhonchi. No retractions. No resp. distress. No accessory muscle use. EXTR: No c/c/e NEURO Normal gait.  PSYCH: Normally interactive. Conversant. Not depressed or anxious appearing.  Calm demeanor.  Lower extremity bilateral nonblanching reddish punctate rash.  Laboratory and Imaging Data: Results for orders placed or performed in visit on 07/29/19  PSA, Total with Reflex to PSA, Free  Result Value Ref Range   PSA, Total 1.2 < OR = 4.0 ng/mL  Basic metabolic panel  Result Value Ref Range   Sodium 139 135 - 145 mEq/L   Potassium 4.1 3.5 - 5.1 mEq/L   Chloride 102 96 - 112 mEq/L   CO2 30 19 - 32 mEq/L   Glucose, Bld 148 (H) 70 - 99 mg/dL   BUN 17 6 - 23 mg/dL   Creatinine, Ser 1.12 0.40 - 1.50 mg/dL   GFR 64.23 >60.00 mL/min   Calcium 9.9 8.4 - 10.5 mg/dL  CBC  with Differential/Platelet  Result Value Ref Range   WBC 4.3 4.0 - 10.5 K/uL   RBC 4.50 4.22 - 5.81 Mil/uL   Hemoglobin 14.7 13.0 - 17.0 g/dL   HCT 42.4 39.0 - 52.0 %   MCV 94.4 78.0 - 100.0 fl   MCHC 34.6 30.0 - 36.0 g/dL   RDW 13.1 11.5 - 15.5 %   Platelets 154.0 150.0 - 400.0 K/uL   Neutrophils Relative % 47.0 43.0 - 77.0 %   Lymphocytes Relative 34.3 12.0 - 46.0 %   Monocytes Relative 11.6 3.0 - 12.0 %   Eosinophils Relative 5.8 (H) 0.0 - 5.0 %   Basophils Relative 1.3 0.0 - 3.0 %   Neutro Abs 2.0 1.4 - 7.7 K/uL   Lymphs Abs 1.5 0.7 - 4.0 K/uL   Monocytes Absolute 0.5 0.1 - 1.0 K/uL   Eosinophils Absolute 0.2 0.0 - 0.7 K/uL   Basophils Absolute 0.1 0.0 - 0.1 K/uL  Liver Profile  Result Value Ref Range   Total Bilirubin 1.1 0.2 - 1.2 mg/dL   Bilirubin, Direct 0.2 0.0 - 0.3 mg/dL   Alkaline Phosphatase 40 39 - 117 U/L   AST 23 0 - 37 U/L   ALT 32 0 - 53 U/L   Total Protein 7.4 6.0 - 8.3 g/dL   Albumin 4.7 3.5 - 5.2 g/dL  Urine Microalbumin w/creat. ratio  Result Value Ref Range   Microalb, Ur 1.6 0.0 - 1.9 mg/dL   Creatinine,U 78.2 mg/dL   Microalb Creat Ratio 2.0 0.0 - 30.0 mg/g  Hemoglobin A1c  Result Value Ref Range   Hgb A1c MFr Bld 6.7 (H) 4.6 - 6.5 %  Lipid Profile  Result Value Ref Range  Cholesterol 131 0 - 200 mg/dL   Triglycerides 179.0 (H) 0.0 - 149.0 mg/dL   HDL 39.10 >39.00 mg/dL   VLDL 35.8 0.0 - 40.0 mg/dL   LDL Cholesterol 56 0 - 99 mg/dL   Total CHOL/HDL Ratio 3    NonHDL 91.58      Assessment and Plan:     ICD-10-CM   1. Uncontrolled type 2 diabetes mellitus with hyperglycemia (HCC)  E11.65   2. Essential hypertension  I10   3. Hyperlipidemia LDL goal <70  E78.5   4. Coronary artery disease involving autologous vein coronary bypass graft without angina pectoris  I25.810   5. Alcohol abuse  F10.10    Chronic conditions generally doing well.  Triamcinolone for rash on legs.  This does not appear to be fungal.  We spent some significant  amount of time talking about alcohol use, and he currently is drinking about 4-5 drinks of bourbon nightly.  Longstanding use.  Recommended that he stop drinking alcohol entirely.  He does not think he can cut it back to only 2 nightly.  We talked about options including AA and I think he would also benefit from Al-Anon given his family history.  Follow-up: No follow-ups on file.  Meds ordered this encounter  Medications  . atorvastatin (LIPITOR) 80 MG tablet    Sig: Take 1 tablet (80 mg total) by mouth daily.    Dispense:  90 tablet    Refill:  3  . triamcinolone cream (KENALOG) 0.1 %    Sig: Apply 1 application topically 2 (two) times daily.    Dispense:  454 g    Refill:  0   No orders of the defined types were placed in this encounter.   Signed,  Maud Deed. Copland, MD   Outpatient Encounter Medications as of 08/03/2019  Medication Sig  . ACCU-CHEK SOFTCLIX LANCETS lancets Check blood sugar once daily and as instructed. Dx E11.65  . amLODipine (NORVASC) 10 MG tablet TAKE 1 TABLET BY MOUTH ONCE DAILY  . aspirin 81 MG tablet Take 1 tablet (81 mg total) by mouth daily.  . Blood Glucose Monitoring Suppl (ACCU-CHEK AVIVA PLUS) w/Device KIT Check blood sugar once daily and as instructed. Dx E11.65  . calcium carbonate (TUMS - DOSED IN MG ELEMENTAL CALCIUM) 500 MG chewable tablet Chew 1 tablet by mouth daily as needed for indigestion or heartburn.  . fish oil-omega-3 fatty acids 1000 MG capsule Take 2 g by mouth daily.    Marland Kitchen glucose blood (ACCU-CHEK AVIVA PLUS) test strip Check blood sugar once daily and as instructed. Dx E11.65  . losartan (COZAAR) 100 MG tablet TAKE 1 TABLET(100 MG) BY MOUTH DAILY  . metFORMIN (GLUCOPHAGE-XR) 500 MG 24 hr tablet TAKE 4 TABLETS BY MOUTH EVERY DAY WITH BREAKFAST  . metoprolol tartrate (LOPRESSOR) 50 MG tablet Take 1 tablet (50 mg total) by mouth 2 (two) times daily.  . Multiple Vitamin (MULTIVITAMIN) tablet Take 1 tablet by mouth daily.    .  sildenafil (REVATIO) 20 MG tablet Generic Revatio / Sildanefil 20 mg. 2 - 5 tabs 30 mins prior to intercourse.  . [DISCONTINUED] atorvastatin (LIPITOR) 80 MG tablet Take 80 mg by mouth daily.  Marland Kitchen atorvastatin (LIPITOR) 80 MG tablet Take 1 tablet (80 mg total) by mouth daily.  Marland Kitchen triamcinolone cream (KENALOG) 0.1 % Apply 1 application topically 2 (two) times daily.  . [DISCONTINUED] cefixime (SUPRAX) 400 MG CAPS capsule Take 1 capsule (400 mg total) by mouth daily.   No  facility-administered encounter medications on file as of 08/03/2019.

## 2019-08-04 ENCOUNTER — Other Ambulatory Visit: Payer: Self-pay | Admitting: *Deleted

## 2019-08-04 ENCOUNTER — Other Ambulatory Visit: Payer: Self-pay

## 2019-08-04 MED ORDER — LOSARTAN POTASSIUM 100 MG PO TABS: ORAL_TABLET | ORAL | 3 refills | Status: DC

## 2019-08-04 MED ORDER — AMLODIPINE BESYLATE 10 MG PO TABS: 10.0000 mg | ORAL_TABLET | Freq: Every day | ORAL | 2 refills | Status: DC

## 2019-08-04 MED ORDER — METFORMIN HCL ER 500 MG PO TB24: ORAL_TABLET | ORAL | 3 refills | Status: DC

## 2019-08-04 NOTE — Telephone Encounter (Signed)
Requested Prescriptions   Signed Prescriptions Disp Refills   amLODipine (NORVASC) 10 MG tablet 90 tablet 2    Sig: Take 1 tablet (10 mg total) by mouth daily.    Authorizing Provider: GOLLAN, TIMOTHY J    Ordering User: NEWCOMER MCCLAIN, Nannette Zill L    

## 2019-08-10 ENCOUNTER — Other Ambulatory Visit: Payer: Self-pay

## 2019-08-10 ENCOUNTER — Telehealth: Payer: Self-pay

## 2019-08-10 ENCOUNTER — Encounter: Payer: Self-pay | Admitting: Family Medicine

## 2019-08-10 ENCOUNTER — Ambulatory Visit (INDEPENDENT_AMBULATORY_CARE_PROVIDER_SITE_OTHER): Payer: Medicare HMO | Admitting: Family Medicine

## 2019-08-10 VITALS — BP 138/62 | HR 57 | Temp 98.1°F | Ht 66.5 in | Wt 170.5 lb

## 2019-08-10 DIAGNOSIS — M545 Low back pain, unspecified: Secondary | ICD-10-CM

## 2019-08-10 NOTE — Telephone Encounter (Signed)
Pt left v/m that pt had pain in rt flank area and had appt on 06/17/19 and was given abx. When abx finished abx pain went away. Rt flank pain has returned on 08/08/19; pain level now is 4 and that pain comes and goes with alternating sharp and dull pain. No burning or pain or frequency of urine.Pt has no covid symptoms, no travel and no known exposure to + covid.pt scheduled in office appt with Dr Einar Pheasant today at 12 noon.

## 2019-08-10 NOTE — Patient Instructions (Signed)
#   Side pain - Take Ibuprofen over the next couple of days - try icing the area - try gentle stretching as well may help  Call back on Wednesday if no improvement to do urine sample to rule out infection  If severe pain that comes and goes develop - this may be concerning for kidney stones

## 2019-08-10 NOTE — Telephone Encounter (Signed)
See visit note today.

## 2019-08-10 NOTE — Progress Notes (Signed)
Subjective:     [name] is a 73 y.o. male presenting for Abdominal Pain (right flank pain. re started x 2 days ago. But no urinary issues.)     HPI   #abdominal Pain - was seen back in 06/17/2019 - pain over the right kidney - when moving around is less pain - worse when laying in the bed - had some sharp pain - no belly pain - does not travel or move - no hx of kidney stones - improved with Abx in the past - symptoms improved while taking Abx - urine did not show sign - symptoms started 2 days ago - symptoms improved within 2-3 days of starting abx with last episode - pain 4/10 - did take cranberry capsules today  Review of Systems  Constitutional: Negative for chills and fever.  Gastrointestinal: Positive for abdominal pain. Negative for constipation, diarrhea, nausea and vomiting.  Genitourinary: Positive for flank pain. Negative for dysuria, frequency, hematuria and urgency.     Social History   Tobacco Use  Smoking Status Former Smoker  Smokeless Tobacco Never Used  Tobacco Comment   quit 41 years ago        Objective:    BP Readings from Last 3 Encounters:  08/10/19 138/62  08/03/19 (!) 154/86  06/17/19 140/72   Wt Readings from Last 3 Encounters:  08/10/19 170 lb 8 oz (77.3 kg)  08/03/19 169 lb 4 oz (76.8 kg)  06/17/19 170 lb (77.1 kg)    BP 138/62   Pulse (!) 57   Temp 98.1 F (36.7 C)   Ht 5' 6.5" (1.689 m)   Wt 170 lb 8 oz (77.3 kg)   SpO2 97%   BMI 27.11 kg/m    Physical Exam Constitutional:      Appearance: Normal appearance. He is not ill-appearing or diaphoretic.  HENT:     Right Ear: External ear normal.     Left Ear: External ear normal.     Nose: Nose normal.  Eyes:     General: No scleral icterus.    Extraocular Movements: Extraocular movements intact.     Conjunctiva/sclera: Conjunctivae normal.  Cardiovascular:     Rate and Rhythm: Normal rate and regular rhythm.     Heart sounds: No murmur.  Pulmonary:       Effort: Pulmonary effort is normal. No respiratory distress.     Breath sounds: Normal breath sounds. No wheezing.  Abdominal:     General: Abdomen is flat. Bowel sounds are normal.     Palpations: Abdomen is soft.     Tenderness: There is no abdominal tenderness. There is no right CVA tenderness, left CVA tenderness, guarding or rebound.  Musculoskeletal:     Cervical back: Neck supple.     Comments: No spinous TTP no paraspinous TTP. Notes pain location is right side lower thoracic/upper lumbar area.   Skin:    General: Skin is warm and dry.  Neurological:     Mental Status: He is alert. Mental status is at baseline.  Psychiatric:        Mood and Affect: Mood normal.           Assessment & Plan:   Problem List Items Addressed This Visit    None    Visit Diagnoses    Acute right-sided low back pain without sciatica    -  Primary     Pain is similar to prior symptoms, however, location seems slightly lower than the kidney  area. Prior symptoms resolved while on abx, but no sign of infection and no infectious symptoms today. And worsening with movement  Suspect MSK related injury.   Advised NSAID, ice, stretching. If no improvement - urine to r/o infection. If worsening could consider kidney stone evaluation   Return if symptoms worsen or fail to improve.  Lesleigh Noe, MD

## 2019-08-17 ENCOUNTER — Other Ambulatory Visit: Payer: Self-pay | Admitting: Cardiovascular Disease

## 2019-10-16 DIAGNOSIS — E1136 Type 2 diabetes mellitus with diabetic cataract: Secondary | ICD-10-CM | POA: Diagnosis not present

## 2019-10-16 DIAGNOSIS — E119 Type 2 diabetes mellitus without complications: Secondary | ICD-10-CM | POA: Diagnosis not present

## 2019-10-16 LAB — HM DIABETES EYE EXAM

## 2019-10-18 ENCOUNTER — Other Ambulatory Visit: Payer: Self-pay | Admitting: Critical Care Medicine

## 2019-10-18 DIAGNOSIS — Z20822 Contact with and (suspected) exposure to covid-19: Secondary | ICD-10-CM | POA: Diagnosis not present

## 2019-10-19 LAB — NOVEL CORONAVIRUS, NAA: SARS-CoV-2, NAA: NOT DETECTED

## 2019-10-26 ENCOUNTER — Encounter: Payer: Self-pay | Admitting: Family Medicine

## 2019-11-15 ENCOUNTER — Other Ambulatory Visit: Payer: Self-pay | Admitting: Cardiovascular Disease

## 2020-02-13 ENCOUNTER — Other Ambulatory Visit: Payer: Self-pay | Admitting: Family Medicine

## 2020-02-15 NOTE — Telephone Encounter (Signed)
Left message for Mr. Vacha to return my call.  Need to verify if he is using [facility] or [facility] in [place] for his pharmacy.

## 2020-02-15 NOTE — Telephone Encounter (Signed)
Patient returned call He stated that he is using [facility] in [place] for his pharmacy

## 2020-04-05 ENCOUNTER — Other Ambulatory Visit: Payer: Self-pay | Admitting: Family Medicine

## 2020-05-26 ENCOUNTER — Ambulatory Visit: Payer: Medicare HMO | Admitting: Family Medicine

## 2020-05-27 ENCOUNTER — Other Ambulatory Visit: Payer: Self-pay

## 2020-05-27 ENCOUNTER — Encounter: Payer: Self-pay | Admitting: Family Medicine

## 2020-05-27 ENCOUNTER — Ambulatory Visit (INDEPENDENT_AMBULATORY_CARE_PROVIDER_SITE_OTHER): Payer: Medicare HMO | Admitting: Family Medicine

## 2020-05-27 VITALS — BP 150/80 | HR 65 | Temp 97.9°F | Ht 66.5 in | Wt 168.4 lb

## 2020-05-27 DIAGNOSIS — Z23 Encounter for immunization: Secondary | ICD-10-CM | POA: Diagnosis not present

## 2020-05-27 DIAGNOSIS — L821 Other seborrheic keratosis: Secondary | ICD-10-CM | POA: Insufficient documentation

## 2020-05-27 NOTE — Patient Instructions (Addendum)
Flu shot today These look like benign skin growths or seborrheic keratoses.  No need to treat them unless becoming bothersome.   Seborrheic Keratosis A seborrheic keratosis is a common, noncancerous (benign) skin growth. These growths are velvety, waxy, rough, tan, brown, or black spots that appear on the skin. These skin growths can be flat or raised, and scaly. What are the causes? The cause of this condition is not known. What increases the risk? You are more likely to develop this condition if you:  Have a family history of seborrheic keratosis.  Are 50 or older.  Are pregnant.  Have had estrogen replacement therapy. What are the signs or symptoms? Symptoms of this condition include growths on the face, chest, shoulders, back, or other areas. These growths:  Are usually painless, but may become irritated and itchy.  Can be yellow, brown, black, or other colors.  Are slightly raised or have a flat surface.  Are sometimes rough or wart-like in texture.  Are often velvety or waxy on the surface.  Are round or oval-shaped.  Often occur in groups, but may occur as a single growth. How is this diagnosed? This condition is diagnosed with a medical history and physical exam.  A sample of the growth may be tested (skin biopsy).  You may need to see a skin specialist (dermatologist). How is this treated? Treatment is not usually needed for this condition, unless the growths are irritated or bleed often.  You may also choose to have the growths removed if you do not like their appearance. ? Most commonly, these growths are treated with a procedure in which liquid nitrogen is applied to "freeze" off the growth (cryosurgery). ? They may also be burned off with electricity (electrocautery) or removed by scraping (curettage). Follow these instructions at home:  Watch your growth for any changes.  Keep all follow-up visits as told by your health care provider. This is  important.  Do not scratch or pick at the growth or growths. This can cause them to become irritated or infected. Contact a health care provider if:  You suddenly have many new growths.  Your growth bleeds, itches, or hurts.  Your growth suddenly becomes larger or changes color. Summary  A seborrheic keratosis is a common, noncancerous (benign) skin growth.  Treatment is not usually needed for this condition, unless the growths are irritated or bleed often.  Watch your growth for any changes.  Contact a health care provider if you suddenly have many new growths or your growth suddenly becomes larger or changes color.  Keep all follow-up visits as told by your health care provider. This is important. This information is not intended to replace advice given to you by your health care provider. Make sure you discuss any questions you have with your health care provider. Document Revised: 12/19/2017 Document Reviewed: 12/19/2017 Elsevier Patient Education  [place].

## 2020-05-27 NOTE — Assessment & Plan Note (Addendum)
Reassured on benign nature of condition. Update if they become bothersome. Handout provided today.

## 2020-05-27 NOTE — Progress Notes (Signed)
This visit was conducted in person.  BP (!) 150/80 (BP Location: Right Arm, Patient Position: Sitting, Cuff Size: Normal)   Pulse 65   Temp 97.9 F (36.6 C) (Temporal)   Ht 5' 6.5" (1.689 m)   Wt 168 lb 7 oz (76.4 kg)   SpO2 97%   BMI 26.78 kg/m    CC: check mole on back Subjective:    Patient ID: [name], male    DOB: 1945/10/16, 74 y.o.   MRN: [id number]  HPI: JAYSTON TREVINO is a 74 y.o. male presenting on 05/27/2020 for Nevus (C/o mole on back.  Noticed changes in texture.  Seems crusty now. )   Notes growing mole on back - elongating, crusty. Not tender or itchy.  Unsure how long present     Relevant past medical, surgical, family and social history reviewed and updated as indicated. Interim medical history since our last visit reviewed. Allergies and medications reviewed and updated. Outpatient Medications Prior to Visit  Medication Sig Dispense Refill  . ACCU-CHEK SOFTCLIX LANCETS lancets Check blood sugar once daily and as instructed. Dx E11.65 100 each 3  . amLODipine (NORVASC) 10 MG tablet TAKE 1 TABLET BY MOUTH ONCE DAILY. 180 tablet 2  . aspirin 81 MG tablet Take 1 tablet (81 mg total) by mouth daily.    Marland Kitchen atorvastatin (LIPITOR) 80 MG tablet TAKE 1 TABLET(80 MG) BY MOUTH DAILY 90 tablet 0  . Blood Glucose Monitoring Suppl (ACCU-CHEK AVIVA PLUS) w/Device KIT Check blood sugar once daily and as instructed. Dx E11.65 1 kit 0  . calcium carbonate (TUMS - DOSED IN MG ELEMENTAL CALCIUM) 500 MG chewable tablet Chew 1 tablet by mouth daily as needed for indigestion or heartburn.    . fish oil-omega-3 fatty acids 1000 MG capsule Take 2 g by mouth daily.      Marland Kitchen glucose blood (ACCU-CHEK AVIVA PLUS) test strip Check blood sugar once daily and as instructed. Dx E11.65 100 each 3  . losartan (COZAAR) 100 MG tablet TAKE 1 TABLET(100 MG) BY MOUTH DAILY 90 tablet 1  . metFORMIN (GLUCOPHAGE-XR) 500 MG 24 hr tablet TAKE 4 TABLETS BY MOUTH EVERY DAY WITH BREAKFAST 360 tablet 1    . metoprolol tartrate (LOPRESSOR) 50 MG tablet TAKE 1 TABLET(50 MG) BY MOUTH TWICE DAILY 180 tablet 3  . Multiple Vitamin (MULTIVITAMIN) tablet Take 1 tablet by mouth daily.      . sildenafil (REVATIO) 20 MG tablet Generic Revatio / Sildanefil 20 mg. 2 - 5 tabs 30 mins prior to intercourse. 50 tablet 11   No facility-administered medications prior to visit.     Per HPI unless specifically indicated in ROS section below Review of Systems Objective:  BP (!) 150/80 (BP Location: Right Arm, Patient Position: Sitting, Cuff Size: Normal)   Pulse 65   Temp 97.9 F (36.6 C) (Temporal)   Ht 5' 6.5" (1.689 m)   Wt 168 lb 7 oz (76.4 kg)   SpO2 97%   BMI 26.78 kg/m   Wt Readings from Last 3 Encounters:  05/27/20 168 lb 7 oz (76.4 kg)  08/10/19 170 lb 8 oz (77.3 kg)  08/03/19 169 lb 4 oz (76.8 kg)      Physical Exam Vitals and nursing note reviewed.  Constitutional:      Appearance: Normal appearance. He is not ill-appearing.  Skin:    General: Skin is warm and dry.     Findings: Lesion present. No rash.     Comments: Benign  appearing growths on mid back skin, stuck on appearance  Neurological:     Mental Status: He is alert.       Assessment & Plan:  This visit occurred during the SARS-CoV-2 public health emergency.  Safety protocols were in place, including screening questions prior to the visit, additional usage of staff PPE, and extensive cleaning of exam room while observing appropriate contact time as indicated for disinfecting solutions.   Problem List Items Addressed This Visit    Seborrheic keratosis - Primary    Reassured on benign nature of condition. Update if they become bothersome. Handout provided today.        Other Visit Diagnoses    Need for influenza vaccination       Relevant Orders   Flu Vaccine QUAD High Dose(Fluad) (Completed)       No orders of the defined types were placed in this encounter.  Orders Placed This Encounter  Procedures  . Flu  Vaccine QUAD High Dose(Fluad)    Patient instructions: Flu shot today These look like benign skin growths or seborrheic keratoses.  No need to treat them unless becoming bothersome.   Follow up plan: Return if symptoms worsen or fail to improve.  Ria Bush, MD

## 2020-06-29 ENCOUNTER — Encounter: Payer: Self-pay | Admitting: Family

## 2020-06-29 ENCOUNTER — Ambulatory Visit: Payer: Medicare HMO | Admitting: Family

## 2020-06-29 ENCOUNTER — Other Ambulatory Visit: Payer: Self-pay | Admitting: Family Medicine

## 2020-06-29 ENCOUNTER — Other Ambulatory Visit: Payer: Self-pay

## 2020-06-29 ENCOUNTER — Other Ambulatory Visit: Payer: Self-pay | Admitting: Family

## 2020-06-29 VITALS — BP 146/72 | HR 57 | Ht 67.0 in | Wt 164.0 lb

## 2020-06-29 DIAGNOSIS — E782 Mixed hyperlipidemia: Secondary | ICD-10-CM

## 2020-06-29 DIAGNOSIS — I10 Essential (primary) hypertension: Secondary | ICD-10-CM

## 2020-06-29 DIAGNOSIS — Z789 Other specified health status: Secondary | ICD-10-CM

## 2020-06-29 DIAGNOSIS — Z7289 Other problems related to lifestyle: Secondary | ICD-10-CM

## 2020-06-29 DIAGNOSIS — I25118 Atherosclerotic heart disease of native coronary artery with other forms of angina pectoris: Secondary | ICD-10-CM

## 2020-06-29 MED ORDER — HYDROCHLOROTHIAZIDE 25 MG PO TABS: 25.0000 mg | ORAL_TABLET | Freq: Every day | ORAL | 1 refills | Status: DC

## 2020-06-29 MED ORDER — METOPROLOL TARTRATE 50 MG PO TABS: ORAL_TABLET | ORAL | 3 refills | Status: DC

## 2020-06-29 NOTE — Progress Notes (Signed)
Office Visit    Patient Name: [name] Date of Encounter: 06/29/2020  Primary Care Provider:  [name], MD Primary Cardiologist:  Ida Rogue, MD Electrophysiologist:  None   Chief Complaint    [name] is a 74 y.o. male with a hx of CAD s/p CABG X5 2006, DM 2, HTN, HLD presents today for follow-up of CAD  Past Medical History    Past Medical History:  Diagnosis Date  . Alcohol abuse, unspecified   . CAD (coronary artery disease) 9/06   CABG x4  . Colon polyps   . Depression   . DMII (diabetes mellitus, type 2) ([place]) 10/03  . HLD (hyperlipidemia) 10/03  . HTN (hypertension) 10/03   Past Surgical History:  Procedure Laterality Date  . COLONOSCOPY    . CORONARY ARTERY BYPASS GRAFT  9/06   x4  . ESOPHAGOGASTRODUODENOSCOPY  7/07   polyp; mucosal abnml  . TONSILLECTOMY      Allergies  Allergies  Allergen Reactions  . Sulfonamide Derivatives     REACTION: unknown reaction    History of Present Illness    [name] is a 74 y.o. male with a hx of  CAD s/p CABG X5 2006, DM 2, HTN, HLD  last seen 06/01/2019 by Dr. Rockey Situ.  Presents today for follow-up.  He has been walking 10,000 steps per day and attributes his 7 pound weight loss over the last year to this. Checks BP at home " on occasion" and tells me it is often SBP 140-150s.  He does not think his blood pressure has been at goal of less than 130/80 for a number of years. Drinks about 3-4 bourbon drinks per night.  We discussed the role that this could have on his hypertension.   He reports no chest pain, pressure, tightness.  Reports no shortness of breath at rest nor dyspnea on exertion.  No lightheadedness, dizziness, near hematoma syncope.  EKGs/Labs/Other Studies Reviewed:   The following studies were reviewed today:  EKG:  EKG is  ordered today.  The ekg ordered today demonstrates sinus bradycardia 57 bpm with no acute ST/T wave changes.  Recent Labs: 07/29/2019: ALT 32; BUN  17; Creatinine, Ser 1.12; Hemoglobin 14.7; Platelets 154.0; Potassium 4.1; Sodium 139  Recent Lipid Panel    Component Value Date/Time   CHOL 131 07/29/2019 0956   TRIG 179.0 (H) 07/29/2019 0956   HDL 39.10 07/29/2019 0956   CHOLHDL 3 07/29/2019 0956   VLDL 35.8 07/29/2019 0956   LDLCALC 56 07/29/2019 0956   LDLDIRECT 61.0 02/10/2018 0844     Home Medications   Current Meds  Medication Sig  . ACCU-CHEK SOFTCLIX LANCETS lancets Check blood sugar once daily and as instructed. Dx E11.65  . amLODipine (NORVASC) 10 MG tablet TAKE 1 TABLET BY MOUTH ONCE DAILY.  Marland Kitchen aspirin 81 MG tablet Take 1 tablet (81 mg total) by mouth daily.  Marland Kitchen atorvastatin (LIPITOR) 80 MG tablet TAKE 1 TABLET(80 MG) BY MOUTH DAILY  . Blood Glucose Monitoring Suppl (ACCU-CHEK AVIVA PLUS) w/Device KIT Check blood sugar once daily and as instructed. Dx E11.65  . calcium carbonate (TUMS - DOSED IN MG ELEMENTAL CALCIUM) 500 MG chewable tablet Chew 1 tablet by mouth daily as needed for indigestion or heartburn.  . fish oil-omega-3 fatty acids 1000 MG capsule Take 2 g by mouth daily.    Marland Kitchen glucose blood (ACCU-CHEK AVIVA PLUS) test strip Check blood sugar once daily and as instructed. Dx E11.65  .  losartan (COZAAR) 100 MG tablet TAKE 1 TABLET(100 MG) BY MOUTH DAILY  . metFORMIN (GLUCOPHAGE-XR) 500 MG 24 hr tablet TAKE 4 TABLETS BY MOUTH EVERY DAY WITH BREAKFAST  . metoprolol tartrate (LOPRESSOR) 50 MG tablet TAKE 1 TABLET(50 MG) BY MOUTH TWICE DAILY  . Multiple Vitamin (MULTIVITAMIN) tablet Take 1 tablet by mouth daily.       Review of Systems  All other systems reviewed and are otherwise negative except as noted above.  Physical Exam    VS:  BP (!) 150/78 (BP Location: Left Arm, Patient Position: Sitting, Cuff Size: Normal)   Pulse (!) 57   Ht 5' 7"  (1.702 m)   Wt 164 lb (74.4 kg)   SpO2 98%   BMI 25.69 kg/m  , BMI Body mass index is 25.69 kg/m.  Wt Readings from Last 3 Encounters:  06/29/20 164 lb (74.4 kg)   05/27/20 168 lb 7 oz (76.4 kg)  08/10/19 170 lb 8 oz (77.3 kg)    GEN: Well nourished, well developed, in no acute distress. HEENT: normal. Neck: Supple, no JVD, carotid bruits, or masses. Cardiac: RRR, no murmurs, rubs, or gallops. No clubbing, cyanosis, edema.  Radials/DP/PT 2+ and equal bilaterally.  Respiratory:  Respirations regular and unlabored, clear to auscultation bilaterally. GI: Soft, nontender, nondistended. MS: No deformity or atrophy. Skin: Warm and dry, no rash. Neuro:  Strength and sensation are intact. Psych: Normal affect.  Assessment & Plan    1. CAD-stable with no anginal symptoms.  EKG today shows sinus bradycardia 57 bpm with no acute ST/T wave changes.  No indication for ischemic evaluation this time.  GDMT includes aspirin, metoprolol, atorvastatin.  Low-sodium, heart healthy diet encouraged.  Regular cardiovascular exercise encouraged.  2. HTN- BP elevated above goal of 130/80.  He reports taking his amlodipine 10 mg daily, metoprolol tartrate 50 mg twice daily, losartan 100 mg daily regularly.  Home BP also consistently greater than 130/80.  Start hydrochlorothiazide 25 mg daily.  We have provided a 1 month supply.  Check in on blood pressure in 1 week via MyChart message.  He has upcoming labs with primary care 08/01/2020 which will allow [place] to recheck his kidney function and electrolytes.  If he tolerates the addition of HCTZ 25 mg daily well, we will plan to switch to combination Losartan-HCTZ tab.  3. HLD, LDL <70- 07/2019 LDL 56.  He has upcoming labs with his primary care provider.  Continue atorvastatin 80 mg daily.  4. DM2- Continue to follow with primary care provider. A1c 07/29/19 with 6.7.   5. Alcohol use -drinking 3-4 bourbon drinks per night.  Discussed the role this can play on hypertension as well as diabetes.  Encouraged to reduce intake of alcohol.  Disposition: Follow up in 3 month(s) with Dr. Rockey Situ or APP  Signed, Loel Dubonnet,  NP 06/29/2020, 11:27 AM [place]

## 2020-06-29 NOTE — Patient Instructions (Addendum)
Medication Instructions:  Your physician has recommended you make the following change in your medication:   START Hydrochlorothiazide (HCTZ) 25mg  daily  *If you need a refill on your cardiac medications before your next appointment, please call your pharmacy*   Lab Work: No lab work today.   Testing/Procedures: Your EKG today showed sinus bradycardia which is a stable finding. No signs of blockages.   Follow-Up: At [facility], you and your health needs are our priority.  As part of our continuing mission to provide you with exceptional heart care, we have created designated Provider Care Teams.  These Care Teams include your primary Cardiologist (physician) and Advanced Practice Providers (APPs -  Physician Assistants and Nurse Practitioners) who all work together to provide you with the care you need, when you need it.  We recommend signing up for the patient portal called "MyChart".  Sign up information is provided on this After Visit Summary.  MyChart is used to connect with patients for Virtual Visits (Telemedicine).  Patients are able to view lab/test results, encounter notes, upcoming appointments, etc.  Non-urgent messages can be sent to your provider as well.   To learn more about what you can do with MyChart, go to [email or web address].    Your next appointment:   3 month(s) for recheck of blood pressure  The format for your next appointment:   In Person  Provider:   You may see Ida Rogue, MD or one of the following Advanced Practice Providers on your designated Care Team:    Murray Hodgkins, NP  Christell Faith, PA-C  Laurann Montana, NP  Marrianne Mood, PA-C  Cadence Kathlen Mody, [place]  Other Instructions  Laurann Montana, NP will send you a message in 1 week to check in on your blood pressure. Our goal is for is for your blood pressure to be less than 130/80.

## 2020-07-07 ENCOUNTER — Encounter: Payer: Self-pay | Admitting: Family

## 2020-07-29 ENCOUNTER — Other Ambulatory Visit: Payer: Self-pay | Admitting: Family Medicine

## 2020-07-29 DIAGNOSIS — E1165 Type 2 diabetes mellitus with hyperglycemia: Secondary | ICD-10-CM

## 2020-07-29 DIAGNOSIS — Z125 Encounter for screening for malignant neoplasm of prostate: Secondary | ICD-10-CM

## 2020-07-29 DIAGNOSIS — E785 Hyperlipidemia, unspecified: Secondary | ICD-10-CM

## 2020-07-29 DIAGNOSIS — Z79899 Other long term (current) drug therapy: Secondary | ICD-10-CM

## 2020-08-01 ENCOUNTER — Other Ambulatory Visit: Payer: Self-pay

## 2020-08-01 ENCOUNTER — Ambulatory Visit: Payer: Medicare HMO

## 2020-08-01 ENCOUNTER — Other Ambulatory Visit (INDEPENDENT_AMBULATORY_CARE_PROVIDER_SITE_OTHER): Payer: Medicare HMO

## 2020-08-01 DIAGNOSIS — E785 Hyperlipidemia, unspecified: Secondary | ICD-10-CM

## 2020-08-01 DIAGNOSIS — E1165 Type 2 diabetes mellitus with hyperglycemia: Secondary | ICD-10-CM

## 2020-08-01 DIAGNOSIS — Z125 Encounter for screening for malignant neoplasm of prostate: Secondary | ICD-10-CM | POA: Diagnosis not present

## 2020-08-01 DIAGNOSIS — Z79899 Other long term (current) drug therapy: Secondary | ICD-10-CM

## 2020-08-01 LAB — BASIC METABOLIC PANEL
BUN: 16 mg/dL (ref 6–23)
CO2: 31 mEq/L (ref 19–32)
Calcium: 10 mg/dL (ref 8.4–10.5)
Chloride: 102 mEq/L (ref 96–112)
Creatinine, Ser: 1.12 mg/dL (ref 0.40–1.50)
GFR: 64.86 mL/min (ref >60.00)
Glucose, Bld: 131 mg/dL — ABNORMAL HIGH (ref 70–99)
Potassium: 4.3 mEq/L (ref 3.5–5.1)
Sodium: 141 mEq/L (ref 135–145)

## 2020-08-01 LAB — LIPID PANEL
Cholesterol: 128 mg/dL (ref 0–200)
HDL: 36.4 mg/dL — ABNORMAL LOW (ref >39.00)
LDL Cholesterol: 55 mg/dL (ref 0–99)
NonHDL: 91.56
Total CHOL/HDL Ratio: 4
Triglycerides: 185 mg/dL — ABNORMAL HIGH (ref 0.0–149.0)
VLDL: 37 mg/dL (ref 0.0–40.0)

## 2020-08-01 LAB — HEPATIC FUNCTION PANEL
ALT: 32 U/L (ref 0–53)
AST: 23 U/L (ref 0–37)
Albumin: 4.7 g/dL (ref 3.5–5.2)
Alkaline Phosphatase: 41 U/L (ref 39–117)
Bilirubin, Direct: 0.2 mg/dL (ref 0.0–0.3)
Total Bilirubin: 0.9 mg/dL (ref 0.2–1.2)
Total Protein: 7.4 g/dL (ref 6.0–8.3)

## 2020-08-01 LAB — CBC WITH DIFFERENTIAL/PLATELET
Basophils Absolute: 0.1 10*3/uL (ref 0.0–0.1)
Basophils Relative: 1.1 % (ref 0.0–3.0)
Eosinophils Absolute: 0.3 10*3/uL (ref 0.0–0.7)
Eosinophils Relative: 5.9 % — ABNORMAL HIGH (ref 0.0–5.0)
HCT: 44 % (ref 39.0–52.0)
Hemoglobin: 15.2 g/dL (ref 13.0–17.0)
Lymphocytes Relative: 30.2 % (ref 12.0–46.0)
Lymphs Abs: 1.4 10*3/uL (ref 0.7–4.0)
MCHC: 34.5 g/dL (ref 30.0–36.0)
MCV: 93.4 fl (ref 78.0–100.0)
Monocytes Absolute: 0.4 10*3/uL (ref 0.1–1.0)
Monocytes Relative: 9.6 % (ref 3.0–12.0)
Neutro Abs: 2.4 10*3/uL (ref 1.4–7.7)
Neutrophils Relative %: 53.2 % (ref 43.0–77.0)
Platelets: 168 10*3/uL (ref 150.0–400.0)
RBC: 4.71 Mil/uL (ref 4.22–5.81)
RDW: 13.3 % (ref 11.5–15.5)
WBC: 4.5 10*3/uL (ref 4.0–10.5)

## 2020-08-01 LAB — PSA, MEDICARE: PSA: 1.09 ng/ml (ref 0.10–4.00)

## 2020-08-01 LAB — HEMOGLOBIN A1C: Hgb A1c MFr Bld: 6.6 % — ABNORMAL HIGH (ref 4.6–6.5)

## 2020-08-01 LAB — MICROALBUMIN / CREATININE URINE RATIO
Creatinine,U: 73.7 mg/dL
Microalb Creat Ratio: 2 mg/g (ref 0.0–30.0)
Microalb, Ur: 1.4 mg/dL (ref 0.0–1.9)

## 2020-08-04 ENCOUNTER — Other Ambulatory Visit: Payer: Self-pay

## 2020-08-04 ENCOUNTER — Ambulatory Visit (INDEPENDENT_AMBULATORY_CARE_PROVIDER_SITE_OTHER): Payer: Medicare HMO | Admitting: Family Medicine

## 2020-08-04 VITALS — BP 160/82 | HR 60 | Temp 97.1°F | Ht 66.0 in | Wt 163.0 lb

## 2020-08-04 DIAGNOSIS — Z0000 Encounter for general adult medical examination without abnormal findings: Secondary | ICD-10-CM | POA: Diagnosis not present

## 2020-08-04 NOTE — Patient Instructions (Signed)
Stop drinking...forever   Check your blood pressure in the early evening.  I think it will be lower.

## 2020-08-04 NOTE — Progress Notes (Signed)
Gregory Beckstrom T. Munir Victorian, MD, [place] [facility] [place], [address]  Phone: [phone]  FAX: [phone]  [name] - 73 y.o. male  MRN [id number]  Date of Birth: Oct 30, 1945  Date: 08/04/2020  PCP: [name], MD  Referral: [name], MD  Chief Complaint  Patient presents with  . Annual Exam    This visit occurred during the SARS-CoV-2 public health emergency.  Safety protocols were in place, including screening questions prior to the visit, additional usage of staff PPE, and extensive cleaning of exam room while observing appropriate contact time as indicated for disinfecting solutions.   Patient Care Team: [name], MD as PCP - General Rockey Situ, Kathlene November, MD as PCP - Cardiology (Cardiology) Minna Merritts, MD as Consulting Physician (Cardiology) Subjective:   [name] is a 74 y.o. pleasant patient who presents with the following:  Preventative Health Maintenance Visit:  Health Maintenance Summary Reviewed and updated, unless pt declines services.  Tobacco History Reviewed. Alcohol: He is drinking at a bare minimum 3 drinks nightly to at times quite a bit more than this  exercise Habits: Some activity, walking  STD concerns: no risk or activity to increase risk Drug Use: None  Moderna #3 Check feet  BP remains high. 160/82  Was put on HCTZ - does have a sulfa allergy - something when he was little Max on losartan and amlodipine On metoprolol 50 mg bid  Drinking 3-4 drinks nightly, sometimes up to 6 He will check in the afternoon - ETOH?  Diabetes Mellitus: Tolerating Medications: yes Compliance with diet: fair, Body mass index is 26.31 kg/m. Exercise: minimal / intermittent Avg blood sugars at home: not checking Foot problems: none Hypoglycemia: none No nausea, vomitting, blurred vision, polyuria.  Lab Results  Component  Value Date   HGBA1C 6.6 (H) 08/01/2020   HGBA1C 6.7 (H) 07/29/2019   HGBA1C 6.6 (H) 03/09/2019   Lab Results  Component Value Date   MICROALBUR 1.4 08/01/2020   LDLCALC 55 08/01/2020   CREATININE 1.12 08/01/2020    Wt Readings from Last 3 Encounters:  08/04/20 163 lb (73.9 kg)  06/29/20 164 lb (74.4 kg)  05/27/20 168 lb 7 oz (76.4 kg)    12/15/2019 vaccine #2  Health Maintenance  Topic Date Due  . FOOT EXAM  05/30/2016  . TETANUS/TDAP  04/13/2019  . COVID-19 Vaccine (3 - Moderna risk 4-dose series) 01/12/2020  . OPHTHALMOLOGY EXAM  10/15/2020  . HEMOGLOBIN A1C  01/30/2021  . Fecal DNA (Cologuard)  08/29/2021  . INFLUENZA VACCINE  Completed  . Hepatitis C Screening  Completed  . PNA vac Low Risk Adult  Completed   Immunization History  Administered Date(s) Administered  . Fluad Quad(high Dose 65+) 05/27/2020  . Influenza Split 08/02/2011, 06/11/2012  . Influenza Whole 08/10/2010  . Influenza, High Dose Seasonal PF 05/18/2014  . Influenza,inj,Quad PF,6+ Mos 05/25/2013, 05/30/2015, 06/13/2017  . Influenza-Unspecified 05/16/2016  . Moderna Sars-Covid-2 Vaccination 11/17/2019, 12/15/2019  . Pneumococcal Conjugate-13 05/30/2015  . Pneumococcal Polysaccharide-23 10/11/2011  . Td 08/20/1993, 04/12/2009   Patient Active Problem List   Diagnosis Date Noted  . CAD (coronary artery disease), autologous vein bypass graft 10/11/2011    Priority: High  . Diabetes type 2, uncontrolled ([place]) 04/13/2009    Priority: High  . HTN (hypertension)     Priority: Medium  . Hyperlipidemia LDL goal <70 05/13/2008    Priority:  Medium  . Seborrheic keratosis 05/27/2020  . CAD (coronary artery disease), native coronary artery 05/25/2018  . Alcohol abuse 08/10/2010  . History of colonic polyps 03/12/2007  . DEPRESSION 12/03/2006    Past Medical History:  Diagnosis Date  . Alcohol abuse, unspecified   . CAD (coronary artery disease) 9/06   CABG x4  . Colon polyps   . Depression   .  DMII (diabetes mellitus, type 2) ([place]) 10/03  . HLD (hyperlipidemia) 10/03  . HTN (hypertension) 10/03    Past Surgical History:  Procedure Laterality Date  . COLONOSCOPY    . CORONARY ARTERY BYPASS GRAFT  9/06   x4  . ESOPHAGOGASTRODUODENOSCOPY  7/07   polyp; mucosal abnml  . TONSILLECTOMY      Family History  Problem Relation Age of Onset  . Alcohol abuse Father   . Stroke Father   . Other Father        cardiac problems  . Heart disease Father   . COPD Mother   . Alcohol abuse Brother   . Cirrhosis Brother        EtOH  . Anxiety disorder Brother   . Heart disease Paternal Grandmother   . Colon cancer Neg Hx   . Esophageal cancer Neg Hx   . Rectal cancer Neg Hx   . Stomach cancer Neg Hx     Past Medical History, Surgical History, Social History, Family History, Problem List, Medications, and Allergies have been reviewed and updated if relevant.  Review of Systems: Pertinent positives are listed above.  Otherwise, a full 14 point review of systems has been done in full and it is negative except where it is noted positive.  Objective:   BP (!) 160/82 (BP Location: Left Arm, Patient Position: Sitting)   Pulse 60   Temp (!) 97.1 F (36.2 C) (Temporal)   Ht _0  (1.676 m)   Wt 163 lb (73.9 kg)   SpO2 99%   BMI 26.31 kg/m  Ideal Body Weight: Weight in (lb) to have BMI = 25: 154.6  Ideal Body Weight: Weight in (lb) to have BMI = 25: 154.6  Hearing Screening   _1  _2  _3  _4  _5  _6  _7  _8  _9   Right ear:   _10 Left ear:   _11 Visual Acuity Screening   Right eye Left eye Both eyes  Without correction: _12  With correction:      Depression screen [facility] 2/9 07/27/2019 07/21/2018 06/13/2017 06/06/2016 05/30/2015  Decreased Interest 1 0 0 0 0  Down, Depressed, Hopeless 1 0 0 - 0  PHQ - 2 Score 2 0 0 0 0  Altered sleeping 0 0 - - -  Tired, decreased energy 0 0 - - -  Change in appetite 0 0 - - -   Feeling bad or failure about yourself  0 0 - - -  Trouble concentrating 0 0 - - -  Moving slowly or fidgety/restless 0 0 - - -  Suicidal thoughts 0 0 - - -  PHQ-9 Score 2 0 - - -  Difficult doing work/chores Not difficult at all Not difficult at all - - -     GEN: well developed, well nourished, no acute distress Eyes: conjunctiva and lids normal, PERRLA, EOMI ENT: TM clear, nares clear, oral exam WNL Neck: supple, no lymphadenopathy, no thyromegaly, no JVD Pulm: clear to auscultation and percussion, respiratory effort  normal CV: regular rate and rhythm, S1-S2, no murmur, rub or gallop, no bruits, peripheral pulses normal and symmetric, no cyanosis, clubbing, edema or varicosities GI: soft, non-tender; no hepatosplenomegaly, masses; active bowel sounds all quadrants GU: deferred Lymph: no cervical, axillary or inguinal adenopathy MSK: gait normal, muscle tone and strength WNL, no joint swelling, effusions, discoloration, crepitus  SKIN: clear, good turgor, color WNL, no rashes, lesions, or ulcerations Neuro: normal mental status, normal strength, sensation, and motion Psych: alert; oriented to person, place and time, normally interactive and not anxious or depressed in appearance.  All labs reviewed with patient. Results for orders placed or performed in visit on 08/01/20  PSA, Medicare  Result Value Ref Range   PSA 1.09 0.10 - 4.00 ng/ml  Hemoglobin A1c  Result Value Ref Range   Hgb A1c MFr Bld 6.6 (H) 4.6 - 6.5 %  Microalbumin / creatinine urine ratio  Result Value Ref Range   Microalb, Ur 1.4 0.0 - 1.9 mg/dL   Creatinine,U 73.7 mg/dL   Microalb Creat Ratio 2.0 0.0 - 30.0 mg/g  CBC with Differential/Platelet  Result Value Ref Range   WBC 4.5 4.0 - 10.5 K/uL   RBC 4.71 4.22 - 5.81 Mil/uL   Hemoglobin 15.2 13.0 - 17.0 g/dL   HCT 44.0 39.0 - 52.0 %   MCV 93.4 78.0 - 100.0 fl   MCHC 34.5 30.0 - 36.0 g/dL   RDW 13.3 11.5 - 15.5 %   Platelets 168.0 150.0 - 400.0 K/uL    Neutrophils Relative % 53.2 43.0 - 77.0 %   Lymphocytes Relative 30.2 12.0 - 46.0 %   Monocytes Relative 9.6 3.0 - 12.0 %   Eosinophils Relative 5.9 (H) 0.0 - 5.0 %   Basophils Relative 1.1 0.0 - 3.0 %   Neutro Abs 2.4 1.4 - 7.7 K/uL   Lymphs Abs 1.4 0.7 - 4.0 K/uL   Monocytes Absolute 0.4 0.1 - 1.0 K/uL   Eosinophils Absolute 0.3 0.0 - 0.7 K/uL   Basophils Absolute 0.1 0.0 - 0.1 K/uL  Basic metabolic panel  Result Value Ref Range   Sodium 141 135 - 145 mEq/L   Potassium 4.3 3.5 - 5.1 mEq/L   Chloride 102 96 - 112 mEq/L   CO2 31 19 - 32 mEq/L   Glucose, Bld 131 (H) 70 - 99 mg/dL   BUN 16 6 - 23 mg/dL   Creatinine, Ser 1.12 0.40 - 1.50 mg/dL   GFR 64.86 >60.00 mL/min   Calcium 10.0 8.4 - 10.5 mg/dL  Hepatic function panel  Result Value Ref Range   Total Bilirubin 0.9 0.2 - 1.2 mg/dL   Bilirubin, Direct 0.2 0.0 - 0.3 mg/dL   Alkaline Phosphatase 41 39 - 117 U/L   AST 23 0 - 37 U/L   ALT 32 0 - 53 U/L   Total Protein 7.4 6.0 - 8.3 g/dL   Albumin 4.7 3.5 - 5.2 g/dL  Lipid panel  Result Value Ref Range   Cholesterol 128 0 - 200 mg/dL   Triglycerides 185.0 (H) 0.0 - 149.0 mg/dL   HDL 36.40 (L) >39.00 mg/dL   VLDL 37.0 0.0 - 40.0 mg/dL   LDL Cholesterol 55 0 - 99 mg/dL   Total CHOL/HDL Ratio 4    NonHDL 91.56     Assessment and Plan:     ICD-10-CM   1. Healthcare maintenance  Z00.00    The primary issue with time is his continued excess alcohol use.  He has had in my  opinion a problem for years.  He drinks at a very minimum 3 shots of liquor a night upwards to 6+ on an every day pattern.  Recommended that he stop drinking entirely forever.  My suspicion is that his a.m. blood pressures are highly influenced by this post alcohol.  Recommended very strongly that he check his blood pressures in the late afternoon to evening.  His pulse is modestly low, but he does have a sulfa allergy.  Think that increase his Lopressor if his blood pressure remains elevated then this might  reasonable thing to consider.  Health Maintenance Exam: The patient's preventative maintenance and recommended screening tests for an annual wellness exam were reviewed in full today. Brought up to date unless services declined.  Counselled on the importance of diet, exercise, and its role in overall health and mortality. The patient's FH and SH was reviewed, including their home life, tobacco status, and drug and alcohol status.  Follow-up in 1 year for physical exam or additional follow-up below.  Follow-up: No follow-ups on file. Or follow-up in 1 year if not noted.  No orders of the defined types were placed in this encounter.  There are no discontinued medications. No orders of the defined types were placed in this encounter.   Signed,  Maud Deed. Lekisha Mcghee, MD   Allergies as of 08/04/2020      Reactions   Sulfonamide Derivatives    REACTION: unknown reaction      Medication List       Accurate as of August 04, 2020 10:50 AM. If you have any questions, ask your nurse or doctor.        Accu-Chek Aviva Plus w/Device Kit Check blood sugar once daily and as instructed. Dx E11.65   Accu-Chek Softclix Lancets lancets Check blood sugar once daily and as instructed. Dx E11.65   amLODipine 10 MG tablet Commonly known as: NORVASC TAKE 1 TABLET BY MOUTH ONCE DAILY.   aspirin 81 MG tablet Take 1 tablet (81 mg total) by mouth daily.   atorvastatin 80 MG tablet Commonly known as: LIPITOR TAKE 1 TABLET(80 MG) BY MOUTH DAILY   calcium carbonate 500 MG chewable tablet Commonly known as: TUMS - dosed in mg elemental calcium Chew 1 tablet by mouth daily as needed for indigestion or heartburn.   fish oil-omega-3 fatty acids 1000 MG capsule Take 2 g by mouth daily.   glucose blood test strip Commonly known as: Accu-Chek Aviva Plus Check blood sugar once daily and as instructed. Dx E11.65   hydrochlorothiazide 25 MG tablet Commonly known as: HYDRODIURIL Take 1 tablet  (25 mg total) by mouth daily.   losartan 100 MG tablet Commonly known as: COZAAR TAKE 1 TABLET(100 MG) BY MOUTH DAILY   metFORMIN 500 MG 24 hr tablet Commonly known as: GLUCOPHAGE-XR TAKE 4 TABLETS BY MOUTH EVERY DAY WITH BREAKFAST   metoprolol tartrate 50 MG tablet Commonly known as: LOPRESSOR TAKE 1 TABLET(50 MG) BY MOUTH TWICE DAILY   multivitamin tablet Take 1 tablet by mouth daily.

## 2020-08-05 ENCOUNTER — Encounter: Payer: Self-pay | Admitting: Family Medicine

## 2020-08-11 ENCOUNTER — Other Ambulatory Visit: Payer: Self-pay | Admitting: Family Medicine

## 2020-08-23 ENCOUNTER — Ambulatory Visit: Payer: Medicare HMO

## 2020-08-23 ENCOUNTER — Telehealth: Payer: Self-pay

## 2020-08-23 NOTE — Telephone Encounter (Signed)
Called patient and left message that AWV visit will be cancelled for today. According to LOS history in Epic, AWV was completed by provider on 08/04/2020. [id number] code was used. Can only bill once every 12 months.

## 2020-08-23 NOTE — Progress Notes (Deleted)
Subjective:   [name] is a 75 y.o. male who presents for Medicare Annual/Subsequent preventive examination.  Review of Systems: N/A      I connected with the patient today by telephone and verified that I am speaking with the correct person using two identifiers. Location patient: home Location nurse: work Persons participating in the telephone visit: patient, nurse.   I discussed the limitations, risks, security and privacy concerns of performing an evaluation and management service by telephone and the availability of in person appointments. I also discussed with the patient that there may be a patient responsible charge related to this service. The patient expressed understanding and verbally consented to this telephonic visit.              Objective:    There were no vitals filed for this visit. There is no height or weight on file to calculate BMI.  Advanced Directives 07/27/2019 07/22/2018 07/05/2014  Does Patient Have a Medical Advance Directive? Yes No Yes  Type of Paramedic of [place];Living will - [place];Living will  Copy of [place] in Chart? No - copy requested - -  Would patient like information on creating a medical advance directive? - No - Patient declined -    Current Medications (verified) Outpatient Encounter Medications as of 08/23/2020  Medication Sig  . ACCU-CHEK SOFTCLIX LANCETS lancets Check blood sugar once daily and as instructed. Dx E11.65  . amLODipine (NORVASC) 10 MG tablet TAKE 1 TABLET BY MOUTH ONCE DAILY.  Marland Kitchen aspirin 81 MG tablet Take 1 tablet (81 mg total) by mouth daily.  Marland Kitchen atorvastatin (LIPITOR) 80 MG tablet TAKE 1 TABLET(80 MG) BY MOUTH DAILY  . Blood Glucose Monitoring Suppl (ACCU-CHEK AVIVA PLUS) w/Device KIT Check blood sugar once daily and as instructed. Dx E11.65  . calcium carbonate (TUMS - DOSED IN MG ELEMENTAL CALCIUM) 500 MG chewable tablet Chew 1 tablet by  mouth daily as needed for indigestion or heartburn.  . fish oil-omega-3 fatty acids 1000 MG capsule Take 2 g by mouth daily.  Marland Kitchen glucose blood (ACCU-CHEK AVIVA PLUS) test strip Check blood sugar once daily and as instructed. Dx E11.65  . losartan (COZAAR) 100 MG tablet TAKE 1 TABLET(100 MG) BY MOUTH DAILY  . metFORMIN (GLUCOPHAGE-XR) 500 MG 24 hr tablet TAKE 4 TABLETS BY MOUTH EVERY DAY WITH BREAKFAST  . metoprolol tartrate (LOPRESSOR) 50 MG tablet TAKE 1 TABLET(50 MG) BY MOUTH TWICE DAILY  . Multiple Vitamin (MULTIVITAMIN) tablet Take 1 tablet by mouth daily.   No facility-administered encounter medications on file as of 08/23/2020.    Allergies (verified) Sulfonamide derivatives   History: Past Medical History:  Diagnosis Date  . Alcohol abuse, unspecified   . CAD (coronary artery disease) 9/06   CABG x4  . Colon polyps   . Depression   . DMII (diabetes mellitus, type 2) ([place]) 10/03  . HLD (hyperlipidemia) 10/03  . HTN (hypertension) 10/03   Past Surgical History:  Procedure Laterality Date  . COLONOSCOPY    . CORONARY ARTERY BYPASS GRAFT  9/06   x4  . ESOPHAGOGASTRODUODENOSCOPY  7/07   polyp; mucosal abnml  . TONSILLECTOMY     Family History  Problem Relation Age of Onset  . Alcohol abuse Father   . Stroke Father   . Other Father        cardiac problems  . Heart disease Father   . COPD Mother   . Alcohol abuse Brother   .  Cirrhosis Brother        EtOH  . Anxiety disorder Brother   . Heart disease Paternal Grandmother   . Colon cancer Neg Hx   . Esophageal cancer Neg Hx   . Rectal cancer Neg Hx   . Stomach cancer Neg Hx    Social History   Socioeconomic History  . Marital status: Significant Other    Spouse name: Not on file  . Number of children: Not on file  . Years of education: Not on file  . Highest education level: Not on file  Occupational History  . Not on file  Tobacco Use  . Smoking status: Former Research scientist (life sciences)  . Smokeless tobacco: Never Used  .  Tobacco comment: quit 41 years ago  Vaping Use  . Vaping Use: Never used  Substance and Sexual Activity  . Alcohol use: Yes    Alcohol/week: 17.0 standard drinks    Types: 2 Glasses of wine, 15 Shots of liquor per week    Comment: 2 glasses of wine or 3-4 shots of bourbon daily  . Drug use: No  . Sexual activity: Not on file  Other Topics Concern  . Not on file  Social History Narrative   Wife Eustaquio Maize is deceased   He is retired.    Enjoys firearms and recreational shooting.    Social Determinants of Health   Financial Resource Strain: Not on file  Food Insecurity: Not on file  Transportation Needs: Not on file  Physical Activity: Not on file  Stress: Not on file  Social Connections: Not on file    Tobacco Counseling Counseling given: Not Answered Comment: quit 41 years ago   Clinical Intake:                 Diabetic: Yes Nutrition Risk Assessment:   Has the patient had any N/V/D within the last 2 months?  {YES/NO:21197} Does the patient have any non-healing wounds?  {YES/NO:21197} Has the patient had any unintentional weight loss or weight gain?  {YES/NO:21197}  Diabetes:  Is the patient diabetic?  Yes  If diabetic, was a CBG obtained today?  No  telephone visit Did the patient bring in their glucometer from home?  No  telephone visit  How often do you monitor your CBG's? ***.   Financial Strains and Diabetes Management:  Are you having any financial strains with the device, your supplies or your medication? No .  Does the patient want to be seen by Chronic Care Management for management of their diabetes?  No  Would the patient like to be referred to a Nutritionist or for Diabetic Management?  No   Diabetic Exams:  Diabetic Eye Exam: Completed 10/16/2019 Diabetic Foot Exam: Overdue, Pt has been advised about the importance in completing this exam.           Activities of Daily Living In your present state of health, do you have any difficulty  performing the following activities: 08/04/2020  Hearing? N  Vision? N  Difficulty concentrating or making decisions? N  Walking or climbing stairs? N  Dressing or bathing? N  Doing errands, shopping? N  Some recent data might be hidden    Patient Care Team: [name], MD as PCP - General Rockey Situ, Kathlene November, MD as PCP - Cardiology (Cardiology) Minna Merritts, MD as Consulting Physician (Cardiology)  Indicate any recent Medical Services you may have received from other than [facility] providers in the past year (date may be approximate).  Assessment:   This is a routine wellness examination for [facility].  Hearing/Vision screen No exam data present  Dietary issues and exercise activities discussed:    Goals    . Patient Stated     Starting 07/21/2018, I will continue to take medications as prescribed.     . Patient Stated     07/27/2019, I will maintain and continue medications as prescribed.      Depression Screen PHQ 2/9 Scores 07/27/2019 07/21/2018 06/13/2017 06/06/2016 05/30/2015 05/24/2014  PHQ - 2 Score 2 0 0 0 0 0  PHQ- 9 Score 2 0 - - - -    Fall Risk Fall Risk  08/04/2020 07/27/2019 07/21/2018 06/13/2017 06/06/2016  Falls in the past year? 0 0 0 No No  Number falls in past yr: - 0 - - -  Injury with Fall? - 0 - - -  Risk for fall due to : No Fall Risks Medication side effect - - -  Follow up - Falls evaluation completed;Falls prevention discussed - - -    FALL RISK PREVENTION PERTAINING TO THE HOME:  Any stairs in or around the home? Yes  If so, are there any without handrails? No  Home free of loose throw rugs in walkways, pet beds, electrical cords, etc? Yes  Adequate lighting in your home to reduce risk of falls? Yes   ASSISTIVE DEVICES UTILIZED TO PREVENT FALLS:  Life alert? {YES/NO:21197} Use of a cane, walker or w/c? {YES/NO:21197} Grab bars in the bathroom? {YES/NO:21197} Shower chair or bench in shower? {YES/NO:21197} Elevated toilet seat or  a handicapped toilet? {YES/NO:21197}  TIMED UP AND GO:  Was the test performed? N/A, telephone visit.    Cognitive Function: MMSE - Mini Mental State Exam 07/27/2019 07/21/2018  Orientation to time 5 5  Orientation to Place 5 5  Registration 3 3  Attention/ Calculation 3 0  Recall 3 1  Recall-comments - unable to recall 2 of 3 words  Language- name 2 objects - 0  Language- repeat 1 1  Language- follow 3 step command - 3  Language- read & follow direction - 0  Write a sentence - 0  Copy design - 0  Total score - 18  Mini Cog  Mini-Cog screen was completed. Maximum score is 22. A value of 0 denotes this part of the MMSE was not completed or the patient failed this part of the Mini-Cog screening.       Immunizations Immunization History  Administered Date(s) Administered  . Fluad Quad(high Dose 65+) 05/27/2020  . Influenza Split 08/02/2011, 06/11/2012  . Influenza Whole 08/10/2010  . Influenza, High Dose Seasonal PF 05/18/2014  . Influenza,inj,Quad PF,6+ Mos 05/25/2013, 05/30/2015, 06/13/2017  . Influenza-Unspecified 05/16/2016  . Moderna Sars-Covid-2 Vaccination 11/17/2019, 12/15/2019  . Pneumococcal Conjugate-13 05/30/2015  . Pneumococcal Polysaccharide-23 10/11/2011  . Td 08/20/1993, 04/12/2009    TDAP status: Due, Education has been provided regarding the importance of this vaccine. Advised may receive this vaccine at local pharmacy or Health Dept. Aware to provide a copy of the vaccination record if obtained from local pharmacy or Health Dept. Verbalized acceptance and understanding.  Flu Vaccine status: Up to date  Pneumococcal vaccine status: Up to date  {Covid-19 vaccine status:2101808}  Qualifies for Shingles Vaccine? Yes   Zostavax completed No   Shingrix Completed?: No.    Education has been provided regarding the importance of this vaccine. Patient has been advised to call insurance company to determine out of pocket expense if they have  not yet received  this vaccine. Advised may also receive vaccine at local pharmacy or Health Dept. Verbalized acceptance and understanding.  Screening Tests Health Maintenance  Topic Date Due  . FOOT EXAM  05/30/2016  . TETANUS/TDAP  04/13/2019  . COVID-19 Vaccine (3 - Moderna risk 4-dose series) 01/12/2020  . OPHTHALMOLOGY EXAM  10/15/2020  . HEMOGLOBIN A1C  01/30/2021  . Fecal DNA (Cologuard)  08/29/2021  . INFLUENZA VACCINE  Completed  . Hepatitis C Screening  Completed  . PNA vac Low Risk Adult  Completed    Health Maintenance  Health Maintenance Due  Topic Date Due  . FOOT EXAM  05/30/2016  . TETANUS/TDAP  04/13/2019  . COVID-19 Vaccine (3 - Moderna risk 4-dose series) 01/12/2020    Colorectal cancer screening: Type of screening: Cologuard. Completed 08/29/2018. Repeat every 3 years  Lung Cancer Screening: (Low Dose CT Chest recommended if Age 61-80 years, 30 pack-year currently smoking OR have quit w/in 15years.) does not qualify.   Additional Screening:  Hepatitis C Screening: does qualify; Completed 06/04/2016  Vision Screening: Recommended annual ophthalmology exams for early detection of glaucoma and other disorders of the eye. Is the patient up to date with their annual eye exam?  {YES/NO:21197} Who is the provider or what is the name of the office in which the patient attends annual eye exams? *** If pt is not established with a provider, would they like to be referred to a provider to establish care? No .   Dental Screening: Recommended annual dental exams for proper oral hygiene  Community Resource Referral / Chronic Care Management: CRR required this visit?  No   CCM required this visit?  No      Plan:     I have personally reviewed and noted the following in the patient's chart:   . Medical and social history . Use of alcohol, tobacco or illicit drugs  . Current medications and supplements . Functional ability and status . Nutritional status . Physical  activity . Advanced directives . List of other physicians . Hospitalizations, surgeries, and ER visits in previous 12 months . Vitals . Screenings to include cognitive, depression, and falls . Referrals and appointments  In addition, I have reviewed and discussed with patient certain preventive protocols, quality metrics, and best practice recommendations. A written personalized care plan for preventive services as well as general preventive health recommendations were provided to patient.   Due to this being a telephonic visit, the after visit summary with patients personalized plan was offered to patient via office or my-chart. Patient preferred to pick up at office at next visit or via mychart.   Andrez Grime, LPN   02/21/517

## 2020-09-24 ENCOUNTER — Other Ambulatory Visit: Payer: Self-pay | Admitting: Family Medicine

## 2020-09-29 NOTE — Progress Notes (Unsigned)
Cardiology Office Note  Date:  09/30/2020   ID:  [name], DOB [birth year], MRN [id number]  PCP:  Gregory Loffler, MD   Chief Complaint  Patient presents with  . Follow-up    3 months    HPI:  Mr. [name] is a very pleasant 75 yo gentleman with remote history of  coronary artery disease, bypass surgery in 2006 x5 vessel  hyperlipidemia,  diabetes, hypertension,  alcohol daily, 2-4 a day who presents for routine followup of his coronary artery disease  Was told he has a sulfa allergy HCTZ was declined by pharmacy  Slow urination, has to sit at night to urinate  He is trying to cut back on his alcohol intake Otherwise very active at baseline Does circles around his property Denies any chest pain or shortness of breath on exertion  Lab work reviewed  total chol 119,LDL 55 HBA1C 6.6   Daily alcohol, discussed with him in detail 2-4 drinks every night, some shots, wine, mixers  EKG personally reviewed by myself on todays visit  showing sinus bradycardia, rate 59 bpm, no significant ST or T-wave changes  Other past medical history  labs dated 05/18/2014 showing total cholesterol 126, LDL 62, hemoglobin A1c 7.0 He reports that he is alternating Lipitor 80 mg with 40 mg   He has not had a cardiac catheterization since his bypass surgery   PMH:   has a past medical history of Alcohol abuse, unspecified, CAD (coronary artery disease) (9/06), Colon polyps, Depression, DMII (diabetes mellitus, type 2) ([name]) (10/03), HLD (hyperlipidemia) (10/03), and HTN (hypertension) (10/03).  PSH:    Past Surgical History:  Procedure Laterality Date  . COLONOSCOPY    . CORONARY ARTERY BYPASS GRAFT  9/06   x4  . ESOPHAGOGASTRODUODENOSCOPY  7/07   polyp; mucosal abnml  . TONSILLECTOMY      Current Outpatient Medications  Medication Sig Dispense Refill  . ACCU-CHEK SOFTCLIX LANCETS lancets Check blood sugar once daily and as instructed. Dx E11.65 100 each 3  . amLODipine  (NORVASC) 10 MG tablet TAKE 1 TABLET BY MOUTH ONCE DAILY. 180 tablet 2  . aspirin 81 MG tablet Take 1 tablet (81 mg total) by mouth daily.    Marland Kitchen atorvastatin (LIPITOR) 80 MG tablet TAKE 1 TABLET(80 MG) BY MOUTH DAILY 90 tablet 1  . Blood Glucose Monitoring Suppl (ACCU-CHEK AVIVA PLUS) w/Device KIT Check blood sugar once daily and as instructed. Dx E11.65 1 kit 0  . calcium carbonate (TUMS - DOSED IN MG ELEMENTAL CALCIUM) 500 MG chewable tablet Chew 1 tablet by mouth daily as needed for indigestion or heartburn.    . fish oil-omega-3 fatty acids 1000 MG capsule Take 2 g by mouth daily.    Marland Kitchen glucose blood (ACCU-CHEK AVIVA PLUS) test strip Check blood sugar once daily and as instructed. Dx E11.65 100 each 3  . losartan (COZAAR) 100 MG tablet TAKE 1 TABLET(100 MG) BY MOUTH DAILY 90 tablet 3  . metFORMIN (GLUCOPHAGE-XR) 500 MG 24 hr tablet TAKE 4 TABLETS BY MOUTH EVERY DAY WITH BREAKFAST 360 tablet 3  . metoprolol tartrate (LOPRESSOR) 50 MG tablet TAKE 1 TABLET(50 MG) BY MOUTH TWICE DAILY 180 tablet 3  . Multiple Vitamin (MULTIVITAMIN) tablet Take 1 tablet by mouth daily.     No current facility-administered medications for this visit.     Allergies:   Sulfonamide derivatives   Social History:  The patient  reports that he has quit smoking. He has never used smokeless tobacco. He reports  current alcohol use of about 17.0 standard drinks of alcohol per week. He reports that he does not use drugs.   Family History:   family history includes Alcohol abuse in his brother and father; Anxiety disorder in his brother; COPD in his mother; Cirrhosis in his brother; Heart disease in his father and paternal grandmother; Other in his father; Stroke in his father.    Review of Systems: Review of Systems  Constitutional: Negative.   Respiratory: Negative.   Cardiovascular: Negative.   Gastrointestinal: Negative.   Musculoskeletal: Positive for joint pain.       Right shoulder pain  Neurological: Negative.    Psychiatric/Behavioral: Negative.   All other systems reviewed and are negative.   PHYSICAL EXAM: VS:  BP (!) 158/82   Pulse (!) 55   Ht 5' 6"  (1.676 m)   Wt 163 lb (73.9 kg)   BMI 26.31 kg/m  , BMI Body mass index is 26.31 kg/m.  Constitutional:  oriented to person, place, and time. No distress.  HENT:  Head: Grossly normal Eyes:  no discharge. No scleral icterus.  Neck: No JVD, no carotid bruits  Cardiovascular: Regular rate and rhythm, 1/6 RSB Pulmonary/Chest: Clear to auscultation bilaterally, no wheezes or rails Abdominal: Soft.  no distension.  no tenderness.  Musculoskeletal: Normal range of motion Neurological:  normal muscle tone. Coordination normal. No atrophy Skin: Skin warm and dry Psychiatric: normal affect, pleasant  Recent Labs: 08/01/2020: ALT 32; BUN 16; Creatinine, Ser 1.12; Hemoglobin 15.2; Platelets 168.0; Potassium 4.3; Sodium 141    Lipid Panel Lab Results  Component Value Date   CHOL 128 08/01/2020   HDL 36.40 (L) 08/01/2020   LDLCALC 55 08/01/2020   TRIG 185.0 (H) 08/01/2020      Wt Readings from Last 3 Encounters:  09/30/20 163 lb (73.9 kg)  08/04/20 163 lb (73.9 kg)  06/29/20 164 lb (74.4 kg)     ASSESSMENT AND PLAN:   Coronary artery disease involving autologous vein coronary bypass graft without angina pectoris - Plan: EKG 12-Lead 2006 bypass surgery, Non-smoker, cholesterol at goal Stable Discussed anginal symptoms to watch for  Essential hypertension - Plan: EKG 12-Lead Blood pressure elevated by his details from home, numerous numbers checked over the past several months He does report some urinary flow issues, will start Cardura/doxazosin 1 mg daily This could be titrated upwards as needed  Hyperlipidemia LDL goal <70 Cholesterol is at goal on the current lipid regimen. No changes to the medications were made.  controlled type 2 diabetes mellitus with other circulatory complication, without long-term current use of  insulin (HCC) Stable numbers, followed by primary care  Alcohol abuse Long discussion with him, suggested he moderate his alcohol, cessation would be best  GERD Denies any significant GERD symptoms Symptoms stable  Smoker We have encouraged him to continue to work on weaning his cigarettes and smoking cessation. He will continue to work on this and does not want any assistance with chantix.    Total encounter time more than 25 minutes  Greater than 50% was spent in counseling and coordination of care with the patient     No orders of the defined types were placed in this encounter.    Signed, Esmond Plants, M.D., Ph.D. 09/30/2020  [place], [place]

## 2020-09-30 ENCOUNTER — Other Ambulatory Visit: Payer: Self-pay

## 2020-09-30 ENCOUNTER — Encounter: Payer: Self-pay | Admitting: Cardiovascular Disease

## 2020-09-30 ENCOUNTER — Ambulatory Visit: Payer: Medicare HMO | Admitting: Cardiovascular Disease

## 2020-09-30 VITALS — BP 158/82 | HR 55 | Ht 66.0 in | Wt 163.0 lb

## 2020-09-30 DIAGNOSIS — E1165 Type 2 diabetes mellitus with hyperglycemia: Secondary | ICD-10-CM | POA: Diagnosis not present

## 2020-09-30 DIAGNOSIS — E782 Mixed hyperlipidemia: Secondary | ICD-10-CM | POA: Diagnosis not present

## 2020-09-30 DIAGNOSIS — I25118 Atherosclerotic heart disease of native coronary artery with other forms of angina pectoris: Secondary | ICD-10-CM

## 2020-09-30 DIAGNOSIS — I10 Essential (primary) hypertension: Secondary | ICD-10-CM

## 2020-09-30 MED ORDER — DOXAZOSIN MESYLATE 1 MG PO TABS: 1.0000 mg | ORAL_TABLET | Freq: Every day | ORAL | 6 refills | Status: DC

## 2020-09-30 NOTE — Patient Instructions (Addendum)
Medication Instructions:  Please start cardura/dozazosin 1mg  daily Monitor blood pressure  If you need a refill on your cardiac medications before your next appointment, please call your pharmacy.    Lab work: No new labs needed   If you have labs (blood work) drawn today and your tests are completely normal, you will receive your results only by: Marland Kitchen MyChart Message (if you have MyChart) OR . A paper copy in the mail If you have any lab test that is abnormal or we need to change your treatment, we will call you to review the results.   Testing/Procedures: No new testing needed   Follow-Up: At [facility], you and your health needs are our priority.  As part of our continuing mission to provide you with exceptional heart care, we have created designated Provider Care Teams.  These Care Teams include your primary Cardiologist (physician) and Advanced Practice Providers (APPs -  Physician Assistants and Nurse Practitioners) who all work together to provide you with the care you need, when you need it.  . You will need a follow up appointment in 6 months  . Providers on your designated Care Team:   . Murray Hodgkins, NP . Christell Faith, PA-C . Marrianne Mood, PA-C  Any Other Special Instructions Will Be Listed Below (If Applicable).  COVID-19 Vaccine Information can be found at: ShippingScam.co.uk For questions related to vaccine distribution or appointments, please email [email or web address] .[email or web address] or call [phone].

## 2020-11-18 ENCOUNTER — Telehealth: Payer: Self-pay

## 2020-11-18 ENCOUNTER — Other Ambulatory Visit: Payer: Self-pay

## 2020-11-18 ENCOUNTER — Ambulatory Visit: Payer: Medicare HMO

## 2020-11-18 NOTE — Telephone Encounter (Signed)
Called patient 3 times trying to complete his AWV. Patient never answered. Left message on voicemail that I called and to call and reschedule appointment at his convenience.

## 2021-02-06 ENCOUNTER — Ambulatory Visit (INDEPENDENT_AMBULATORY_CARE_PROVIDER_SITE_OTHER): Payer: Medicare HMO | Admitting: Family Medicine

## 2021-02-06 ENCOUNTER — Encounter: Payer: Self-pay | Admitting: Family Medicine

## 2021-02-06 ENCOUNTER — Other Ambulatory Visit: Payer: Self-pay

## 2021-02-06 VITALS — BP 124/60 | HR 56 | Temp 98.4°F | Ht 66.0 in | Wt 172.5 lb

## 2021-02-06 DIAGNOSIS — I10 Essential (primary) hypertension: Secondary | ICD-10-CM

## 2021-02-06 DIAGNOSIS — E1165 Type 2 diabetes mellitus with hyperglycemia: Secondary | ICD-10-CM

## 2021-02-06 LAB — POCT GLYCOSYLATED HEMOGLOBIN (HGB A1C): Hemoglobin A1C: 6.7 % — AB (ref 4.0–5.6)

## 2021-02-06 NOTE — Progress Notes (Signed)
Alyx Mcguirk T. Manoj Enriquez, MD, [place] at [facility] [place], [address]  Phone: [phone]  FAX: [phone]  [name] - 75 y.o. male  MRN [id number]  Date of Birth: [birth year]  Date: 02/06/2021  PCP: [name], MD  Referral: [name], MD  Chief Complaint  Patient presents with   Diabetes    This visit occurred during the SARS-CoV-2 public health emergency.  Safety protocols were in place, including screening questions prior to the visit, additional usage of staff PPE, and extensive cleaning of exam room while observing appropriate contact time as indicated for disinfecting solutions.   Subjective:   [name] is a 75 y.o. very pleasant male patient with Body mass index is 27.84 kg/m. who presents with the following:  Cut back a lot on his alcohol. Dr. Rockey Situ did start on some Cardura.   Diabetes Mellitus: Tolerating Medications: yes Compliance with diet: fair, Body mass index is 27.84 kg/m. Exercise: minimal / intermittent Avg blood sugars at home: not checking Foot problems: none Hypoglycemia: none No nausea, vomitting, blurred vision, polyuria.  Lab Results  Component Value Date   HGBA1C 6.7 (A) 02/06/2021   HGBA1C 6.6 (H) 08/01/2020   HGBA1C 6.7 (H) 07/29/2019   Lab Results  Component Value Date   MICROALBUR 1.4 08/01/2020   LDLCALC 55 08/01/2020   CREATININE 1.12 08/01/2020    Wt Readings from Last 3 Encounters:  02/06/21 172 lb 8 oz (78.2 kg)  09/30/20 163 lb (73.9 kg)  08/04/20 163 lb (73.9 kg)    HTN: Tolerating all medications without side effects Stable and at goal No CP, no sob. No HA.  BP Readings from Last 3 Encounters:  02/06/21 124/60  09/30/20 (!) 158/82  08/04/20 (!) 561/53    Basic Metabolic Panel:    Component Value Date/Time   NA 141 08/01/2020 0912   K 4.3 08/01/2020 0912   CL 102 08/01/2020 0912   CO2 31 08/01/2020 0912   BUN 16  08/01/2020 0912   CREATININE 1.12 08/01/2020 0912   GLUCOSE 131 (H) 08/01/2020 0912   CALCIUM 10.0 08/01/2020 0912     Review of Systems is noted in the HPI, as appropriate  Objective:   BP 124/60   Pulse (!) 56   Temp 98.4 F (36.9 C) (Temporal)   Ht 5' 6"  (1.676 m)   Wt 172 lb 8 oz (78.2 kg)   SpO2 97%   BMI 27.84 kg/m   GEN: No acute distress; alert,appropriate. PULM: Breathing comfortably in no respiratory distress PSYCH: Normally interactive.  CV: RRR, no m/g/r   Laboratory and Imaging Data: Results for orders placed or performed in visit on 02/06/21  POCT glycosylated hemoglobin (Hb A1C)  Result Value Ref Range   Hemoglobin A1C 6.7 (A) 4.0 - 5.6 %   HbA1c POC (<> result, manual entry)     HbA1c, POC (prediabetic range)     HbA1c, POC (controlled diabetic range)       Assessment and Plan:     ICD-10-CM   1. Uncontrolled type 2 diabetes mellitus with hyperglycemia (HCC)  E11.65 POCT glycosylated hemoglobin (Hb A1C)    2. Essential hypertension  I10      His diabetes is stable.  No medication changes.  Hypertension is much more stable.  Continue with no changes.  I congratulated him on decreasing his alcohol intake.  Orders Placed This Encounter  Procedures   POCT glycosylated hemoglobin (Hb  A1C)    Follow-up: Return in about 6 months (around 08/08/2021) for Healthcare maintenance close to [place].  Signed,  Gregory Deed. Neenah Canter, MD   Outpatient Encounter Medications as of 02/06/2021  Medication Sig   ACCU-CHEK SOFTCLIX LANCETS lancets Check blood sugar once daily and as instructed. Dx E11.65   amLODipine (NORVASC) 10 MG tablet TAKE 1 TABLET BY MOUTH ONCE DAILY.   aspirin 81 MG tablet Take 1 tablet (81 mg total) by mouth daily.   atorvastatin (LIPITOR) 80 MG tablet TAKE 1 TABLET(80 MG) BY MOUTH DAILY   Blood Glucose Monitoring Suppl (ACCU-CHEK AVIVA PLUS) w/Device KIT Check blood sugar once daily and as instructed. Dx E11.65   calcium carbonate  (TUMS - DOSED IN MG ELEMENTAL CALCIUM) 500 MG chewable tablet Chew 1 tablet by mouth daily as needed for indigestion or heartburn.   doxazosin (CARDURA) 1 MG tablet Take 1 tablet (1 mg total) by mouth daily.   fish oil-omega-3 fatty acids 1000 MG capsule Take 2 g by mouth daily.   glucose blood (ACCU-CHEK AVIVA PLUS) test strip Check blood sugar once daily and as instructed. Dx E11.65   losartan (COZAAR) 100 MG tablet TAKE 1 TABLET(100 MG) BY MOUTH DAILY   metFORMIN (GLUCOPHAGE-XR) 500 MG 24 hr tablet TAKE 4 TABLETS BY MOUTH EVERY DAY WITH BREAKFAST   metoprolol tartrate (LOPRESSOR) 50 MG tablet TAKE 1 TABLET(50 MG) BY MOUTH TWICE DAILY   Multiple Vitamin (MULTIVITAMIN) tablet Take 1 tablet by mouth daily.   No facility-administered encounter medications on file as of 02/06/2021.

## 2021-02-06 NOTE — Patient Instructions (Signed)
Get some generic fluticasone (nasal spray) 2 sprays in each nostril daily -  try it out for month.

## 2021-02-07 ENCOUNTER — Other Ambulatory Visit: Payer: Self-pay | Admitting: Cardiovascular Disease

## 2021-03-15 ENCOUNTER — Other Ambulatory Visit: Payer: Self-pay | Admitting: Family Medicine

## 2021-04-03 ENCOUNTER — Other Ambulatory Visit: Payer: Self-pay | Admitting: *Deleted

## 2021-04-03 MED ORDER — METOPROLOL TARTRATE 50 MG PO TABS: ORAL_TABLET | ORAL | 0 refills | Status: DC

## 2021-04-03 NOTE — Progress Notes (Signed)
Cardiology Office Note  Date:  04/04/2021   ID:  [name], DOB 02-26-1946, MRN [id number]  PCP:  [name], MD   Chief Complaint  Patient presents with   6 month follow up     Patient c/o irregular heartbeats during the night. Medications reviewed by the patient verblaly.    HPI:  [name] is a very pleasant 75 yo gentleman with remote history of  coronary artery disease, bypass surgery in 2006 x5 vessel  hyperlipidemia,  diabetes, hypertension,  alcohol daily, 2-4 a day who presents for routine followup of his coronary artery disease  LOV 09/2020 In follow-up today reports he is doing well BP elevated today 532 systolic, was rushing , Recheck blood pressure down to 992 systolic At home: 426 systolic before meds Later in morning 128/78 after meds  2 glasses of wine per night Active, does gardening at home Walks around his property Denies any chest pain or shortness of breath on exertion  No chest pain concerning for angina  Lab work reviewed  total chol 128, LDL 55 HBA1C 6.7  EKG personally reviewed by myself on todays visit  showing sinus bradycardia, rate 54 bpm, no significant ST or T-wave changes   Other past medical history  labs dated 05/18/2014 showing total cholesterol 126, LDL 62, hemoglobin A1c 7.0 He reports that he is alternating Lipitor 80 mg with 40 mg    He has not had a cardiac catheterization since his bypass surgery   PMH:   has a past medical history of Alcohol abuse, unspecified, CAD (coronary artery disease) (9/06), Colon polyps, Depression, DMII (diabetes mellitus, type 2) ([place]) (10/03), HLD (hyperlipidemia) (10/03), and HTN (hypertension) (10/03).  PSH:    Past Surgical History:  Procedure Laterality Date   COLONOSCOPY     CORONARY ARTERY BYPASS GRAFT  9/06   x4   ESOPHAGOGASTRODUODENOSCOPY  7/07   polyp; mucosal abnml   TONSILLECTOMY      Current Outpatient Medications  Medication Sig Dispense Refill   ACCU-CHEK  SOFTCLIX LANCETS lancets Check blood sugar once daily and as instructed. Dx E11.65 100 each 3   amLODipine (NORVASC) 10 MG tablet TAKE 1 TABLET BY MOUTH ONCE DAILY. 90 tablet 0   aspirin 81 MG tablet Take 1 tablet (81 mg total) by mouth daily.     atorvastatin (LIPITOR) 80 MG tablet TAKE 1 TABLET(80 MG) BY MOUTH DAILY 90 tablet 1   Blood Glucose Monitoring Suppl (ACCU-CHEK AVIVA PLUS) w/Device KIT Check blood sugar once daily and as instructed. Dx E11.65 1 kit 0   calcium carbonate (TUMS - DOSED IN MG ELEMENTAL CALCIUM) 500 MG chewable tablet Chew 1 tablet by mouth daily as needed for indigestion or heartburn.     doxazosin (CARDURA) 1 MG tablet Take 1 tablet (1 mg total) by mouth daily. 30 tablet 6   fish oil-omega-3 fatty acids 1000 MG capsule Take 2 g by mouth daily.     glucose blood (ACCU-CHEK AVIVA PLUS) test strip Check blood sugar once daily and as instructed. Dx E11.65 100 each 3   losartan (COZAAR) 100 MG tablet TAKE 1 TABLET(100 MG) BY MOUTH DAILY 90 tablet 3   metFORMIN (GLUCOPHAGE-XR) 500 MG 24 hr tablet TAKE 4 TABLETS BY MOUTH EVERY DAY WITH BREAKFAST 360 tablet 3   metoprolol tartrate (LOPRESSOR) 50 MG tablet TAKE 1 TABLET(50 MG) BY MOUTH TWICE DAILY 180 tablet 0   Multiple Vitamin (MULTIVITAMIN) tablet Take 1 tablet by mouth daily.     No  current facility-administered medications for this visit.     Allergies:   Sulfonamide derivatives   Social History:  The patient  reports that he has quit smoking. He has never used smokeless tobacco. He reports current alcohol use of about 17.0 standard drinks per week. He reports that he does not use drugs.   Family History:   family history includes Alcohol abuse in his brother and father; Anxiety disorder in his brother; COPD in his mother; Cirrhosis in his brother; Heart disease in his father and paternal grandmother; Other in his father; Stroke in his father.    Review of Systems: Review of Systems  Constitutional: Negative.    Respiratory: Negative.    Cardiovascular: Negative.   Gastrointestinal: Negative.   Musculoskeletal: Negative.   Neurological: Negative.   Psychiatric/Behavioral: Negative.    All other systems reviewed and are negative.  PHYSICAL EXAM: VS:  BP (!) 160/70 (BP Location: Left Arm, Patient Position: Sitting, Cuff Size: Normal)   Pulse (!) 54   Ht 5' 7"  (1.702 m)   Wt 174 lb 6 oz (79.1 kg)   SpO2 98%   BMI 27.31 kg/m  , BMI Body mass index is 27.31 kg/m.  Constitutional:  oriented to person, place, and time. No distress.  HENT:  Head: Grossly normal Eyes:  no discharge. No scleral icterus.  Neck: No JVD, no carotid bruits  Cardiovascular: Regular rate and rhythm, no murmurs appreciated Pulmonary/Chest: Clear to auscultation bilaterally, no wheezes or rails Abdominal: Soft.  no distension.  no tenderness.  Musculoskeletal: Normal range of motion Neurological:  normal muscle tone. Coordination normal. No atrophy Skin: Skin warm and dry Psychiatric: normal affect, pleasant  Recent Labs: 08/01/2020: ALT 32; BUN 16; Creatinine, Ser 1.12; Hemoglobin 15.2; Platelets 168.0; Potassium 4.3; Sodium 141    Lipid Panel Lab Results  Component Value Date   CHOL 128 08/01/2020   HDL 36.40 (L) 08/01/2020   LDLCALC 55 08/01/2020   TRIG 185.0 (H) 08/01/2020      Wt Readings from Last 3 Encounters:  04/04/21 174 lb 6 oz (79.1 kg)  02/06/21 172 lb 8 oz (78.2 kg)  09/30/20 163 lb (73.9 kg)     ASSESSMENT AND PLAN:   Coronary artery disease involving autologous vein coronary bypass graft without angina pectoris - Plan: EKG 12-Lead 2006 bypass surgery, Non-smoker, cholesterol at goal Currently with no symptoms of angina. No further workup at this time. Continue current medication regimen.  Essential hypertension - Plan: EKG 12-Lead BP elevated here, better at home  Hyperlipidemia LDL goal <70 Cholesterol is at goal on the current lipid regimen. No changes to the medications were  made.  controlled type 2 diabetes mellitus with other circulatory complication, without long-term current use of insulin (HCC) Stable numbers, weight stable  Alcohol abuse Long discussion with him, suggested he moderate his alcohol  GERD stable  Smoker quit   Total encounter time more than 25 minutes  Greater than 50% was spent in counseling and coordination of care with the patient     No orders of the defined types were placed in this encounter.    Signed, Esmond Plants, M.D., Ph.D. 04/04/2021  [place], [place]

## 2021-04-04 ENCOUNTER — Encounter: Payer: Self-pay | Admitting: Cardiovascular Disease

## 2021-04-04 ENCOUNTER — Other Ambulatory Visit: Payer: Self-pay

## 2021-04-04 ENCOUNTER — Ambulatory Visit: Payer: Medicare HMO | Admitting: Cardiovascular Disease

## 2021-04-04 VITALS — BP 135/70 | HR 54 | Ht 67.0 in | Wt 174.4 lb

## 2021-04-04 DIAGNOSIS — E1165 Type 2 diabetes mellitus with hyperglycemia: Secondary | ICD-10-CM | POA: Diagnosis not present

## 2021-04-04 DIAGNOSIS — I25118 Atherosclerotic heart disease of native coronary artery with other forms of angina pectoris: Secondary | ICD-10-CM

## 2021-04-04 DIAGNOSIS — E782 Mixed hyperlipidemia: Secondary | ICD-10-CM

## 2021-04-04 DIAGNOSIS — I10 Essential (primary) hypertension: Secondary | ICD-10-CM

## 2021-04-04 MED ORDER — METOPROLOL TARTRATE 50 MG PO TABS: ORAL_TABLET | ORAL | 3 refills | Status: DC

## 2021-04-04 MED ORDER — LOSARTAN POTASSIUM 100 MG PO TABS: ORAL_TABLET | ORAL | 3 refills | Status: DC

## 2021-04-04 MED ORDER — ATORVASTATIN CALCIUM 80 MG PO TABS: ORAL_TABLET | ORAL | 3 refills | Status: DC

## 2021-04-04 MED ORDER — AMLODIPINE BESYLATE 10 MG PO TABS: 10.0000 mg | ORAL_TABLET | Freq: Every day | ORAL | 3 refills | Status: DC

## 2021-04-04 MED ORDER — DOXAZOSIN MESYLATE 1 MG PO TABS: 1.0000 mg | ORAL_TABLET | Freq: Every day | ORAL | 11 refills | Status: DC

## 2021-04-04 NOTE — Patient Instructions (Addendum)
Please monitor BP in the PM  [place] [phone] Assistance with finding a primary care provider within your area  The best would be to call around to different offices and ask if they are accepting new patients. Try calling: [facility] [phone] [employer] at [place] [employer] at [place] [facility] [phone] [place] [facility] in: [facility] [place] Address: [address] [place], [place] [address] Phone: [phone]   Medication Instructions:  No changes  If you need a refill on your cardiac medications before your next appointment, please call your pharmacy.    Lab work: No new labs needed  Testing/Procedures: No new testing needed  Follow-Up: At [facility], you and your health needs are our priority.  As part of our continuing mission to provide you with exceptional heart care, we have created designated Provider Care Teams.  These Care Teams include your primary Cardiologist (physician) and Advanced Practice Providers (APPs -  Physician Assistants and Nurse Practitioners) who all work together to provide you with the care you need, when you need it.  You will need a follow up appointment in 12 months  Providers on your designated Care Team:   Murray Hodgkins, NP Christell Faith, PA-C Marrianne Mood, PA-C Cadence Combs, [place]  COVID-19 Vaccine Information can be found at: ShippingScam.co.uk For questions related to vaccine distribution or appointments, please email [email or web address] or call [phone].

## 2021-04-27 ENCOUNTER — Other Ambulatory Visit: Payer: Self-pay | Admitting: Cardiovascular Disease

## 2021-04-27 NOTE — Telephone Encounter (Signed)
Rx resent to pharmacy, the prescription from last month was set to No Print therefore medication was not sent to pharmacy.

## 2021-05-16 ENCOUNTER — Telehealth: Payer: Self-pay | Admitting: Family Medicine

## 2021-05-16 NOTE — Telephone Encounter (Signed)
Pt came into office wanting to know if he could transfer care to [facility] because the pt is closer to [place] than he is to [employer]. Some of his family members sees Dr. Silvio Pate and he wants to se if he able to see him instead. Pt wants to know if the transfer can be made permanently. Please advise

## 2021-05-17 NOTE — Telephone Encounter (Signed)
[name] is a very nice guy.  He has been my patient for > 10 years, and I have always enjoyed working with him.

## 2021-05-29 ENCOUNTER — Other Ambulatory Visit: Payer: Self-pay | Admitting: Cardiovascular Disease

## 2021-07-17 ENCOUNTER — Other Ambulatory Visit: Payer: Self-pay | Admitting: Family Medicine

## 2021-07-31 NOTE — Progress Notes (Signed)
Subjective:   [name] is a 75 y.o. male who presents for Medicare Annual/Subsequent preventive examination.  I connected with [name] today by telephone and verified that I am speaking with the correct person using two identifiers. Location patient: home Location provider: work Persons participating in the virtual visit: patient, Marine scientist.    I discussed the limitations, risks, security and privacy concerns of performing an evaluation and management service by telephone and the availability of in person appointments. I also discussed with the patient that there may be a patient responsible charge related to this service. The patient expressed understanding and verbally consented to this telephonic visit.    Interactive audio and video telecommunications were attempted between this provider and patient, however failed, due to patient having technical difficulties OR patient did not have access to video capability.  We continued and completed visit with audio only.  Some vital signs may be absent or patient reported.   Time Spent with patient on telephone encounter: 30 minutes  Review of Systems     Cardiac Risk Factors include: advanced age (>55mn, >>28women);diabetes mellitus;dyslipidemia;hypertension     Objective:    Today's Vitals   08/02/21 1527  Weight: 174 lb (78.9 kg)   Body mass index is 27.25 kg/m.  Advanced Directives 08/02/2021 07/27/2019 07/22/2018 07/05/2014  Does Patient Have a Medical Advance Directive? Yes Yes No Yes  Type of Advance Directive [place]  Does patient want to make changes to medical advance directive? Yes (MAU/Ambulatory/Procedural Areas - Information given) - - -  Copy of [place] Chart? - No - copy requested - -  Would patient like information on creating a medical advance directive? - - No - Patient declined -    Current  Medications (verified) Outpatient Encounter Medications as of 08/02/2021  Medication Sig   ACCU-CHEK SOFTCLIX LANCETS lancets Check blood sugar once daily and as instructed. Dx E11.65   amLODipine (NORVASC) 10 MG tablet TAKE 1 TABLET BY MOUTH EVERY DAY   aspirin 81 MG tablet Take 1 tablet (81 mg total) by mouth daily.   atorvastatin (LIPITOR) 80 MG tablet TAKE 1 TABLET(80 MG) BY MOUTH DAILY   Blood Glucose Monitoring Suppl (ACCU-CHEK AVIVA PLUS) w/Device KIT Check blood sugar once daily and as instructed. Dx E11.65   calcium carbonate (TUMS - DOSED IN MG ELEMENTAL CALCIUM) 500 MG chewable tablet Chew 1 tablet by mouth daily as needed for indigestion or heartburn.   doxazosin (CARDURA) 1 MG tablet TAKE 1 TABLET(1 MG) BY MOUTH DAILY   fish oil-omega-3 fatty acids 1000 MG capsule Take 2 g by mouth daily.   glucose blood (ACCU-CHEK AVIVA PLUS) test strip Check blood sugar once daily and as instructed. Dx E11.65   losartan (COZAAR) 100 MG tablet TAKE 1 TABLET(100 MG) BY MOUTH DAILY   metFORMIN (GLUCOPHAGE-XR) 500 MG 24 hr tablet TAKE 4 TABLETS BY MOUTH EVERY DAY WITH BREAKFAST   metoprolol tartrate (LOPRESSOR) 50 MG tablet TAKE 1 TABLET(50 MG) BY MOUTH TWICE DAILY   Multiple Vitamin (MULTIVITAMIN) tablet Take 1 tablet by mouth daily.   No facility-administered encounter medications on file as of 08/02/2021.    Allergies (verified) Sulfonamide derivatives   History: Past Medical History:  Diagnosis Date   Alcohol abuse, unspecified    CAD (coronary artery disease) 9/06   CABG x4   Colon polyps    Depression    DMII (diabetes mellitus,  type 2) ([place]) 10/03   HLD (hyperlipidemia) 10/03   HTN (hypertension) 10/03   Past Surgical History:  Procedure Laterality Date   COLONOSCOPY     CORONARY ARTERY BYPASS GRAFT  9/06   x4   ESOPHAGOGASTRODUODENOSCOPY  7/07   polyp; mucosal abnml   TONSILLECTOMY     Family History  Problem Relation Age of Onset   Alcohol abuse Father    Stroke  Father    Other Father        cardiac problems   Heart disease Father    COPD Mother    Alcohol abuse Brother    Cirrhosis Brother        EtOH   Anxiety disorder Brother    Heart disease Paternal Grandmother    Colon cancer Neg Hx    Esophageal cancer Neg Hx    Rectal cancer Neg Hx    Stomach cancer Neg Hx    Social History   Socioeconomic History   Marital status: Significant Other    Spouse name: Not on file   Number of children: Not on file   Years of education: Not on file   Highest education level: Not on file  Occupational History   Not on file  Tobacco Use   Smoking status: Former   Smokeless tobacco: Never   Tobacco comments:    quit 41 years ago  Vaping Use   Vaping Use: Never used  Substance and Sexual Activity   Alcohol use: Yes    Alcohol/week: 17.0 standard drinks    Types: 2 Glasses of wine, 15 Shots of liquor per week    Comment: 2 glasses of wine or 3-4 shots of bourbon daily   Drug use: No   Sexual activity: Not on file  Other Topics Concern   Not on file  Social History Narrative   Wife Eustaquio Maize is deceased   He is retired.    Enjoys firearms and recreational shooting.    Social Determinants of Health   Financial Resource Strain: Low Risk    Difficulty of Paying Living Expenses: Not hard at all  Food Insecurity: No Food Insecurity   Worried About Charity fundraiser in the Last Year: Never true   [employer] in the Last Year: Never true  Transportation Needs: No Transportation Needs   Lack of Transportation (Medical): No   Lack of Transportation (Non-Medical): No  Physical Activity: Sufficiently Active   Days of Exercise per Week: 7 days   Minutes of Exercise per Session: 60 min  Stress: No Stress Concern Present   Feeling of Stress : Only a little  Social Connections: Engineer, building services of Communication with Friends and Family: Twice a week   Frequency of Social Gatherings with Friends and Family: More than three times a  week   Attends Religious Services: More than 4 times per year   Active Member of [employer] or Organizations: Yes   Attends Music therapist: More than 4 times per year   Marital Status: Living with partner    Tobacco Counseling Counseling given: Not Answered Tobacco comments: quit 41 years ago   Clinical Intake:  Pre-visit preparation completed: Yes  Pain : No/denies pain     BMI - recorded: 27.25 Nutritional Status: BMI 25 -29 Overweight Nutritional Risks: None Diabetes: Yes CBG done?: No Did pt. bring in CBG monitor from home?: No  How often do you need to have someone help you when you read instructions,  pamphlets, or other written materials from your doctor or pharmacy?: 1 - Never  Diabetes:  Is the patient diabetic?  Yes  If diabetic, was a CBG obtained today?  No , visit completed over the phone. Did the patient bring in their glucometer from home?  No , visit completed over the phone. How often do you monitor your CBG's? 3 times per week.   Financial Strains and Diabetes Management:  Are you having any financial strains with the device, your supplies or your medication? No .  Does the patient want to be seen by Chronic Care Management for management of their diabetes?  No  Would the patient like to be referred to a Nutritionist or for Diabetic Management?  No   Diabetic Exams:  Diabetic Eye Exam: Overdue for diabetic eye exam. Pt has been advised about the importance in completing this exam.   Diabetic Foot Exam:  Pt is scheduled for diabetic foot exam on 08/07/21.    Interpreter Needed?: No  Information entered by :: Orrin Brigham LPN   Activities of Daily Living In your present state of health, do you have any difficulty performing the following activities: 08/02/2021 08/04/2020  Hearing? N N  Vision? N N  Difficulty concentrating or making decisions? N N  Walking or climbing stairs? N N  Dressing or bathing? N N  Doing errands,  shopping? N N  Preparing Food and eating ? N -  Using the Toilet? N -  In the past six months, have you accidently leaked urine? Y -  Comment if holding urine too long -  Do you have problems with loss of bowel control? N -  Managing your Medications? N -  Managing your Finances? N -  Housekeeping or managing your Housekeeping? N -  Some recent data might be hidden    Patient Care Team: [name], MD as PCP - General Rockey Situ, Kathlene November, MD as PCP - Cardiology (Cardiology) Minna Merritts, MD as Consulting Physician (Cardiology)  Indicate any recent Medical Services you may have received from other than [facility] providers in the past year (date may be approximate).     Assessment:   This is a routine wellness examination for [facility].  Hearing/Vision screen Hearing Screening - Comments:: No issues Vision Screening - Comments:: Last eye exam last year, plans to make an appointment, [place] issues and exercise activities discussed: Current Exercise Habits: Home exercise routine, Type of exercise: walking, Time (Minutes): 60, Frequency (Times/Week): 7, Weekly Exercise (Minutes/Week): 420, Intensity: Moderate   Goals Addressed             This Visit's Progress    Patient Stated       Would like to maintain current routine.        Depression Screen PHQ 2/9 Scores 08/02/2021 07/27/2019 07/21/2018 06/13/2017 06/06/2016 05/30/2015 05/24/2014  PHQ - 2 Score 0 2 0 0 0 0 0  PHQ- 9 Score - 2 0 - - - -    Fall Risk Fall Risk  08/02/2021 08/04/2020 07/27/2019 07/21/2018 06/13/2017  Falls in the past year? 0 0 0 0 No  Number falls in past yr: 0 - 0 - -  Injury with Fall? 0 - 0 - -  Risk for fall due to : No Fall Risks No Fall Risks Medication side effect - -  Follow up Falls prevention discussed - Falls evaluation completed;Falls prevention discussed - -    FALL RISK PREVENTION PERTAINING TO THE HOME:  Any stairs in or around the home? Yes  If so, are  there any without handrails? No  Home free of loose throw rugs in walkways, pet beds, electrical cords, etc? No  Adequate lighting in your home to reduce risk of falls? Yes   ASSISTIVE DEVICES UTILIZED TO PREVENT FALLS:  Life alert? No  Use of a cane, walker or w/c? No  Grab bars in the bathroom? No  Shower chair or bench in shower? Yes  Elevated toilet seat or a handicapped toilet? No   TIMED UP AND GO:  Was the test performed? No , visit completed over the phone.   Cognitive Function: Normal cognitive status assessed by  this Nurse Health Advisor. No abnormalities found.   MMSE - Mini Mental State Exam 07/27/2019 07/21/2018  Orientation to time 5 5  Orientation to Place 5 5  Registration 3 3  Attention/ Calculation 3 0  Recall 3 1  Recall-comments - unable to recall 2 of 3 words  Language- name 2 objects - 0  Language- repeat 1 1  Language- follow 3 step command - 3  Language- read & follow direction - 0  Write a sentence - 0  Copy design - 0  Total score - 18        Immunizations Immunization History  Administered Date(s) Administered   Fluad Quad(high Dose 65+) 05/27/2020   Influenza Split 08/02/2011, 06/11/2012   Influenza Whole 08/10/2010   Influenza, High Dose Seasonal PF 05/18/2014   Influenza,inj,Quad PF,6+ Mos 05/25/2013, 05/30/2015, 06/13/2017   Influenza-Unspecified 05/16/2016   Moderna Sars-Covid-2 Vaccination 11/17/2019, 12/15/2019   Pneumococcal Conjugate-13 05/30/2015   Pneumococcal Polysaccharide-23 10/11/2011   Td 08/20/1993, 04/12/2009    TDAP status: Due, Education has been provided regarding the importance of this vaccine. Advised may receive this vaccine at local pharmacy or Health Dept. Aware to provide a copy of the vaccination record if obtained from local pharmacy or Health Dept. Verbalized acceptance and understanding.  Flu Vaccine status: Due, Education has been provided regarding the importance of this vaccine. Advised may receive this  vaccine at local pharmacy or Health Dept. Aware to provide a copy of the vaccination record if obtained from local pharmacy or Health Dept. Verbalized acceptance and understanding.  Pneumococcal vaccine status: Up to date  Covid-19 vaccine status: Declined, Education has been provided regarding the importance of this vaccine but patient still declined. Advised may receive this vaccine at local pharmacy or Health Dept.or vaccine clinic. Aware to provide a copy of the vaccination record if obtained from local pharmacy or Health Dept. Verbalized acceptance and understanding.  Qualifies for Shingles Vaccine? Yes   Zostavax completed No   Shingrix Completed?: No.    Education has been provided regarding the importance of this vaccine. Patient has been advised to call insurance company to determine out of pocket expense if they have not yet received this vaccine. Advised may also receive vaccine at local pharmacy or Health Dept. Verbalized acceptance and understanding.  Screening Tests Health Maintenance  Topic Date Due   Zoster Vaccines- Shingrix (1 of 2) Never done   FOOT EXAM  05/30/2016   TETANUS/TDAP  04/13/2019   COVID-19 Vaccine (3 - Moderna risk series) 01/12/2020   OPHTHALMOLOGY EXAM  10/15/2020   INFLUENZA VACCINE  03/20/2021   HEMOGLOBIN A1C  08/08/2021   Fecal DNA (Cologuard)  08/29/2021   Pneumonia Vaccine 69+ Years old  Completed   Hepatitis C Screening  Completed   HPV VACCINES  Aged Out  Health Maintenance  Health Maintenance Due  Topic Date Due   Zoster Vaccines- Shingrix (1 of 2) Never done   FOOT EXAM  05/30/2016   TETANUS/TDAP  04/13/2019   COVID-19 Vaccine (3 - Moderna risk series) 01/12/2020   OPHTHALMOLOGY EXAM  10/15/2020   INFLUENZA VACCINE  03/20/2021    Colorectal cancer screening: Type of screening: Colonoscopy. Completed 08/29/18. Repeat every 3 years  Lung Cancer Screening: (Low Dose CT Chest recommended if Age 98-80 years, 30 pack-year currently  smoking OR have quit w/in 15years.) does not qualify.     Additional Screening:  Hepatitis C Screening: does qualify; Completed 06/04/16  Vision Screening: Recommended annual ophthalmology exams for early detection of glaucoma and other disorders of the eye. Is the patient up to date with their annual eye exam?  No , patient plans to make an appointment. Who is the provider or what is the name of the office in which the patient attends annual eye exams? [facility] Screening: Recommended annual dental exams for proper oral hygiene  Community Resource Referral / Chronic Care Management: CRR required this visit?  No   CCM required this visit?  No      Plan:     I have personally reviewed and noted the following in the patient's chart:   Medical and social history Use of alcohol, tobacco or illicit drugs  Current medications and supplements including opioid prescriptions. Patient is not currently taking opioid prescriptions. Functional ability and status Nutritional status Physical activity Advanced directives List of other physicians Hospitalizations, surgeries, and ER visits in previous 12 months Vitals Screenings to include cognitive, depression, and falls Referrals and appointments  In addition, I have reviewed and discussed with patient certain preventive protocols, quality metrics, and best practice recommendations. A written personalized care plan for preventive services as well as general preventive health recommendations were provided to patient.   Due to this being a telephonic visit, the after visit summary with patients personalized plan was offered to patient via mail or my-chart. Patient would like to access on my-chart.    Loma Messing, LPN   [phone]   Nurse Health Advisor  Nurse Notes: none

## 2021-08-02 ENCOUNTER — Other Ambulatory Visit: Payer: Medicare HMO

## 2021-08-02 ENCOUNTER — Telehealth: Payer: Self-pay | Admitting: *Deleted

## 2021-08-02 ENCOUNTER — Ambulatory Visit (INDEPENDENT_AMBULATORY_CARE_PROVIDER_SITE_OTHER): Payer: Medicare HMO

## 2021-08-02 VITALS — Wt 174.0 lb

## 2021-08-02 DIAGNOSIS — Z0000 Encounter for general adult medical examination without abnormal findings: Secondary | ICD-10-CM | POA: Diagnosis not present

## 2021-08-02 NOTE — Telephone Encounter (Signed)
We normally will send through a staff message.  Thanks! Will get Terri to help.

## 2021-08-02 NOTE — Patient Instructions (Addendum)
[name] , Thank you for taking time to complete your Medicare Wellness Visit. I appreciate your ongoing commitment to your health goals. Please review the following plan we discussed and let me know if I can assist you in the future.   Screening recommendations/referrals: Colonoscopy: cologuard up to date, completed 08/29/18, due 08/29/21 Recommended yearly ophthalmology/optometry visit for glaucoma screening and checkup Recommended yearly dental visit for hygiene and checkup  Vaccinations: Influenza vaccine: Due-May obtain vaccine at our office or your local pharmacy. Pneumococcal vaccine: up to date   Tdap vaccine: due, last completed 04/12/09, discuss with your local pharmacy Shingles vaccine: Discuss with local pharmacy  Covid-19:  newest booster available at your local pharmacy  Advanced directives: Please bring a copy of Living Will and/or [place] for your chart.   Conditions/risks identified: see problem list  Next appointment: Follow up in one year for your annual wellness visit. 08/08/22 @ 10:30am, this will be a telephone visit.   Preventive Care 65 Years and Older, Male Preventive care refers to lifestyle choices and visits with your health care provider that can promote health and wellness. What does preventive care include? A yearly physical exam. This is also called an annual well check. Dental exams once or twice a year. Routine eye exams. Ask your health care provider how often you should have your eyes checked. Personal lifestyle choices, including: Daily care of your teeth and gums. Regular physical activity. Eating a healthy diet. Avoiding tobacco and drug use. Limiting alcohol use. Practicing safe sex. Taking low doses of aspirin every day. Taking vitamin and mineral supplements as recommended by your health care provider. What happens during an annual well check? The services and screenings done by your health care provider during your  annual well check will depend on your age, overall health, lifestyle risk factors, and family history of disease. Counseling  Your health care provider may ask you questions about your: Alcohol use. Tobacco use. Drug use. Emotional well-being. Home and relationship well-being. Sexual activity. Eating habits. History of falls. Memory and ability to understand (cognition). Work and work Statistician. Screening  You may have the following tests or measurements: Height, weight, and BMI. Blood pressure. Lipid and cholesterol levels. These may be checked every 5 years, or more frequently if you are over 79 years old. Skin check. Lung cancer screening. You may have this screening every year starting at age 36 if you have a 30-pack-year history of smoking and currently smoke or have quit within the past 15 years. Fecal occult blood test (FOBT) of the stool. You may have this test every year starting at age 56. Flexible sigmoidoscopy or colonoscopy. You may have a sigmoidoscopy every 5 years or a colonoscopy every 10 years starting at age 34. Prostate cancer screening. Recommendations will vary depending on your family history and other risks. Hepatitis C blood test. Hepatitis B blood test. Sexually transmitted disease (STD) testing. Diabetes screening. This is done by checking your blood sugar (glucose) after you have not eaten for a while (fasting). You may have this done every 1-3 years. Abdominal aortic aneurysm (AAA) screening. You may need this if you are a current or former smoker. Osteoporosis. You may be screened starting at age 18 if you are at high risk. Talk with your health care provider about your test results, treatment options, and if necessary, the need for more tests. Vaccines  Your health care provider may recommend certain vaccines, such as: Influenza vaccine. This is recommended every  year. Tetanus, diphtheria, and acellular pertussis (Tdap, Td) vaccine. You may need a Td  booster every 10 years. Zoster vaccine. You may need this after age 25. Pneumococcal 13-valent conjugate (PCV13) vaccine. One dose is recommended after age 102. Pneumococcal polysaccharide (PPSV23) vaccine. One dose is recommended after age 67. Talk to your health care provider about which screenings and vaccines you need and how often you need them. This information is not intended to replace advice given to you by your health care provider. Make sure you discuss any questions you have with your health care provider. Document Released: 09/02/2015 Document Revised: 04/25/2016 Document Reviewed: 06/07/2015 Elsevier Interactive Patient Education  2017 [place] Prevention in the Home Falls can cause injuries. They can happen to people of all ages. There are many things you can do to make your home safe and to help prevent falls. What can I do on the outside of my home? Regularly fix the edges of walkways and driveways and fix any cracks. Remove anything that might make you trip as you walk through a door, such as a raised step or threshold. Trim any bushes or trees on the path to your home. Use bright outdoor lighting. Clear any walking paths of anything that might make someone trip, such as rocks or tools. Regularly check to see if handrails are loose or broken. Make sure that both sides of any steps have handrails. Any raised decks and porches should have guardrails on the edges. Have any leaves, snow, or ice cleared regularly. Use sand or salt on walking paths during winter. Clean up any spills in your garage right away. This includes oil or grease spills. What can I do in the bathroom? Use night lights. Install grab bars by the toilet and in the tub and shower. Do not use towel bars as grab bars. Use non-skid mats or decals in the tub or shower. If you need to sit down in the shower, use a plastic, non-slip stool. Keep the floor dry. Clean up any water that spills on the floor  as soon as it happens. Remove soap buildup in the tub or shower regularly. Attach bath mats securely with double-sided non-slip rug tape. Do not have throw rugs and other things on the floor that can make you trip. What can I do in the bedroom? Use night lights. Make sure that you have a light by your bed that is easy to reach. Do not use any sheets or blankets that are too big for your bed. They should not hang down onto the floor. Have a firm chair that has side arms. You can use this for support while you get dressed. Do not have throw rugs and other things on the floor that can make you trip. What can I do in the kitchen? Clean up any spills right away. Avoid walking on wet floors. Keep items that you use a lot in easy-to-reach places. If you need to reach something above you, use a strong step stool that has a grab bar. Keep electrical cords out of the way. Do not use floor polish or wax that makes floors slippery. If you must use wax, use non-skid floor wax. Do not have throw rugs and other things on the floor that can make you trip. What can I do with my stairs? Do not leave any items on the stairs. Make sure that there are handrails on both sides of the stairs and use them. Fix handrails that are broken or  loose. Make sure that handrails are as long as the stairways. Check any carpeting to make sure that it is firmly attached to the stairs. Fix any carpet that is loose or worn. Avoid having throw rugs at the top or bottom of the stairs. If you do have throw rugs, attach them to the floor with carpet tape. Make sure that you have a light switch at the top of the stairs and the bottom of the stairs. If you do not have them, ask someone to add them for you. What else can I do to help prevent falls? Wear shoes that: Do not have high heels. Have rubber bottoms. Are comfortable and fit you well. Are closed at the toe. Do not wear sandals. If you use a stepladder: Make sure that it is  fully opened. Do not climb a closed stepladder. Make sure that both sides of the stepladder are locked into place. Ask someone to hold it for you, if possible. Clearly mark and make sure that you can see: Any grab bars or handrails. First and last steps. Where the edge of each step is. Use tools that help you move around (mobility aids) if they are needed. These include: Canes. Walkers. Scooters. Crutches. Turn on the lights when you go into a dark area. Replace any light bulbs as soon as they burn out. Set up your furniture so you have a clear path. Avoid moving your furniture around. If any of your floors are uneven, fix them. If there are any pets around you, be aware of where they are. Review your medicines with your doctor. Some medicines can make you feel dizzy. This can increase your chance of falling. Ask your doctor what other things that you can do to help prevent falls. This information is not intended to replace advice given to you by your health care provider. Make sure you discuss any questions you have with your health care provider. Document Released: 06/02/2009 Document Revised: 01/12/2016 Document Reviewed: 09/10/2014 Elsevier Interactive Patient Education  2017 [employer].

## 2021-08-02 NOTE — Telephone Encounter (Signed)
Please place future orders for lab appt.  

## 2021-08-03 ENCOUNTER — Telehealth: Payer: Self-pay | Admitting: Family Medicine

## 2021-08-03 ENCOUNTER — Other Ambulatory Visit: Payer: Medicare HMO

## 2021-08-03 ENCOUNTER — Other Ambulatory Visit: Payer: Self-pay | Admitting: Family Medicine

## 2021-08-03 ENCOUNTER — Other Ambulatory Visit: Payer: Self-pay

## 2021-08-03 DIAGNOSIS — Z125 Encounter for screening for malignant neoplasm of prostate: Secondary | ICD-10-CM

## 2021-08-03 DIAGNOSIS — E1165 Type 2 diabetes mellitus with hyperglycemia: Secondary | ICD-10-CM

## 2021-08-03 DIAGNOSIS — E785 Hyperlipidemia, unspecified: Secondary | ICD-10-CM

## 2021-08-03 DIAGNOSIS — Z79899 Other long term (current) drug therapy: Secondary | ICD-10-CM

## 2021-08-03 NOTE — Telephone Encounter (Signed)
Please always send to me through a staff message.  I would not like to change how I have been doing them for 15 years.  Terri, can you please handle from now on.

## 2021-08-03 NOTE — Telephone Encounter (Signed)
Future orders need to be placed prior to patients arrival at 10am this morning or he needs to be rescheduled.  Thanks

## 2021-08-03 NOTE — Telephone Encounter (Signed)
Pt came in to [facility] to get labs done and there is none in the system

## 2021-08-03 NOTE — Telephone Encounter (Signed)
See my other notes.

## 2021-08-04 ENCOUNTER — Other Ambulatory Visit: Payer: Self-pay

## 2021-08-04 ENCOUNTER — Other Ambulatory Visit (INDEPENDENT_AMBULATORY_CARE_PROVIDER_SITE_OTHER): Payer: Medicare HMO

## 2021-08-04 DIAGNOSIS — Z79899 Other long term (current) drug therapy: Secondary | ICD-10-CM | POA: Diagnosis not present

## 2021-08-04 DIAGNOSIS — Z125 Encounter for screening for malignant neoplasm of prostate: Secondary | ICD-10-CM

## 2021-08-04 DIAGNOSIS — E1165 Type 2 diabetes mellitus with hyperglycemia: Secondary | ICD-10-CM | POA: Diagnosis not present

## 2021-08-04 DIAGNOSIS — E785 Hyperlipidemia, unspecified: Secondary | ICD-10-CM | POA: Diagnosis not present

## 2021-08-04 LAB — LIPID PANEL
Cholesterol: 112 mg/dL (ref 0–200)
HDL: 34.2 mg/dL — ABNORMAL LOW (ref >39.00)
LDL Cholesterol: 48 mg/dL (ref 0–99)
NonHDL: 77.86
Total CHOL/HDL Ratio: 3
Triglycerides: 148 mg/dL (ref 0.0–149.0)
VLDL: 29.6 mg/dL (ref 0.0–40.0)

## 2021-08-04 LAB — CBC WITH DIFFERENTIAL/PLATELET
Basophils Absolute: 0 10*3/uL (ref 0.0–0.1)
Basophils Relative: 0.7 % (ref 0.0–3.0)
Eosinophils Absolute: 0.2 10*3/uL (ref 0.0–0.7)
Eosinophils Relative: 3.1 % (ref 0.0–5.0)
HCT: 40.5 % (ref 39.0–52.0)
Hemoglobin: 13.8 g/dL (ref 13.0–17.0)
Lymphocytes Relative: 32.4 % (ref 12.0–46.0)
Lymphs Abs: 2.1 10*3/uL (ref 0.7–4.0)
MCHC: 34.2 g/dL (ref 30.0–36.0)
MCV: 92.5 fl (ref 78.0–100.0)
Monocytes Absolute: 0.7 10*3/uL (ref 0.1–1.0)
Monocytes Relative: 10.7 % (ref 3.0–12.0)
Neutro Abs: 3.4 10*3/uL (ref 1.4–7.7)
Neutrophils Relative %: 53.1 % (ref 43.0–77.0)
Platelets: 163 10*3/uL (ref 150.0–400.0)
RBC: 4.38 Mil/uL (ref 4.22–5.81)
RDW: 13.2 % (ref 11.5–15.5)
WBC: 6.5 10*3/uL (ref 4.0–10.5)

## 2021-08-04 LAB — BASIC METABOLIC PANEL
BUN: 19 mg/dL (ref 6–23)
CO2: 27 mEq/L (ref 19–32)
Calcium: 10.2 mg/dL (ref 8.4–10.5)
Chloride: 104 mEq/L (ref 96–112)
Creatinine, Ser: 1.29 mg/dL (ref 0.40–1.50)
GFR: 54.35 mL/min — ABNORMAL LOW (ref >60.00)
Glucose, Bld: 129 mg/dL — ABNORMAL HIGH (ref 70–99)
Potassium: 4.7 mEq/L (ref 3.5–5.1)
Sodium: 140 mEq/L (ref 135–145)

## 2021-08-04 LAB — HEPATIC FUNCTION PANEL
ALT: 29 U/L (ref 0–53)
AST: 18 U/L (ref 0–37)
Albumin: 4.4 g/dL (ref 3.5–5.2)
Alkaline Phosphatase: 38 U/L — ABNORMAL LOW (ref 39–117)
Bilirubin, Direct: 0.2 mg/dL (ref 0.0–0.3)
Total Bilirubin: 0.9 mg/dL (ref 0.2–1.2)
Total Protein: 6.9 g/dL (ref 6.0–8.3)

## 2021-08-04 LAB — PSA, MEDICARE: PSA: 2.63 ng/ml (ref 0.10–4.00)

## 2021-08-04 LAB — HEMOGLOBIN A1C: Hgb A1c MFr Bld: 7.3 % — ABNORMAL HIGH (ref 4.6–6.5)

## 2021-08-04 NOTE — Addendum Note (Signed)
Addended by: Neta Ehlers on: 08/04/2021 02:20 PM   Modules accepted: Orders

## 2021-08-07 ENCOUNTER — Ambulatory Visit (INDEPENDENT_AMBULATORY_CARE_PROVIDER_SITE_OTHER): Payer: Medicare HMO | Admitting: Family Medicine

## 2021-08-07 ENCOUNTER — Other Ambulatory Visit: Payer: Self-pay

## 2021-08-07 ENCOUNTER — Encounter: Payer: Self-pay | Admitting: Family Medicine

## 2021-08-07 VITALS — BP 150/64 | HR 54 | Temp 97.8°F | Ht 66.0 in | Wt 171.4 lb

## 2021-08-07 DIAGNOSIS — Z0000 Encounter for general adult medical examination without abnormal findings: Secondary | ICD-10-CM | POA: Diagnosis not present

## 2021-08-07 DIAGNOSIS — Z23 Encounter for immunization: Secondary | ICD-10-CM

## 2021-08-07 DIAGNOSIS — Z1211 Encounter for screening for malignant neoplasm of colon: Secondary | ICD-10-CM | POA: Diagnosis not present

## 2021-08-07 DIAGNOSIS — M65321 Trigger finger, right index finger: Secondary | ICD-10-CM

## 2021-08-07 NOTE — Patient Instructions (Addendum)
Bivalent Covid vaccine  Ask about Tdap vaccine at your pharmacy  Check with your insurance to see if they will cover the shingles shot.  The newer [facility] shot is much better than the older shot. The [facility] shot requires 2 shots given 6 months apart.  Almost always, this shot is not covered in our office by your insurance. It costs 500 dollars, but essentially all insurances cover it at your pharmacy.  I would call your insurance number on your card to confirm this.

## 2021-08-07 NOTE — Progress Notes (Signed)
Gregory T. Copland, MD, [place] at [facility] [place], [address]  Phone: [phone]   FAX: [phone]  [name] - 75 y.o. male   MRN [id number]   Date of Birth: 01-Dec-1945  Date: 08/07/2021   PCP: [name], MD   Referral: [name], MD  Chief Complaint  Patient presents with   Annual Exam    Part 2    This visit occurred during the SARS-CoV-2 public health emergency.  Safety protocols were in place, including screening questions prior to the visit, additional usage of staff PPE, and extensive cleaning of exam room while observing appropriate contact time as indicated for disinfecting solutions.   Patient Care Team: [name], MD as PCP - General Rockey Situ, Kathlene November, MD as PCP - Cardiology (Cardiology) Minna Merritts, MD as Consulting Physician (Cardiology) Subjective:   [name] is a 75 y.o. pleasant patient who presents for a general health care exam.  The patient is already had his medical wellness exam, so this is a complete wellness exam.  Preventative Health Maintenance Visit:  Health Maintenance Summary Reviewed and updated, unless pt declines services.  Tobacco History Reviewed. Alcohol: About 2 glasses of wine a night, sometimes less.  He did drink quite a bit of liquor in the past. Exercise Habits: He is getting well above 10,000 steps a day right now STD concerns: no risk or activity to increase risk Drug Use: None  150-227 at a max, blood sugar  Bp 120-130 at home  Eating some fries No pasta, some bread sometimes.  About 2 drinks of wine each night  Shingrix Tdap? Covid bivalent booster, has had the primary series. Repeat cologuard  Diabetes Mellitus: Tolerating Medications: yes Compliance with diet: fair, Body mass index is 27.66 kg/m. Exercise: minimal / intermittent Avg blood sugars at home: not checking Foot problems: none Hypoglycemia:  none No nausea, vomitting, blurred vision, polyuria.  Lab Results  Component Value Date   HGBA1C 7.3 (H) 08/04/2021   HGBA1C 6.7 (A) 02/06/2021   HGBA1C 6.6 (H) 08/01/2020   Lab Results  Component Value Date   MICROALBUR 1.4 08/01/2020   LDLCALC 48 08/04/2021   CREATININE 1.29 08/04/2021    Wt Readings from Last 3 Encounters:  08/07/21 171 lb 6 oz (77.7 kg)  08/02/21 174 lb (78.9 kg)  04/04/21 174 lb 6 oz (79.1 kg)     Health Maintenance  Topic Date Due   Zoster Vaccines- Shingrix (1 of 2) Never done   FOOT EXAM  05/30/2016   TETANUS/TDAP  04/13/2019   COVID-19 Vaccine (3 - Moderna risk series) 01/12/2020   OPHTHALMOLOGY EXAM  10/15/2020   Fecal DNA (Cologuard)  08/29/2021   HEMOGLOBIN A1C  02/02/2022   Pneumonia Vaccine 3+ Years old  Completed   INFLUENZA VACCINE  Completed   Hepatitis C Screening  Completed   HPV VACCINES  Aged Out    Immunization History  Administered Date(s) Administered   Fluad Quad(high Dose 65+) 05/27/2020, 08/07/2021   Influenza Split 08/02/2011, 06/11/2012   Influenza Whole 08/10/2010   Influenza, High Dose Seasonal PF 05/18/2014   Influenza,inj,Quad PF,6+ Mos 05/25/2013, 05/30/2015, 06/13/2017   Influenza-Unspecified 05/16/2016   Moderna Sars-Covid-2 Vaccination 11/17/2019, 12/15/2019   Pneumococcal Conjugate-13 05/30/2015   Pneumococcal Polysaccharide-23 10/11/2011   Td 08/20/1993, 04/12/2009    Patient Active Problem List   Diagnosis Date Noted   CAD (coronary artery disease), autologous vein bypass graft  10/11/2011    Priority: High   Diabetes type 2, uncontrolled 04/13/2009    Priority: High   Essential hypertension     Priority: Medium    Hyperlipidemia LDL goal <70 05/13/2008    Priority: Medium    Seborrheic keratosis 05/27/2020   Alcohol abuse 08/10/2010   History of colonic polyps 03/12/2007   DEPRESSION 12/03/2006    Past Medical History:  Diagnosis Date   Alcohol abuse, unspecified    CAD (coronary artery  disease) 9/06   CABG x4   Colon polyps    Depression    DMII (diabetes mellitus, type 2) ([place]) 10/03   HLD (hyperlipidemia) 10/03   HTN (hypertension) 10/03    Past Surgical History:  Procedure Laterality Date   COLONOSCOPY     CORONARY ARTERY BYPASS GRAFT  9/06   x4   ESOPHAGOGASTRODUODENOSCOPY  7/07   polyp; mucosal abnml   TONSILLECTOMY      Family History  Problem Relation Age of Onset   Alcohol abuse Father    Stroke Father    Other Father        cardiac problems   Heart disease Father    COPD Mother    Alcohol abuse Brother    Cirrhosis Brother        EtOH   Anxiety disorder Brother    Heart disease Paternal Grandmother    Colon cancer Neg Hx    Esophageal cancer Neg Hx    Rectal cancer Neg Hx    Stomach cancer Neg Hx     Past Medical History, Surgical History, Social History, Family History, Problem List, Medications, and Allergies have been reviewed and updated if relevant.  Review of Systems: Pertinent positives are listed above.  Otherwise, a full 14 point review of systems has been done in full and it is negative except where it is noted positive.  Objective:   BP (!) 150/64    Pulse (!) 54    Temp 97.8 F (36.6 C) (Temporal)    Ht 5' 6" (1.676 m)    Wt 171 lb 6 oz (77.7 kg)    SpO2 97%    BMI 27.66 kg/m  Fall Risk 06/13/2017 07/21/2018 07/27/2019 08/04/2020 08/02/2021  Falls in the past year? No 0 0 0 0  Was there an injury with Fall? - - 0 - 0  Fall Risk Category Calculator - - 0 - 0  Fall Risk Category - - Low - Low  Patient Fall Risk Level - - Low fall risk Low fall risk Low fall risk  Patient at Risk for Falls Due to - - Medication side effect No Fall Risks No Fall Risks  Fall risk Follow up - - Falls evaluation completed;Falls prevention discussed - Falls prevention discussed   Ideal Body Weight: Weight in (lb) to have BMI = 25: 154.6 No results found. Depression screen [facility] 2/9 08/02/2021 07/27/2019 07/21/2018 06/13/2017 06/06/2016  Decreased  Interest 0 1 0 0 0  Down, Depressed, Hopeless 0 1 0 0 -  PHQ - 2 Score 0 2 0 0 0  Altered sleeping - 0 0 - -  Tired, decreased energy - 0 0 - -  Change in appetite - 0 0 - -  Feeling bad or failure about yourself  - 0 0 - -  Trouble concentrating - 0 0 - -  Moving slowly or fidgety/restless - 0 0 - -  Suicidal thoughts - 0 0 - -  PHQ-9 Score - 2 0 - -  Difficult doing work/chores - Not difficult at all Not difficult at all - -     GEN: well developed, well nourished, no acute distress Eyes: conjunctiva and lids normal, PERRLA, EOMI ENT: TM clear, nares clear, oral exam WNL Neck: supple, no lymphadenopathy, no thyromegaly, no JVD Pulm: clear to auscultation and percussion, respiratory effort normal CV: regular rate and rhythm, S1-S2, no murmur, rub or gallop, no bruits, peripheral pulses normal and symmetric, no cyanosis, clubbing, edema or varicosities GI: soft, non-tender; no hepatosplenomegaly, masses; active bowel sounds all quadrants GU: deferred Lymph: no cervical, axillary or inguinal adenopathy MSK: gait normal, muscle tone and strength WNL, no joint swelling, effusions, discoloration, crepitus.  He has having some triggering on the second digit on the right. SKIN: clear, good turgor, color WNL, no rashes, lesions, or ulcerations Neuro: normal mental status, normal strength, sensation, and motion Psych: alert; oriented to person, place and time, normally interactive and not anxious or depressed in appearance.  All labs reviewed with patient.  Results for orders placed or performed in visit on 08/04/21  PSA, Medicare  Result Value Ref Range   PSA 2.63 0.10 - 4.00 ng/ml  Hemoglobin A1c  Result Value Ref Range   Hgb A1c MFr Bld 7.3 (H) 4.6 - 6.5 %  CBC with Differential/Platelet  Result Value Ref Range   WBC 6.5 4.0 - 10.5 K/uL   RBC 4.38 4.22 - 5.81 Mil/uL   Hemoglobin 13.8 13.0 - 17.0 g/dL   HCT 40.5 39.0 - 52.0 %   MCV 92.5 78.0 - 100.0 fl   MCHC 34.2 30.0 - 36.0  g/dL   RDW 13.2 11.5 - 15.5 %   Platelets 163.0 150.0 - 400.0 K/uL   Neutrophils Relative % 53.1 43.0 - 77.0 %   Lymphocytes Relative 32.4 12.0 - 46.0 %   Monocytes Relative 10.7 3.0 - 12.0 %   Eosinophils Relative 3.1 0.0 - 5.0 %   Basophils Relative 0.7 0.0 - 3.0 %   Neutro Abs 3.4 1.4 - 7.7 K/uL   Lymphs Abs 2.1 0.7 - 4.0 K/uL   Monocytes Absolute 0.7 0.1 - 1.0 K/uL   Eosinophils Absolute 0.2 0.0 - 0.7 K/uL   Basophils Absolute 0.0 0.0 - 0.1 K/uL  Basic metabolic panel  Result Value Ref Range   Sodium 140 135 - 145 mEq/L   Potassium 4.7 3.5 - 5.1 mEq/L   Chloride 104 96 - 112 mEq/L   CO2 27 19 - 32 mEq/L   Glucose, Bld 129 (H) 70 - 99 mg/dL   BUN 19 6 - 23 mg/dL   Creatinine, Ser 1.29 0.40 - 1.50 mg/dL   GFR 54.35 (L) >60.00 mL/min   Calcium 10.2 8.4 - 10.5 mg/dL  Hepatic function panel  Result Value Ref Range   Total Bilirubin 0.9 0.2 - 1.2 mg/dL   Bilirubin, Direct 0.2 0.0 - 0.3 mg/dL   Alkaline Phosphatase 38 (L) 39 - 117 U/L   AST 18 0 - 37 U/L   ALT 29 0 - 53 U/L   Total Protein 6.9 6.0 - 8.3 g/dL   Albumin 4.4 3.5 - 5.2 g/dL  Lipid panel  Result Value Ref Range   Cholesterol 112 0 - 200 mg/dL   Triglycerides 148.0 0.0 - 149.0 mg/dL   HDL 34.20 (L) >39.00 mg/dL   VLDL 29.6 0.0 - 40.0 mg/dL   LDL Cholesterol 48 0 - 99 mg/dL   Total CHOL/HDL Ratio 3    NonHDL 77.86  Assessment and Plan:     ICD-10-CM   1. Healthcare maintenance  Z00.00     2. Screen for colon cancer  Z12.11 Cologuard    3. Trigger finger, right index finger  M65.321     4. Need for influenza vaccination  Z23 Flu Vaccine QUAD High Dose(Fluad)     We will update his vaccines today, and I gave him some information about additional vaccines including tetanus, Shingrix, and the BiValent COVID-vaccine.  It is time for him to be screened again for colon cancer, and I ordered a Cologuard for him.  Otherwise he is doing basically pretty well his diabetes is stable with A1c at 7.3.  Blood  pressures are stable at home.  He is very compliant with all of his medications.  He has cut back on his alcohol a lot.  Trigger finger is minimally symptomatic at this point.  If it worsens it is very easy for me to inject his tendon sheath.  Recheck 6 months, routine diabetic follow-up  Patient Instructions  Bivalent Covid vaccine  Ask about Tdap vaccine at your pharmacy  Check with your insurance to see if they will cover the shingles shot.  The newer [facility] shot is much better than the older shot. The [facility] shot requires 2 shots given 6 months apart.  Almost always, this shot is not covered in our office by your insurance. It costs 500 dollars, but essentially all insurances cover it at your pharmacy.  I would call your insurance number on your card to confirm this.    Health Maintenance Exam: The patient's preventative maintenance and recommended screening tests for an annual wellness exam were reviewed in full today. Brought up to date unless services declined.  Counselled on the importance of diet, exercise, and its role in overall health and mortality. The patient's FH and SH was reviewed, including their home life, tobacco status, and drug and alcohol status.  Follow-up in 1 year for physical exam or additional follow-up below.  Follow-up: Return in about 6 months (around 02/05/2022) for Dr. Lorelei Pont for diabetes check. Or follow-up in 1 year if not noted.  Future Appointments  Date Time Provider [place]  08/16/2021 11:20 AM [name], MD [facility]  02/05/2022 10:20 AM Key, Frederico Hamman, MD [facility]  08/08/2022 10:30 AM [facility] NURSE HEALTH ADVISOR [facility]    No orders of the defined types were placed in this encounter.  There are no discontinued medications. Orders Placed This Encounter  Procedures   Flu Vaccine QUAD High Dose(Fluad)   Cologuard    Signed,  Gregory T. Copland, MD   Allergies as of 08/07/2021       Reactions    Sulfonamide Derivatives    REACTION: unknown reaction        Medication List        Accurate as of August 07, 2021  4:15 PM. If you have any questions, ask your nurse or doctor.          Accu-Chek Aviva Plus w/Device Kit Check blood sugar once daily and as instructed. Dx E11.65   Accu-Chek Softclix Lancets lancets Check blood sugar once daily and as instructed. Dx E11.65   amLODipine 10 MG tablet Commonly known as: NORVASC TAKE 1 TABLET BY MOUTH EVERY DAY   aspirin 81 MG tablet Take 1 tablet (81 mg total) by mouth daily.   atorvastatin 80 MG tablet Commonly known as: LIPITOR TAKE 1 TABLET(80 MG) BY MOUTH DAILY   calcium carbonate  500 MG chewable tablet Commonly known as: TUMS - dosed in mg elemental calcium Chew 1 tablet by mouth daily as needed for indigestion or heartburn.   doxazosin 1 MG tablet Commonly known as: CARDURA TAKE 1 TABLET(1 MG) BY MOUTH DAILY   fish oil-omega-3 fatty acids 1000 MG capsule Take 2 g by mouth daily.   glucose blood test strip Commonly known as: Accu-Chek Aviva Plus Check blood sugar once daily and as instructed. Dx E11.65   losartan 100 MG tablet Commonly known as: COZAAR TAKE 1 TABLET(100 MG) BY MOUTH DAILY   metFORMIN 500 MG 24 hr tablet Commonly known as: GLUCOPHAGE-XR TAKE 4 TABLETS BY MOUTH EVERY DAY WITH BREAKFAST   metoprolol tartrate 50 MG tablet Commonly known as: LOPRESSOR TAKE 1 TABLET(50 MG) BY MOUTH TWICE DAILY   multivitamin tablet Take 1 tablet by mouth daily.

## 2021-08-16 ENCOUNTER — Ambulatory Visit: Payer: Medicare HMO | Admitting: Family Medicine

## 2021-08-31 ENCOUNTER — Other Ambulatory Visit: Payer: Self-pay | Admitting: Family

## 2021-09-18 DIAGNOSIS — Z1211 Encounter for screening for malignant neoplasm of colon: Secondary | ICD-10-CM | POA: Diagnosis not present

## 2021-09-23 ENCOUNTER — Other Ambulatory Visit: Payer: Self-pay | Admitting: Family Medicine

## 2021-09-23 LAB — COLOGUARD: COLOGUARD: NEGATIVE

## 2021-10-07 ENCOUNTER — Other Ambulatory Visit: Payer: Self-pay | Admitting: Family Medicine

## 2021-10-24 ENCOUNTER — Other Ambulatory Visit: Payer: Self-pay | Admitting: Cardiovascular Disease

## 2022-01-11 ENCOUNTER — Other Ambulatory Visit: Payer: Self-pay | Admitting: Family Medicine

## 2022-01-13 ENCOUNTER — Other Ambulatory Visit: Payer: Self-pay | Admitting: Family Medicine

## 2022-02-05 ENCOUNTER — Encounter: Payer: Self-pay | Admitting: Family Medicine

## 2022-02-05 ENCOUNTER — Ambulatory Visit (INDEPENDENT_AMBULATORY_CARE_PROVIDER_SITE_OTHER): Payer: Medicare HMO | Admitting: Family Medicine

## 2022-02-05 VITALS — BP 120/60 | HR 58 | Temp 97.9°F | Ht 66.0 in | Wt 174.4 lb

## 2022-02-05 DIAGNOSIS — E1165 Type 2 diabetes mellitus with hyperglycemia: Secondary | ICD-10-CM

## 2022-02-05 DIAGNOSIS — H532 Diplopia: Secondary | ICD-10-CM | POA: Diagnosis not present

## 2022-02-05 LAB — POCT GLYCOSYLATED HEMOGLOBIN (HGB A1C): Hemoglobin A1C: 7.7 % — AB (ref 4.0–5.6)

## 2022-02-05 NOTE — Progress Notes (Signed)
L 

## 2022-02-05 NOTE — Progress Notes (Signed)
Gregory Guillet T. [name], MD, [place] at [facility] [place], [address]  Phone: [phone]  FAX: [phone]  [name] - 76 y.o. male  MRN [id number]  Date of Birth: [birth year]  Date: 02/05/2022  PCP: [name], MD  Referral: [name], MD  Chief Complaint  Patient presents with   Diabetes   Subjective:   [name] is a 76 y.o. very pleasant male patient with Body mass index is 28.14 kg/m. who presents with the following:  Diabetes Mellitus: Tolerating Medications: yes Compliance with diet: fair, Body mass index is 28.14 kg/m. Exercise: minimal / intermittent Avg blood sugars at home: not checking Foot problems: none Hypoglycemia: none No nausea, vomitting, blurred vision, polyuria.  - currently maxed on Metformin 2,000 mg  Lab Results  Component Value Date   HGBA1C 7.7 (A) 02/05/2022   HGBA1C 7.3 (H) 08/04/2021   HGBA1C 6.7 (A) 02/06/2021   Lab Results  Component Value Date   MICROALBUR 1.4 08/01/2020   LDLCALC 48 08/04/2021   CREATININE 1.29 08/04/2021    Wt Readings from Last 3 Encounters:  02/05/22 174 lb 6 oz (79.1 kg)  08/07/21 171 lb 6 oz (77.7 kg)  08/02/21 174 lb (78.9 kg)    HTN: Tolerating all medications without side effects Stable and at goal No CP, no sob. No HA.  BP Readings from Last 3 Encounters:  02/05/22 120/60  08/07/21 (!) 150/64  04/04/21 458/59    Basic Metabolic Panel:    Component Value Date/Time   NA 140 08/04/2021 1406   K 4.7 08/04/2021 1406   CL 104 08/04/2021 1406   CO2 27 08/04/2021 1406   BUN 19 08/04/2021 1406   CREATININE 1.29 08/04/2021 1406   GLUCOSE 129 (H) 08/04/2021 1406   CALCIUM 10.2 08/04/2021 1406    Eating a fair amount of carbs.   - drinking 16 oz of wine a day  Still having trigger fingers, R   Having some vision changes - went to [facility]. Now having some blurriness or changes.  Will have some double  vision.  Review of Systems is noted in the HPI, as appropriate    Objective:   BP 120/60   Pulse (!) 58   Temp 97.9 F (36.6 C) (Oral)   Ht 5' 6"  (1.676 m)   Wt 174 lb 6 oz (79.1 kg)   SpO2 96%   BMI 28.14 kg/m   GEN: No acute distress; alert,appropriate. PULM: Breathing comfortably in no respiratory distress PSYCH: Normally interactive.  CV: RRR, no m/g/r   Laboratory and Imaging Data:  Results for orders placed or performed in visit on 02/05/22  POCT glycosylated hemoglobin (Hb A1C)  Result Value Ref Range   Hemoglobin A1C 7.7 (A) 4.0 - 5.6 %   HbA1c POC (<> result, manual entry)     HbA1c, POC (prediabetic range)     HbA1c, POC (controlled diabetic range)       Assessment and Plan:     ICD-10-CM   1. Uncontrolled type 2 diabetes mellitus with hyperglycemia (HCC)  E11.65 POCT glycosylated hemoglobin (Hb A1C)    2. Double vision  H53.2 Ambulatory referral to Ophthalmology     Unstable type 2 diabetes.  Right now, his diet is not all that great, and he has been eating [place] fries roughly 3-4 times a week.  He also drinks roughly 32 ounces of wine a night.  I think at this point  I would primarily cut back on his carb intake, [place] fry intake, and wine consumption, and I suspect that this will be enough to normalize his diabetes.  If not, then we will have to add an additional agent.  He also describes some double vision and difficulty converging with his eyes.  This is not an acute, but it is a relatively subacute thing.  I am not exactly sure what is going on, but I do think he needs to see his eye doctor.  Medication Management during today's office visit: No orders of the defined types were placed in this encounter.  There are no discontinued medications.  Orders placed today for conditions managed today: Orders Placed This Encounter  Procedures   Ambulatory referral to Ophthalmology   POCT glycosylated hemoglobin (Hb A1C)    Follow-up if needed: No  follow-ups on file.  Dragon [employer] One speech-to-text software was used for transcription in this dictation.  Possible transcriptional errors can occur using Editor, commissioning.   Signed,  Maud Deed. Trusten Hume, MD   Outpatient Encounter Medications as of 02/05/2022  Medication Sig   ACCU-CHEK SOFTCLIX LANCETS lancets Check blood sugar once daily and as instructed. Dx E11.65   amLODipine (NORVASC) 10 MG tablet TAKE 1 TABLET BY MOUTH EVERY DAY   aspirin 81 MG tablet Take 1 tablet (81 mg total) by mouth daily.   atorvastatin (LIPITOR) 80 MG tablet TAKE 1 TABLET(80 MG) BY MOUTH DAILY   Blood Glucose Monitoring Suppl (ACCU-CHEK AVIVA PLUS) w/Device KIT Check blood sugar once daily and as instructed. Dx E11.65   calcium carbonate (TUMS - DOSED IN MG ELEMENTAL CALCIUM) 500 MG chewable tablet Chew 1 tablet by mouth daily as needed for indigestion or heartburn.   doxazosin (CARDURA) 1 MG tablet TAKE 1 TABLET(1 MG) BY MOUTH DAILY   fish oil-omega-3 fatty acids 1000 MG capsule Take 2 g by mouth daily.   glucose blood (ACCU-CHEK AVIVA PLUS) test strip Check blood sugar once daily and as instructed. Dx E11.65   losartan (COZAAR) 100 MG tablet TAKE 1 TABLET(100 MG) BY MOUTH DAILY   metFORMIN (GLUCOPHAGE-XR) 500 MG 24 hr tablet TAKE 4 TABLETS BY MOUTH EVERY DAY WITH BREAKFAST   metoprolol tartrate (LOPRESSOR) 50 MG tablet TAKE 1 TABLET(50 MG) BY MOUTH TWICE DAILY   Multiple Vitamin (MULTIVITAMIN) tablet Take 1 tablet by mouth daily.   No facility-administered encounter medications on file as of 02/05/2022.

## 2022-02-05 NOTE — Patient Instructions (Addendum)
[name] and wine - for your diabetes

## 2022-02-07 DIAGNOSIS — Z0100 Encounter for examination of eyes and vision without abnormal findings: Secondary | ICD-10-CM | POA: Diagnosis not present

## 2022-02-07 DIAGNOSIS — E119 Type 2 diabetes mellitus without complications: Secondary | ICD-10-CM | POA: Diagnosis not present

## 2022-02-07 DIAGNOSIS — H353112 Nonexudative age-related macular degeneration, right eye, intermediate dry stage: Secondary | ICD-10-CM | POA: Diagnosis not present

## 2022-02-07 LAB — HM DIABETES EYE EXAM

## 2022-02-20 NOTE — Progress Notes (Unsigned)
Cardiology Office Note  Date:  02/21/2022   ID:  TYAN DY, DOB [birth year], MRN [id number]  PCP:  [name], MD   Chief Complaint  Patient presents with   12 month follow up     "Doing well." Medications reviewed by the patient verbally.    HPI:  Mr. Ramakrishnan is a very pleasant 76 yo gentleman with remote history of  coronary artery disease, bypass surgery in 2006 x5 vessel  hyperlipidemia,  diabetes, hypertension,  alcohol daily, 2-4 a day who presents for routine followup of his coronary artery disease  LOV 03/2021 Feels well, Dragging in the afternoon BP well controlled Heart rate sometimes low 48 to 50, 55 today on metoprolol tartrate 50 BID  Labs reviewed A1C 7.7, up from 6.7, "french fries" Total chol 112, LDL 48  2 glasses of wine per night Bought motorcycle  Active, 10K steps a day  Walks around his property No chest pain concerning for angina  EKG personally reviewed by myself on todays visit  showing sinus bradycardia, rate 55 bpm, no significant ST or T-wave changes   Other past medical history  labs dated 05/18/2014 showing total cholesterol 126, LDL 62, hemoglobin A1c 7.0 He reports that he is alternating Lipitor 80 mg with 40 mg    He has not had a cardiac catheterization since his bypass surgery   PMH:   has a past medical history of Alcohol abuse, unspecified, CAD (coronary artery disease) (9/06), Colon polyps, Depression, DMII (diabetes mellitus, type 2) ([place]) (10/03), HLD (hyperlipidemia) (10/03), and HTN (hypertension) (10/03).  PSH:    Past Surgical History:  Procedure Laterality Date   COLONOSCOPY     CORONARY ARTERY BYPASS GRAFT  9/06   x4   ESOPHAGOGASTRODUODENOSCOPY  7/07   polyp; mucosal abnml   TONSILLECTOMY      Current Outpatient Medications  Medication Sig Dispense Refill   ACCU-CHEK SOFTCLIX LANCETS lancets Check blood sugar once daily and as instructed. Dx E11.65 100 each 3   amLODipine (NORVASC) 10 MG tablet TAKE 1  TABLET BY MOUTH EVERY DAY 90 tablet 2   aspirin 81 MG tablet Take 1 tablet (81 mg total) by mouth daily.     atorvastatin (LIPITOR) 80 MG tablet TAKE 1 TABLET(80 MG) BY MOUTH DAILY 90 tablet 3   Blood Glucose Monitoring Suppl (ACCU-CHEK AVIVA PLUS) w/Device KIT Check blood sugar once daily and as instructed. Dx E11.65 1 kit 0   calcium carbonate (TUMS - DOSED IN MG ELEMENTAL CALCIUM) 500 MG chewable tablet Chew 1 tablet by mouth daily as needed for indigestion or heartburn.     doxazosin (CARDURA) 1 MG tablet TAKE 1 TABLET(1 MG) BY MOUTH DAILY 30 tablet 11   fish oil-omega-3 fatty acids 1000 MG capsule Take 2 g by mouth daily.     glucose blood (ACCU-CHEK AVIVA PLUS) test strip Check blood sugar once daily and as instructed. Dx E11.65 100 each 3   losartan (COZAAR) 100 MG tablet TAKE 1 TABLET(100 MG) BY MOUTH DAILY 90 tablet 1   metFORMIN (GLUCOPHAGE-XR) 500 MG 24 hr tablet TAKE 4 TABLETS BY MOUTH EVERY DAY WITH BREAKFAST 360 tablet 0   metoprolol succinate (TOPROL-XL) 50 MG 24 hr tablet Take 1 tablet (50 mg total) by mouth daily. Take with or immediately following a meal. 90 tablet 3   Multiple Vitamin (MULTIVITAMIN) tablet Take 1 tablet by mouth daily.     No current facility-administered medications for this visit.    Allergies:   Sulfonamide  derivatives   Social History:  The patient  reports that he has quit smoking. He has never used smokeless tobacco. He reports current alcohol use of about 17.0 standard drinks of alcohol per week. He reports that he does not use drugs.   Family History:   family history includes Alcohol abuse in his brother and father; Anxiety disorder in his brother; COPD in his mother; Cirrhosis in his brother; Heart disease in his father and paternal grandmother; Other in his father; Stroke in his father.    Review of Systems: Review of Systems  Constitutional: Negative.   Respiratory: Negative.    Cardiovascular: Negative.   Gastrointestinal: Negative.    Musculoskeletal: Negative.   Neurological: Negative.   Psychiatric/Behavioral: Negative.    All other systems reviewed and are negative.   PHYSICAL EXAM: VS:  BP 140/80 (BP Location: Left Arm, Patient Position: Sitting, Cuff Size: Normal)   Pulse (!) 55   Ht 5' 7"  (1.702 m)   Wt 175 lb 6 oz (79.5 kg)   SpO2 98%   BMI 27.47 kg/m  , BMI Body mass index is 27.47 kg/m.  Constitutional:  oriented to person, place, and time. No distress.  HENT:  Head: Grossly normal Eyes:  no discharge. No scleral icterus.  Neck: No JVD, no carotid bruits  Cardiovascular: Regular rate and rhythm, no murmurs appreciated Pulmonary/Chest: Clear to auscultation bilaterally, no wheezes or rails Abdominal: Soft.  no distension.  no tenderness.  Musculoskeletal: Normal range of motion Neurological:  normal muscle tone. Coordination normal. No atrophy Skin: Skin warm and dry Psychiatric: normal affect, pleasant  Recent Labs: 08/04/2021: ALT 29; BUN 19; Creatinine, Ser 1.29; Hemoglobin 13.8; Platelets 163.0; Potassium 4.7; Sodium 140    Lipid Panel Lab Results  Component Value Date   CHOL 112 08/04/2021   HDL 34.20 (L) 08/04/2021   LDLCALC 48 08/04/2021   TRIG 148.0 08/04/2021      Wt Readings from Last 3 Encounters:  02/21/22 175 lb 6 oz (79.5 kg)  02/05/22 174 lb 6 oz (79.1 kg)  08/07/21 171 lb 6 oz (77.7 kg)     ASSESSMENT AND PLAN:   Coronary artery disease involving autologous vein coronary bypass graft without angina pectoris - Plan: EKG 12-Lead 2006 bypass surgery,, denies anginal symptoms Non-smoker, cholesterol at goal No further workup at this time. Continue current medication regimen.  Essential hypertension - Plan: EKG 12-Lead Blood pressure mildly elevated, numbers well controlled at home  We will decrease metoprolol for bradycardia and afternoon fatigue  Hyperlipidemia LDL goal <70 Cholesterol is at goal on the current lipid regimen. No changes to the medications were  made.  controlled type 2 diabetes mellitus with other circulatory complication, without long-term current use of insulin (HCC) Weight stable  Alcohol abuse Moderation of alcohol recommended  GERD stable  Smoker Quit years ago   Total encounter time more than 30 minutes  Greater than 50% was spent in counseling and coordination of care with the patient     Orders Placed This Encounter  Procedures   EKG 12-Lead     Signed, Esmond Plants, M.D., Ph.D. 02/21/2022  [place], [place]

## 2022-02-21 ENCOUNTER — Encounter: Payer: Self-pay | Admitting: Cardiovascular Disease

## 2022-02-21 ENCOUNTER — Ambulatory Visit: Payer: Medicare HMO | Admitting: Cardiovascular Disease

## 2022-02-21 VITALS — BP 140/80 | HR 55 | Ht 67.0 in | Wt 175.4 lb

## 2022-02-21 DIAGNOSIS — I25118 Atherosclerotic heart disease of native coronary artery with other forms of angina pectoris: Secondary | ICD-10-CM

## 2022-02-21 DIAGNOSIS — E782 Mixed hyperlipidemia: Secondary | ICD-10-CM

## 2022-02-21 DIAGNOSIS — I10 Essential (primary) hypertension: Secondary | ICD-10-CM

## 2022-02-21 DIAGNOSIS — Z789 Other specified health status: Secondary | ICD-10-CM

## 2022-02-21 DIAGNOSIS — E1165 Type 2 diabetes mellitus with hyperglycemia: Secondary | ICD-10-CM | POA: Diagnosis not present

## 2022-02-21 MED ORDER — METOPROLOL SUCCINATE ER 50 MG PO TB24: 50.0000 mg | ORAL_TABLET | Freq: Every day | ORAL | 3 refills | Status: DC

## 2022-02-21 NOTE — Patient Instructions (Addendum)
Medication Instructions:  Please change the metoprolol tartrate to metoprolol succinate 50 mg daily in the PM   If you need a refill on your cardiac medications before your next appointment, please call your pharmacy.   Lab work: No new labs needed  Testing/Procedures: No new testing needed  Follow-Up: At [facility], you and your health needs are our priority.  As part of our continuing mission to provide you with exceptional heart care, we have created designated Provider Care Teams.  These Care Teams include your primary Cardiologist (physician) and Advanced Practice Providers (APPs -  Physician Assistants and Nurse Practitioners) who all work together to provide you with the care you need, when you need it.  You will need a follow up appointment in 12 months  Providers on your designated Care Team:   Murray Hodgkins, NP Christell Faith, PA-C Cadence Kathlen Mody, [place]  COVID-19 Vaccine Information can be found at: [email or web address] For questions related to vaccine distribution or appointments, please email [email or web address] or call [phone].

## 2022-03-14 ENCOUNTER — Encounter: Payer: Self-pay | Admitting: Family Medicine

## 2022-03-27 DIAGNOSIS — H2511 Age-related nuclear cataract, right eye: Secondary | ICD-10-CM | POA: Diagnosis not present

## 2022-04-02 ENCOUNTER — Other Ambulatory Visit: Payer: Self-pay | Admitting: Cardiovascular Disease

## 2022-04-03 NOTE — Anesthesia Preprocedure Evaluation (Addendum)
Anesthesia Evaluation  Patient identified by MRN, date of birth, ID band Patient awake    Reviewed: Allergy & Precautions, NPO status , Patient's Chart, lab work & pertinent test results, reviewed documented beta blocker date and time   History of Anesthesia Complications Negative for: history of anesthetic complications  Airway Mallampati: III  TM Distance: >3 FB Neck ROM: full    Dental no notable dental hx.    Pulmonary neg pulmonary ROS, former smoker,    Pulmonary exam normal        Cardiovascular Exercise Tolerance: Good hypertension, Pt. on medications and Pt. on home beta blockers + CAD and + CABG (2006 x5 vessel )   + Systolic murmurs    Neuro/Psych negative neurological ROS  negative psych ROS   GI/Hepatic GERD  Controlled and Medicated,(+)     substance abuse  alcohol use,   Endo/Other  diabetes, Well Controlled, Type 2, Oral Hypoglycemic Agents  Renal/GU      Musculoskeletal   Abdominal   Peds  Hematology negative hematology ROS (+)   Anesthesia Other Findings Past Medical History: No date: Alcohol abuse, unspecified 9/06: CAD (coronary artery disease)     Comment:  CABG x4 No date: Colon polyps No date: Depression 10/03: DMII (diabetes mellitus, type 2) ([place]) 10/03: HLD (hyperlipidemia) 10/03: HTN (hypertension)  Past Surgical History: No date: COLONOSCOPY 9/06: CORONARY ARTERY BYPASS GRAFT     Comment:  x4 7/07: ESOPHAGOGASTRODUODENOSCOPY     Comment:  polyp; mucosal abnml No date: TONSILLECTOMY     Reproductive/Obstetrics negative OB ROS                            Anesthesia Physical Anesthesia Plan  ASA: 3  Anesthesia Plan: MAC   Post-op Pain Management:    Induction: Intravenous  PONV Risk Score and Plan:   Airway Management Planned: Natural Airway  Additional Equipment:   Intra-op Plan:   Post-operative Plan:   Informed Consent: I have  reviewed the patients History and Physical, chart, labs and discussed the procedure including the risks, benefits and alternatives for the proposed anesthesia with the patient or authorized representative who has indicated his/her understanding and acceptance.       Plan Discussed with: Anesthesiologist, CRNA and Surgeon  Anesthesia Plan Comments:       Anesthesia Quick Evaluation

## 2022-04-04 ENCOUNTER — Encounter: Payer: Self-pay | Admitting: Ophthalmology

## 2022-04-08 ENCOUNTER — Other Ambulatory Visit: Payer: Self-pay | Admitting: Family Medicine

## 2022-04-10 NOTE — Discharge Instructions (Signed)

## 2022-04-12 ENCOUNTER — Encounter: Payer: Self-pay | Admitting: Ophthalmology

## 2022-04-12 ENCOUNTER — Ambulatory Visit (AMBULATORY_SURGERY_CENTER): Payer: Medicare HMO | Admitting: Anesthesiology

## 2022-04-12 ENCOUNTER — Ambulatory Visit: Payer: Medicare HMO | Admitting: Anesthesiology

## 2022-04-12 ENCOUNTER — Encounter
Admission: RE | Disposition: A | Discharge status: Home / Self Care | Payer: Self-pay | Source: Home / Self Care | Attending: Ophthalmology

## 2022-04-12 ENCOUNTER — Ambulatory Visit
Admission: RE | Admit: 2022-04-12 | Discharge: 2022-04-12 | Disposition: A | Discharge status: Home / Self Care | Payer: Medicare HMO | Attending: Ophthalmology | Admitting: Ophthalmology

## 2022-04-12 ENCOUNTER — Other Ambulatory Visit: Payer: Self-pay

## 2022-04-12 DIAGNOSIS — Z79899 Other long term (current) drug therapy: Secondary | ICD-10-CM | POA: Insufficient documentation

## 2022-04-12 DIAGNOSIS — K219 Gastro-esophageal reflux disease without esophagitis: Secondary | ICD-10-CM | POA: Diagnosis not present

## 2022-04-12 DIAGNOSIS — Z7984 Long term (current) use of oral hypoglycemic drugs: Secondary | ICD-10-CM | POA: Insufficient documentation

## 2022-04-12 DIAGNOSIS — Z951 Presence of aortocoronary bypass graft: Secondary | ICD-10-CM | POA: Insufficient documentation

## 2022-04-12 DIAGNOSIS — E1136 Type 2 diabetes mellitus with diabetic cataract: Secondary | ICD-10-CM | POA: Diagnosis not present

## 2022-04-12 DIAGNOSIS — I10 Essential (primary) hypertension: Secondary | ICD-10-CM | POA: Insufficient documentation

## 2022-04-12 DIAGNOSIS — Z8249 Family history of ischemic heart disease and other diseases of the circulatory system: Secondary | ICD-10-CM | POA: Insufficient documentation

## 2022-04-12 DIAGNOSIS — H2511 Age-related nuclear cataract, right eye: Secondary | ICD-10-CM

## 2022-04-12 DIAGNOSIS — E119 Type 2 diabetes mellitus without complications: Secondary | ICD-10-CM

## 2022-04-12 DIAGNOSIS — Z87891 Personal history of nicotine dependence: Secondary | ICD-10-CM | POA: Diagnosis not present

## 2022-04-12 DIAGNOSIS — I2510 Atherosclerotic heart disease of native coronary artery without angina pectoris: Secondary | ICD-10-CM | POA: Diagnosis not present

## 2022-04-12 HISTORY — PX: CATARACT EXTRACTION W/PHACO: SHX586

## 2022-04-12 HISTORY — DX: Motion sickness, initial encounter: T75.3XXA

## 2022-04-12 LAB — GLUCOSE, CAPILLARY
Glucose-Capillary: 196 mg/dL — ABNORMAL HIGH (ref 70–99)
Glucose-Capillary: 183 mg/dL — ABNORMAL HIGH (ref 70–99)

## 2022-04-12 SURGERY — PHACOEMULSIFICATION, CATARACT, WITH IOL INSERTION
Anesthesia: Monitor Anesthesia Care | Site: Eye | Laterality: Right

## 2022-04-12 MED ORDER — TETRACAINE HCL 0.5 % OP SOLN
1.0000 [drp] | OPHTHALMIC | Status: DC | PRN
  Administered 2022-04-12 (×3): 1 [drp] via OPHTHALMIC

## 2022-04-12 MED ORDER — ARMC OPHTHALMIC DILATING DROPS
1.0000 | OPHTHALMIC | Status: DC | PRN
  Administered 2022-04-12 (×3): 1 via OPHTHALMIC

## 2022-04-12 MED ORDER — MOXIFLOXACIN HCL 0.5 % OP SOLN
OPHTHALMIC | Status: DC | PRN
  Administered 2022-04-12: 0.2 mL via OPHTHALMIC

## 2022-04-12 MED ORDER — BRIMONIDINE TARTRATE-TIMOLOL 0.2-0.5 % OP SOLN
OPHTHALMIC | Status: DC | PRN
  Administered 2022-04-12: 1 [drp] via OPHTHALMIC

## 2022-04-12 MED ORDER — SIGHTPATH DOSE#1 BSS IO SOLN
INTRAOCULAR | Status: DC | PRN
  Administered 2022-04-12: 15 mL

## 2022-04-12 MED ORDER — PHENYLEPHRINE-KETOROLAC 1-0.3 % IO SOLN
INTRAOCULAR | Status: DC | PRN
  Administered 2022-04-12: 74 mL via OPHTHALMIC

## 2022-04-12 MED ORDER — FENTANYL CITRATE (PF) 100 MCG/2ML IJ SOLN
INTRAMUSCULAR | Status: DC | PRN
Start: 2022-04-12 — End: 2022-04-12
  Administered 2022-04-12: 25 ug via INTRAVENOUS
  Administered 2022-04-12: 50 ug via INTRAVENOUS

## 2022-04-12 MED ORDER — LIDOCAINE HCL (PF) 2 % IJ SOLN
INTRAOCULAR | Status: DC | PRN
  Administered 2022-04-12: 1 mL via INTRAOCULAR

## 2022-04-12 MED ORDER — SIGHTPATH DOSE#1 NA HYALUR & NA CHOND-NA HYALUR IO KIT
PACK | INTRAOCULAR | Status: DC | PRN
  Administered 2022-04-12: 1 via OPHTHALMIC

## 2022-04-12 MED ORDER — MIDAZOLAM HCL 2 MG/2ML IJ SOLN
INTRAMUSCULAR | Status: DC | PRN
  Administered 2022-04-12 (×2): 1 mg via INTRAVENOUS

## 2022-04-12 SURGICAL SUPPLY — 23 items
CANNULA ANT/CHMB 27G (MISCELLANEOUS) IMPLANT
CANNULA ANT/CHMB 27GA (MISCELLANEOUS) IMPLANT
CATARACT SUITE SIGHTPATH (MISCELLANEOUS) ×1 IMPLANT
DISSECTOR HYDRO NUCLEUS 50X22 (MISCELLANEOUS) ×1 IMPLANT
DRSG TEGADERM 2-3/8X2-3/4 SM (GAUZE/BANDAGES/DRESSINGS) ×1 IMPLANT
FEE CATARACT SUITE SIGHTPATH (MISCELLANEOUS) ×1 IMPLANT
GLOVE SURG SYN 7.5  E (GLOVE) ×1
GLOVE SURG SYN 7.5 E (GLOVE) ×1 IMPLANT
GLOVE SURG SYN 7.5 PF PI (GLOVE) ×1 IMPLANT
GLOVE SURG SYN 8.5  E (GLOVE) ×1
GLOVE SURG SYN 8.5 E (GLOVE) ×1 IMPLANT
GLOVE SURG SYN 8.5 PF PI (GLOVE) ×1 IMPLANT
LENS IOL TECNIS EYHANCE 22.0 (Intraocular Lens) IMPLANT
NDL FILTER BLUNT 18X1 1/2 (NEEDLE) IMPLANT
NEEDLE FILTER BLUNT 18X 1/2SAF (NEEDLE)
NEEDLE FILTER BLUNT 18X1 1/2 (NEEDLE) IMPLANT
PACK VIT ANT 23G (MISCELLANEOUS) IMPLANT
RING MALYGIN (MISCELLANEOUS) IMPLANT
SUT ETHILON 10-0 CS-B-6CS-B-6 (SUTURE)
SUTURE EHLN 10-0 CS-B-6CS-B-6 (SUTURE) IMPLANT
SYR 3ML LL SCALE MARK (SYRINGE) IMPLANT
SYR 5ML LL (SYRINGE) IMPLANT
WATER STERILE IRR 250ML POUR (IV SOLUTION) ×1 IMPLANT

## 2022-04-12 NOTE — Anesthesia Postprocedure Evaluation (Signed)
Anesthesia Post Note  Patient: [name]  Procedure(s) Performed: CATARACT EXTRACTION PHACO AND INTRAOCULAR LENS PLACEMENT (IOC) RIGHT MALYUGIN OMIDRIA IRIS HOOKS (Right: Eye)     Patient location during evaluation: PACU Anesthesia Type: MAC Level of consciousness: awake and alert Pain management: pain level controlled Vital Signs Assessment: post-procedure vital signs reviewed and stable Respiratory status: spontaneous breathing, nonlabored ventilation and respiratory function stable Cardiovascular status: blood pressure returned to baseline and stable Postop Assessment: no apparent nausea or vomiting Anesthetic complications: no   No notable events documented.  [place] Ouch

## 2022-04-12 NOTE — Op Note (Signed)
OPERATIVE NOTE  [name] [id number] 04/12/2022   PREOPERATIVE DIAGNOSIS: Nuclear sclerotic cataract right eye. H25.11   POSTOPERATIVE DIAGNOSIS: Nuclear sclerotic cataract right eye. H25.11   PROCEDURE:  Phacoemusification with posterior chamber intraocular lens placement of the right eye  Ultrasound time: Procedure(s) with comments: CATARACT EXTRACTION PHACO AND INTRAOCULAR LENS PLACEMENT (IOC) RIGHT MALYUGIN OMIDRIA IRIS HOOKS (Right) - 16.16 1:33.8  LENS:   Implant Name Type Inv. Item Serial No. Manufacturer Lot No. LRB No. Used Action  LENS IOL TECNIS EYHANCE 22.0 - [id number] Intraocular Lens LENS IOL TECNIS EYHANCE 22.0 [id number] [employer]  Right 1 Implanted      SURGEON:  Courtney Heys. Lazarus Salines, MD   ANESTHESIA:  Topical with tetracaine drops, augmented with 1% preservative-free intracameral lidocaine.   COMPLICATIONS:  None.   DESCRIPTION OF PROCEDURE:  The patient was identified in the holding room and transported to the operating room and placed in the supine position under the operating microscope.  The right eye was identified as the operative eye, which was prepped and draped in the usual sterile ophthalmic fashion.   A 1 millimeter clear-corneal paracentesis was made superotemporally. Preservative-free 1% lidocaine mixed with 1:1,000 bisulfite-free aqueous solution of epinephrine was injected into the anterior chamber. The anterior chamber was then filled with Viscoat viscoelastic. A 2.4 millimeter keratome was used to make a clear-corneal incision inferotemporally. A curvilinear capsulorrhexis was made with a cystotome and capsulorrhexis forceps. Balanced salt solution was used to hydrodissect and hydrodelineate the nucleus. Phacoemulsification was then used to remove the lens nucleus and epinucleus. The remaining cortex was then removed using the irrigation and aspiration handpiece. Provisc was then placed into the capsular bag to distend it for lens placement. A +22.00  DIB00 intraocular lens was then injected into the capsular bag. The remaining viscoelastic was aspirated.   Wounds were hydrated with balanced salt solution.  The anterior chamber was inflated to a physiologic pressure with balanced salt solution.  No wound leaks were noted. Vigamox was injected intracamerally.  Timolol and Brimonidine drops were applied to the eye.  The patient was taken to the recovery room in stable condition without complications of anesthesia or surgery.  [name] 04/12/2022, 10:33 AM

## 2022-04-12 NOTE — Transfer of Care (Signed)
Immediate Anesthesia Transfer of Care Note  Patient: [name]  Procedure(s) Performed: CATARACT EXTRACTION PHACO AND INTRAOCULAR LENS PLACEMENT (IOC) RIGHT MALYUGIN OMIDRIA IRIS HOOKS (Right: Eye)  Patient Location: PACU  Anesthesia Type: MAC  Level of Consciousness: awake, alert  and patient cooperative  Airway and Oxygen Therapy: Patient Spontanous Breathing and Patient connected to supplemental oxygen  Post-op Assessment: Post-op Vital signs reviewed, Patient's Cardiovascular Status Stable, Respiratory Function Stable, Patent Airway and No signs of Nausea or vomiting  Post-op Vital Signs: Reviewed and stable  Complications: No notable events documented.

## 2022-04-12 NOTE — H&P (Signed)
[facility]   Primary Care Physician:  [name], MD Ophthalmologist: Dr. Merleen Nicely  Pre-Procedure History & Physical: HPI:  [name] is a 76 y.o. male here for cataract surgery.   Past Medical History:  Diagnosis Date   Alcohol abuse, unspecified    CAD (coronary artery disease) 04/20/2005   CABG x4   Colon polyps    Depression    DMII (diabetes mellitus, type 2) ([place]) 05/20/2002   HLD (hyperlipidemia) 05/20/2002   HTN (hypertension) 05/20/2002   Motion sickness    planes, deep sea fishing    Past Surgical History:  Procedure Laterality Date   COLONOSCOPY     CORONARY ARTERY BYPASS GRAFT  9/06   x4   ESOPHAGOGASTRODUODENOSCOPY  7/07   polyp; mucosal abnml   TONSILLECTOMY      Prior to Admission medications   Medication Sig Start Date End Date Taking? Authorizing Provider  amLODipine (NORVASC) 10 MG tablet TAKE 1 TABLET BY MOUTH EVERY DAY 10/24/21  Yes Gollan, Kathlene November, MD  aspirin 81 MG tablet Take 1 tablet (81 mg total) by mouth daily. 06/11/17  Yes Gollan, Kathlene November, MD  atorvastatin (LIPITOR) 80 MG tablet TAKE 1 TABLET(80 MG) BY MOUTH DAILY 09/24/21  Yes Copland, Frederico Hamman, MD  calcium carbonate (TUMS - DOSED IN MG ELEMENTAL CALCIUM) 500 MG chewable tablet Chew 1 tablet by mouth daily as needed for indigestion or heartburn.   Yes [provider]  doxazosin (CARDURA) 1 MG tablet TAKE 1 TABLET(1 MG) BY MOUTH DAILY 04/02/22  Yes Gollan, Kathlene November, MD  fish oil-omega-3 fatty acids 1000 MG capsule Take 2 g by mouth daily.   Yes [provider]  losartan (COZAAR) 100 MG tablet TAKE 1 TABLET(100 MG) BY MOUTH DAILY 01/15/22  Yes Copland, Frederico Hamman, MD  metFORMIN (GLUCOPHAGE-XR) 500 MG 24 hr tablet TAKE 4 TABLETS BY MOUTH EVERY DAY WITH BREAKFAST 04/09/22  Yes Copland, Frederico Hamman, MD  metoprolol succinate (TOPROL-XL) 50 MG 24 hr tablet Take 1 tablet (50 mg total) by mouth daily. Take with or immediately following a meal. 02/21/22  Yes Gollan, Kathlene November, MD  Multiple Vitamin (MULTIVITAMIN) tablet Take 1 tablet by mouth daily.   Yes [provider]  ACCU-CHEK SOFTCLIX LANCETS lancets Check blood sugar once daily and as instructed. Dx E11.65 06/18/17   [name], MD  Blood Glucose Monitoring Suppl (ACCU-CHEK AVIVA PLUS) w/Device KIT Check blood sugar once daily and as instructed. Dx E11.65 06/18/17   [name], MD  glucose blood (ACCU-CHEK AVIVA PLUS) test strip Check blood sugar once daily and as instructed. Dx E11.65 06/18/17   [name], MD    Allergies as of 02/08/2022 - Review Complete 02/05/2022  Allergen Reaction Noted   Sulfonamide derivatives      Family History  Problem Relation Age of Onset   Alcohol abuse Father    Stroke Father    Other Father        cardiac problems   Heart disease Father    COPD Mother    Alcohol abuse Brother    Cirrhosis Brother        EtOH   Anxiety disorder Brother    Heart disease Paternal Grandmother    Colon cancer Neg Hx    Esophageal cancer Neg Hx    Rectal cancer Neg Hx    Stomach cancer Neg Hx     Social History   Socioeconomic History   Marital status: Significant Other    Spouse name: Not  on file   Number of children: Not on file   Years of education: Not on file   Highest education level: Not on file  Occupational History   Not on file  Tobacco Use   Smoking status: Former   Smokeless tobacco: Never   Tobacco comments:    quit 41 years ago  Vaping Use   Vaping Use: Never used  Substance and Sexual Activity   Alcohol use: Yes    Alcohol/week: 17.0 standard drinks of alcohol    Types: 2 Glasses of wine, 15 Shots of liquor per week    Comment: 2 glasses of wine or 3-4 shots of bourbon daily   Drug use: No   Sexual activity: Not on file  Other Topics Concern   Not on file  Social History Narrative   Wife Eustaquio Maize is deceased   He is retired.    Enjoys firearms and recreational shooting.    Social Determinants of Health   Financial  Resource Strain: Low Risk  (08/02/2021)   Overall Financial Resource Strain (CARDIA)    Difficulty of Paying Living Expenses: Not hard at all  Food Insecurity: No Food Insecurity (08/02/2021)   Hunger Vital Sign    Worried About Running Out of Food in the Last Year: Never true    Ran Out of Food in the Last Year: Never true  Transportation Needs: No Transportation Needs (08/02/2021)   PRAPARE - Hydrologist (Medical): No    Lack of Transportation (Non-Medical): No  Physical Activity: Sufficiently Active (08/02/2021)   Exercise Vital Sign    Days of Exercise per Week: 7 days    Minutes of Exercise per Session: 60 min  Stress: No Stress Concern Present (08/02/2021)   [place]    Feeling of Stress : Only a little  Social Connections: Socially Integrated (08/02/2021)   Social Connection and Isolation Panel [NHANES]    Frequency of Communication with Friends and Family: Twice a week    Frequency of Social Gatherings with Friends and Family: More than three times a week    Attends Religious Services: More than 4 times per year    Active Member of [employer] or Organizations: Yes    Attends Archivist Meetings: More than 4 times per year    Marital Status: Living with partner  Intimate Partner Violence: Not At Risk (08/02/2021)   Humiliation, Afraid, Rape, and Kick questionnaire    Fear of Current or Ex-Partner: No    Emotionally Abused: No    Physically Abused: No    Sexually Abused: No    Review of Systems: See HPI, otherwise negative ROS  Physical Exam: BP (!) 162/79   Pulse 60   Temp 98 F (36.7 C) (Temporal)   Resp 18   Ht _0  (1.676 m)   Wt 78 kg   SpO2 97%   BMI 27.76 kg/m  General:   Alert, cooperative in NAD Head:  Normocephalic and atraumatic. Respiratory:  Normal work of breathing. Cardiovascular:  RRR  Impression/Plan: [name] is here for cataract  surgery.  Risks, benefits, limitations, and alternatives regarding cataract surgery have been reviewed with the patient.  Questions have been answered.  All parties agreeable.   Norvel Richards, MD  04/12/2022, 9:07 AM

## 2022-04-13 ENCOUNTER — Encounter: Payer: Self-pay | Admitting: Ophthalmology

## 2022-04-13 ENCOUNTER — Other Ambulatory Visit: Payer: Self-pay

## 2022-04-24 DIAGNOSIS — H2512 Age-related nuclear cataract, left eye: Secondary | ICD-10-CM | POA: Diagnosis not present

## 2022-04-24 NOTE — Discharge Instructions (Signed)

## 2022-04-26 ENCOUNTER — Ambulatory Visit
Admission: RE | Admit: 2022-04-26 | Discharge: 2022-04-26 | Disposition: A | Discharge status: Home / Self Care | Payer: Medicare HMO | Attending: Ophthalmology | Admitting: Ophthalmology

## 2022-04-26 ENCOUNTER — Other Ambulatory Visit: Payer: Self-pay

## 2022-04-26 ENCOUNTER — Ambulatory Visit: Payer: Medicare HMO | Admitting: General Practice

## 2022-04-26 ENCOUNTER — Encounter
Admission: RE | Disposition: A | Discharge status: Home / Self Care | Payer: Self-pay | Source: Home / Self Care | Attending: Ophthalmology

## 2022-04-26 ENCOUNTER — Encounter: Payer: Self-pay | Admitting: Ophthalmology

## 2022-04-26 DIAGNOSIS — I10 Essential (primary) hypertension: Secondary | ICD-10-CM | POA: Diagnosis not present

## 2022-04-26 DIAGNOSIS — Z87891 Personal history of nicotine dependence: Secondary | ICD-10-CM | POA: Insufficient documentation

## 2022-04-26 DIAGNOSIS — Z951 Presence of aortocoronary bypass graft: Secondary | ICD-10-CM | POA: Diagnosis not present

## 2022-04-26 DIAGNOSIS — H2512 Age-related nuclear cataract, left eye: Secondary | ICD-10-CM | POA: Insufficient documentation

## 2022-04-26 DIAGNOSIS — I2510 Atherosclerotic heart disease of native coronary artery without angina pectoris: Secondary | ICD-10-CM | POA: Insufficient documentation

## 2022-04-26 DIAGNOSIS — E1136 Type 2 diabetes mellitus with diabetic cataract: Secondary | ICD-10-CM | POA: Insufficient documentation

## 2022-04-26 HISTORY — PX: CATARACT EXTRACTION W/PHACO: SHX586

## 2022-04-26 LAB — GLUCOSE, CAPILLARY
Glucose-Capillary: 168 mg/dL — ABNORMAL HIGH (ref 70–99)
Glucose-Capillary: 143 mg/dL — ABNORMAL HIGH (ref 70–99)

## 2022-04-26 SURGERY — PHACOEMULSIFICATION, CATARACT, WITH IOL INSERTION
Anesthesia: Monitor Anesthesia Care | Site: Eye | Laterality: Left

## 2022-04-26 MED ORDER — SIGHTPATH DOSE#1 BSS IO SOLN
INTRAOCULAR | Status: DC | PRN
  Administered 2022-04-26: 136 mL via OPHTHALMIC

## 2022-04-26 MED ORDER — FENTANYL CITRATE PF 50 MCG/ML IJ SOSY: 25.0000 ug | PREFILLED_SYRINGE | INTRAMUSCULAR | Status: DC | PRN

## 2022-04-26 MED ORDER — BRIMONIDINE TARTRATE-TIMOLOL 0.2-0.5 % OP SOLN
OPHTHALMIC | Status: DC | PRN
  Administered 2022-04-26: 1 [drp] via OPHTHALMIC

## 2022-04-26 MED ORDER — TETRACAINE HCL 0.5 % OP SOLN
1.0000 [drp] | OPHTHALMIC | Status: DC | PRN
  Administered 2022-04-26 (×3): 1 [drp] via OPHTHALMIC

## 2022-04-26 MED ORDER — SIGHTPATH DOSE#1 BSS IO SOLN
INTRAOCULAR | Status: DC | PRN
  Administered 2022-04-26: 15 mL

## 2022-04-26 MED ORDER — LACTATED RINGERS IV SOLN: INTRAVENOUS | Status: DC

## 2022-04-26 MED ORDER — SIGHTPATH DOSE#1 NA HYALUR & NA CHOND-NA HYALUR IO KIT
PACK | INTRAOCULAR | Status: DC | PRN
  Administered 2022-04-26: 1 via OPHTHALMIC

## 2022-04-26 MED ORDER — FENTANYL CITRATE (PF) 100 MCG/2ML IJ SOLN
INTRAMUSCULAR | Status: DC | PRN
  Administered 2022-04-26: 100 ug via INTRAVENOUS

## 2022-04-26 MED ORDER — TETRACAINE 0.5 % OP SOLN OPTIME - NO CHARGE
OPHTHALMIC | Status: DC | PRN
  Administered 2022-04-26: 2 [drp] via OPHTHALMIC

## 2022-04-26 MED ORDER — ONDANSETRON HCL 4 MG/2ML IJ SOLN
4.0000 mg | Freq: Once | INTRAMUSCULAR | Status: DC | PRN
Start: 2022-04-26 — End: 2022-04-26

## 2022-04-26 MED ORDER — LIDOCAINE HCL (PF) 2 % IJ SOLN
INTRAOCULAR | Status: DC | PRN
  Administered 2022-04-26: 1 mL via INTRAOCULAR

## 2022-04-26 MED ORDER — MIDAZOLAM HCL 2 MG/2ML IJ SOLN
INTRAMUSCULAR | Status: DC | PRN
  Administered 2022-04-26: 2 mg via INTRAVENOUS

## 2022-04-26 MED ORDER — ARMC OPHTHALMIC DILATING DROPS
1.0000 | OPHTHALMIC | Status: DC | PRN
  Administered 2022-04-26 (×3): 1 via OPHTHALMIC

## 2022-04-26 MED ORDER — MOXIFLOXACIN HCL 0.5 % OP SOLN
OPHTHALMIC | Status: DC | PRN
  Administered 2022-04-26: 0.2 mL via OPHTHALMIC

## 2022-04-26 SURGICAL SUPPLY — 23 items
CANNULA ANT/CHMB 27G (MISCELLANEOUS) IMPLANT
CANNULA ANT/CHMB 27GA (MISCELLANEOUS) IMPLANT
CATARACT SUITE SIGHTPATH (MISCELLANEOUS) ×1 IMPLANT
DISSECTOR HYDRO NUCLEUS 50X22 (MISCELLANEOUS) ×1 IMPLANT
DRSG TEGADERM 2-3/8X2-3/4 SM (GAUZE/BANDAGES/DRESSINGS) ×1 IMPLANT
FEE CATARACT SUITE SIGHTPATH (MISCELLANEOUS) ×1 IMPLANT
GLOVE SURG SYN 7.5  E (GLOVE) ×1
GLOVE SURG SYN 7.5 E (GLOVE) ×1 IMPLANT
GLOVE SURG SYN 7.5 PF PI (GLOVE) ×1 IMPLANT
GLOVE SURG SYN 8.5  E (GLOVE) ×1
GLOVE SURG SYN 8.5 E (GLOVE) ×1 IMPLANT
GLOVE SURG SYN 8.5 PF PI (GLOVE) ×1 IMPLANT
LENS IOL TECNIS EYHANCE 21.5 (Intraocular Lens) IMPLANT
NDL FILTER BLUNT 18X1 1/2 (NEEDLE) IMPLANT
NEEDLE FILTER BLUNT 18X 1/2SAF (NEEDLE)
NEEDLE FILTER BLUNT 18X1 1/2 (NEEDLE) IMPLANT
PACK VIT ANT 23G (MISCELLANEOUS) IMPLANT
RING MALYGIN (MISCELLANEOUS) IMPLANT
SUT ETHILON 10-0 CS-B-6CS-B-6 (SUTURE)
SUTURE EHLN 10-0 CS-B-6CS-B-6 (SUTURE) IMPLANT
SYR 3ML LL SCALE MARK (SYRINGE) IMPLANT
SYR 5ML LL (SYRINGE) IMPLANT
WATER STERILE IRR 250ML POUR (IV SOLUTION) ×1 IMPLANT

## 2022-04-26 NOTE — H&P (Signed)
[facility]   Primary Care Physician:  [name], MD Ophthalmologist: Dr. Merleen Nicely  Pre-Procedure History & Physical: HPI:  [name] is a 76 y.o. male here for cataract surgery.   Past Medical History:  Diagnosis Date   Alcohol abuse, unspecified    CAD (coronary artery disease) 04/20/2005   CABG x4   Colon polyps    Depression    DMII (diabetes mellitus, type 2) ([place]) 05/20/2002   HLD (hyperlipidemia) 05/20/2002   HTN (hypertension) 05/20/2002   Motion sickness    planes, deep sea fishing    Past Surgical History:  Procedure Laterality Date   CATARACT EXTRACTION W/PHACO Right 04/12/2022   Procedure: CATARACT EXTRACTION PHACO AND INTRAOCULAR LENS PLACEMENT ([place]) RIGHT MALYUGIN OMIDRIA IRIS HOOKS;  Surgeon: Norvel Richards, MD;  Location: [place];  Service: Ophthalmology;  Laterality: Right;  16.16 1:33.8   COLONOSCOPY     CORONARY ARTERY BYPASS GRAFT  9/06   x4   ESOPHAGOGASTRODUODENOSCOPY  7/07   polyp; mucosal abnml   TONSILLECTOMY      Prior to Admission medications   Medication Sig Start Date End Date Taking? Authorizing Provider  amLODipine (NORVASC) 10 MG tablet TAKE 1 TABLET BY MOUTH EVERY DAY 10/24/21  Yes Gollan, Kathlene November, MD  aspirin 81 MG tablet Take 1 tablet (81 mg total) by mouth daily. 06/11/17  Yes Gollan, Kathlene November, MD  atorvastatin (LIPITOR) 80 MG tablet TAKE 1 TABLET(80 MG) BY MOUTH DAILY 09/24/21  Yes Copland, Frederico Hamman, MD  calcium carbonate (TUMS - DOSED IN MG ELEMENTAL CALCIUM) 500 MG chewable tablet Chew 1 tablet by mouth daily as needed for indigestion or heartburn.   Yes [provider]  doxazosin (CARDURA) 1 MG tablet TAKE 1 TABLET(1 MG) BY MOUTH DAILY 04/02/22  Yes Gollan, Kathlene November, MD  fish oil-omega-3 fatty acids 1000 MG capsule Take 2 g by mouth daily.   Yes [provider]  losartan (COZAAR) 100 MG tablet TAKE 1 TABLET(100 MG) BY MOUTH DAILY 01/15/22  Yes Copland, Frederico Hamman, MD  metFORMIN  (GLUCOPHAGE-XR) 500 MG 24 hr tablet TAKE 4 TABLETS BY MOUTH EVERY DAY WITH BREAKFAST 04/09/22  Yes Copland, Frederico Hamman, MD  metoprolol succinate (TOPROL-XL) 50 MG 24 hr tablet Take 1 tablet (50 mg total) by mouth daily. Take with or immediately following a meal. 02/21/22  Yes Gollan, Kathlene November, MD  Multiple Vitamin (MULTIVITAMIN) tablet Take 1 tablet by mouth daily.   Yes [provider]  ACCU-CHEK SOFTCLIX LANCETS lancets Check blood sugar once daily and as instructed. Dx E11.65 06/18/17   [name], MD  Blood Glucose Monitoring Suppl (ACCU-CHEK AVIVA PLUS) w/Device KIT Check blood sugar once daily and as instructed. Dx E11.65 06/18/17   [name], MD  glucose blood (ACCU-CHEK AVIVA PLUS) test strip Check blood sugar once daily and as instructed. Dx E11.65 06/18/17   [name], MD    Allergies as of 02/08/2022 - Review Complete 02/05/2022  Allergen Reaction Noted   Sulfonamide derivatives      Family History  Problem Relation Age of Onset   Alcohol abuse Father    Stroke Father    Other Father        cardiac problems   Heart disease Father    COPD Mother    Alcohol abuse Brother    Cirrhosis Brother        EtOH   Anxiety disorder Brother    Heart disease Paternal Grandmother    Colon cancer Neg Hx  Esophageal cancer Neg Hx    Rectal cancer Neg Hx    Stomach cancer Neg Hx     Social History   Socioeconomic History   Marital status: Significant Other    Spouse name: Not on file   Number of children: Not on file   Years of education: Not on file   Highest education level: Not on file  Occupational History   Not on file  Tobacco Use   Smoking status: Former   Smokeless tobacco: Never   Tobacco comments:    quit 41 years ago  Vaping Use   Vaping Use: Never used  Substance and Sexual Activity   Alcohol use: Yes    Alcohol/week: 17.0 standard drinks of alcohol    Types: 2 Glasses of wine, 15 Shots of liquor per week    Comment: 2 glasses of  wine or 3-4 shots of bourbon daily   Drug use: No   Sexual activity: Not on file  Other Topics Concern   Not on file  Social History Narrative   Wife Eustaquio Maize is deceased   He is retired.    Enjoys firearms and recreational shooting.    Social Determinants of Health   Financial Resource Strain: Low Risk  (08/02/2021)   Overall Financial Resource Strain (CARDIA)    Difficulty of Paying Living Expenses: Not hard at all  Food Insecurity: No Food Insecurity (08/02/2021)   Hunger Vital Sign    Worried About Running Out of Food in the Last Year: Never true    Ran Out of Food in the Last Year: Never true  Transportation Needs: No Transportation Needs (08/02/2021)   PRAPARE - Hydrologist (Medical): No    Lack of Transportation (Non-Medical): No  Physical Activity: Sufficiently Active (08/02/2021)   Exercise Vital Sign    Days of Exercise per Week: 7 days    Minutes of Exercise per Session: 60 min  Stress: No Stress Concern Present (08/02/2021)   [place]    Feeling of Stress : Only a little  Social Connections: Socially Integrated (08/02/2021)   Social Connection and Isolation Panel [NHANES]    Frequency of Communication with Friends and Family: Twice a week    Frequency of Social Gatherings with Friends and Family: More than three times a week    Attends Religious Services: More than 4 times per year    Active Member of [employer] or Organizations: Yes    Attends Archivist Meetings: More than 4 times per year    Marital Status: Living with partner  Intimate Partner Violence: Not At Risk (08/02/2021)   Humiliation, Afraid, Rape, and Kick questionnaire    Fear of Current or Ex-Partner: No    Emotionally Abused: No    Physically Abused: No    Sexually Abused: No    Review of Systems: See HPI, otherwise negative ROS  Physical Exam: BP (!) 147/76   Temp 98.1 F (36.7 C)  (Tympanic)   Ht 5' 5.98" (1.676 m)   Wt 77.2 kg   SpO2 98%   BMI 27.50 kg/m  General:   Alert, cooperative in NAD Head:  Normocephalic and atraumatic. Respiratory:  Normal work of breathing. Cardiovascular:  RRR  Impression/Plan: Norvel Wenker Quinton is here for cataract surgery.  Risks, benefits, limitations, and alternatives regarding cataract surgery have been reviewed with the patient.  Questions have been answered.  All parties agreeable.   [place],  MD  04/26/2022, 11:59 AM

## 2022-04-26 NOTE — Transfer of Care (Signed)
Immediate Anesthesia Transfer of Care Note  Patient: [name]  Procedure(s) Performed: CATARACT EXTRACTION PHACO AND INTRAOCULAR LENS PLACEMENT ([facility]) LEFT MALYUGIN OMIDIRA IRIS HOOKS (Left: Eye)  Patient Location: PACU  Anesthesia Type: MAC  Level of Consciousness: awake, alert  and patient cooperative  Airway and Oxygen Therapy: Patient Spontanous Breathing and Patient connected to supplemental oxygen  Post-op Assessment: Post-op Vital signs reviewed, Patient's Cardiovascular Status Stable, Respiratory Function Stable, Patent Airway and No signs of Nausea or vomiting  Post-op Vital Signs: Reviewed and stable  Complications: No notable events documented.

## 2022-04-26 NOTE — Op Note (Signed)
OPERATIVE NOTE  KAYMAN SNUFFER [id number] 04/26/2022   PREOPERATIVE DIAGNOSIS: Nuclear sclerotic cataract left eye. H25.12   POSTOPERATIVE DIAGNOSIS: Nuclear sclerotic cataract left eye. H25.12   PROCEDURE:  Phacoemusification with posterior chamber intraocular lens placement of the left eye  Ultrasound time: Procedure(s) with comments: CATARACT EXTRACTION PHACO AND INTRAOCULAR LENS PLACEMENT ([facility]) LEFT MALYUGIN OMIDIRA IRIS HOOKS (Left) - 14.02 2:13.8  LENS:   Implant Name Type Inv. Item Serial No. Manufacturer Lot No. LRB No. Used Action  LENS IOL TECNIS EYHANCE 21.5 - [id number] Intraocular Lens LENS IOL TECNIS EYHANCE 21.5 [id number] [employer]  Left 1 Implanted      SURGEON:  Courtney Heys. Lazarus Salines, MD   ANESTHESIA:  Topical with tetracaine drops, augmented with 1% preservative-free intracameral lidocaine.   COMPLICATIONS:  None.   DESCRIPTION OF PROCEDURE:  The patient was identified in the holding room and transported to the operating room and placed in the supine position under the operating microscope.  The left eye was identified as the operative eye, which was prepped and draped in the usual sterile ophthalmic fashion.   A 1 millimeter clear-corneal paracentesis was made inferotemporally. Preservative-free 1% lidocaine mixed with 1:1,000 bisulfite-free aqueous solution of epinephrine was injected into the anterior chamber. The anterior chamber was then filled with Viscoat viscoelastic. A 2.4 millimeter keratome was used to make a clear-corneal incision superotemporally. A curvilinear capsulorrhexis was made with a cystotome and capsulorrhexis forceps. Balanced salt solution was used to hydrodissect and hydrodelineate the nucleus. Phacoemulsification was then used to remove the lens nucleus and epinucleus. The remaining cortex was then removed using the irrigation and aspiration handpiece. Provisc was then placed into the capsular bag to distend it for lens placement. A+21.50 DIB00  intraocular lens was then injected into the capsular bag. The remaining viscoelastic was aspirated.   Wounds were hydrated with balanced salt solution.  The anterior chamber was inflated to a physiologic pressure with balanced salt solution.  No wound leaks were noted. Vigamox was injected intracamerally.  Timolol and Brimonidine drops were applied to the eye.  The patient was taken to the recovery room in stable condition without complications of anesthesia or surgery.  Maryann Alar [place] 04/26/2022, 1:03 PM

## 2022-04-26 NOTE — Anesthesia Preprocedure Evaluation (Signed)
Anesthesia Evaluation  Patient identified by MRN, date of birth, ID band Patient awake    Reviewed: Allergy & Precautions, H&P , NPO status , Patient's Chart, lab work & pertinent test results, reviewed documented beta blocker date and time   Airway Mallampati: II  TM Distance: >3 FB Neck ROM: full    Dental no notable dental hx. (+) Teeth Intact   Pulmonary neg pulmonary ROS, former smoker,    Pulmonary exam normal breath sounds clear to auscultation       Cardiovascular Exercise Tolerance: Good hypertension, On Medications + CAD and + CABG   Rhythm:regular Rate:Normal     Neuro/Psych PSYCHIATRIC DISORDERS Depression negative neurological ROS     GI/Hepatic negative GI ROS, Neg liver ROS,   Endo/Other  negative endocrine ROSdiabetes, Well Controlled  Renal/GU      Musculoskeletal   Abdominal   Peds  Hematology negative hematology ROS (+)   Anesthesia Other Findings   Reproductive/Obstetrics negative OB ROS                             Anesthesia Physical Anesthesia Plan  ASA: 3  Anesthesia Plan: MAC   Post-op Pain Management:    Induction:   PONV Risk Score and Plan: 2  Airway Management Planned:   Additional Equipment:   Intra-op Plan:   Post-operative Plan:   Informed Consent: I have reviewed the patients History and Physical, chart, labs and discussed the procedure including the risks, benefits and alternatives for the proposed anesthesia with the patient or authorized representative who has indicated his/her understanding and acceptance.       Plan Discussed with: CRNA  Anesthesia Plan Comments:         Anesthesia Quick Evaluation

## 2022-04-27 ENCOUNTER — Encounter: Payer: Self-pay | Admitting: Ophthalmology

## 2022-04-30 NOTE — Anesthesia Postprocedure Evaluation (Signed)
Anesthesia Post Note  Patient: [name]  Procedure(s) Performed: CATARACT EXTRACTION PHACO AND INTRAOCULAR LENS PLACEMENT ([facility]) LEFT MALYUGIN OMIDIRA IRIS HOOKS (Left: Eye)  Patient location during evaluation: PACU Anesthesia Type: MAC Level of consciousness: awake and alert Pain management: pain level controlled Vital Signs Assessment: post-procedure vital signs reviewed and stable Respiratory status: spontaneous breathing, nonlabored ventilation, respiratory function stable and patient connected to nasal cannula oxygen Cardiovascular status: blood pressure returned to baseline and stable Postop Assessment: no apparent nausea or vomiting Anesthetic complications: no   No notable events documented.   Last Vitals:  Vitals:   04/26/22 1305 04/26/22 1308  BP: 125/76 123/72  Pulse: 64 62  Resp: 10 13  Temp: 36.9 C   SpO2: 93% 92%    Last Pain:  Vitals:   04/26/22 1308  TempSrc:   PainSc: 0-No pain                 Molli Barrows

## 2022-07-18 ENCOUNTER — Other Ambulatory Visit: Payer: Self-pay | Admitting: Family Medicine

## 2022-08-08 ENCOUNTER — Ambulatory Visit (INDEPENDENT_AMBULATORY_CARE_PROVIDER_SITE_OTHER): Payer: Medicare HMO | Admitting: Family Medicine

## 2022-08-08 ENCOUNTER — Ambulatory Visit (INDEPENDENT_AMBULATORY_CARE_PROVIDER_SITE_OTHER): Payer: Medicare HMO

## 2022-08-08 ENCOUNTER — Encounter: Payer: Self-pay | Admitting: Family Medicine

## 2022-08-08 VITALS — BP 130/72 | HR 61 | Temp 98.0°F | Ht 66.0 in | Wt 174.5 lb

## 2022-08-08 VITALS — Ht 65.98 in | Wt 170.0 lb

## 2022-08-08 DIAGNOSIS — E1165 Type 2 diabetes mellitus with hyperglycemia: Secondary | ICD-10-CM

## 2022-08-08 DIAGNOSIS — E785 Hyperlipidemia, unspecified: Secondary | ICD-10-CM | POA: Diagnosis not present

## 2022-08-08 DIAGNOSIS — Z125 Encounter for screening for malignant neoplasm of prostate: Secondary | ICD-10-CM

## 2022-08-08 DIAGNOSIS — Z0000 Encounter for general adult medical examination without abnormal findings: Secondary | ICD-10-CM

## 2022-08-08 DIAGNOSIS — Z79899 Other long term (current) drug therapy: Secondary | ICD-10-CM | POA: Diagnosis not present

## 2022-08-08 DIAGNOSIS — I25810 Atherosclerosis of coronary artery bypass graft(s) without angina pectoris: Secondary | ICD-10-CM | POA: Diagnosis not present

## 2022-08-08 DIAGNOSIS — R011 Cardiac murmur, unspecified: Secondary | ICD-10-CM

## 2022-08-08 DIAGNOSIS — Z23 Encounter for immunization: Secondary | ICD-10-CM | POA: Diagnosis not present

## 2022-08-08 LAB — POCT GLYCOSYLATED HEMOGLOBIN (HGB A1C): Hemoglobin A1C: 7.1 % — AB (ref 4.0–5.6)

## 2022-08-08 MED ORDER — ACCU-CHEK AVIVA PLUS VI STRP: ORAL_STRIP | 3 refills | Status: DC

## 2022-08-08 NOTE — Progress Notes (Signed)
Subjective:   [name] is a 76 y.o. male who presents for Medicare Annual/Subsequent preventive examination.  Review of Systems    No ROS.  Medicare Wellness Virtual Visit.  Visual/audio telehealth visit, UTA vital signs.   See social history for additional risk factors.   Cardiac Risk Factors include: advanced age (>50mn, >>23women);male gender;diabetes mellitus     Objective:    Today's Vitals   08/08/22 0957  Weight: 170 lb (77.1 kg)  Height: 5' 5.98" (1.676 m)   Body mass index is 27.46 kg/m.     08/08/2022    9:59 AM 04/26/2022   10:39 AM 04/12/2022    8:22 AM 08/02/2021    3:39 PM 07/27/2019    9:56 AM 07/22/2018   10:04 AM 07/05/2014   11:02 AM  Advanced Directives  Does Patient Have a Medical Advance Directive? No No No Yes Yes No Yes  Type of Advance Directive    [place]  Does patient want to make changes to medical advance directive?    Yes (MAU/Ambulatory/Procedural Areas - Information given)     [place] of [place]?     No - copy requested    Would patient like information on creating a medical advance directive? No - Patient declined No - Patient declined Yes (MAU/Ambulatory/Procedural Areas - Information given)   No - Patient declined     Current Medications (verified) Outpatient Encounter Medications as of 08/08/2022  Medication Sig   ACCU-CHEK SOFTCLIX LANCETS lancets Check blood sugar once daily and as instructed. Dx E11.65   amLODipine (NORVASC) 10 MG tablet TAKE 1 TABLET BY MOUTH EVERY DAY   aspirin 81 MG tablet Take 1 tablet (81 mg total) by mouth daily.   atorvastatin (LIPITOR) 80 MG tablet TAKE 1 TABLET(80 MG) BY MOUTH DAILY   Blood Glucose Monitoring Suppl (ACCU-CHEK AVIVA PLUS) w/Device KIT Check blood sugar once daily and as instructed. Dx E11.65   calcium carbonate (TUMS - DOSED IN MG ELEMENTAL CALCIUM) 500 MG chewable tablet  Chew 1 tablet by mouth daily as needed for indigestion or heartburn.   doxazosin (CARDURA) 1 MG tablet TAKE 1 TABLET(1 MG) BY MOUTH DAILY   fish oil-omega-3 fatty acids 1000 MG capsule Take 2 g by mouth daily.   glucose blood (ACCU-CHEK AVIVA PLUS) test strip Check blood sugar once daily and as instructed. Dx E11.65   losartan (COZAAR) 100 MG tablet TAKE 1 TABLET(100 MG) BY MOUTH DAILY   metFORMIN (GLUCOPHAGE-XR) 500 MG 24 hr tablet TAKE 4 TABLETS BY MOUTH EVERY DAY WITH BREAKFAST   metoprolol succinate (TOPROL-XL) 50 MG 24 hr tablet Take 1 tablet (50 mg total) by mouth daily. Take with or immediately following a meal.   Multiple Vitamin (MULTIVITAMIN) tablet Take 1 tablet by mouth daily.   No facility-administered encounter medications on file as of 08/08/2022.    Allergies (verified) Sulfonamide derivatives   History: Past Medical History:  Diagnosis Date   Alcohol abuse, unspecified    CAD (coronary artery disease) 04/20/2005   CABG x4   Colon polyps    Depression    [place] 2[place] 05/20/2002   HLD (hyperlipidemia) 05/20/2002   HTN (hypertension) 05/20/2002   Motion sickness    planes, deep sea fishing   Past Surgical History:  Procedure Laterality Date   CATARACT EXTRACTION W/PHACO Right 04/12/2022   Procedure: CATARACT EXTRACTION PHACO AND INTRAOCULAR  LENS PLACEMENT ([place]) RIGHT MALYUGIN OMIDRIA IRIS HOOKS;  Surgeon: Norvel Richards, MD;  Location: [facility];  Service: Ophthalmology;  Laterality: Right;  16.16 1:33.8   CATARACT EXTRACTION W/PHACO Left 04/26/2022   Procedure: CATARACT EXTRACTION PHACO AND INTRAOCULAR LENS PLACEMENT ([place]) LEFT MALYUGIN OMIDIRA IRIS HOOKS;  Surgeon: Norvel Richards, MD;  Location: [facility];  Service: Ophthalmology;  Laterality: Left;  14.02 2:13.8   COLONOSCOPY     CORONARY ARTERY BYPASS GRAFT  9/06   x4   ESOPHAGOGASTRODUODENOSCOPY  7/07   polyp; mucosal abnml   TONSILLECTOMY      Family History  Problem Relation Age of Onset   Alcohol abuse Father    Stroke Father    Other Father        cardiac problems   Heart disease Father    COPD Mother    Alcohol abuse Brother    Cirrhosis Brother        EtOH   Anxiety disorder Brother    Heart disease Paternal Grandmother    Colon cancer Neg Hx    Esophageal cancer Neg Hx    Rectal cancer Neg Hx    Stomach cancer Neg Hx    Social History   Socioeconomic History   Marital status: Significant Other    Spouse name: Not on file   Number of children: Not on file   Years of education: Not on file   Highest education level: Not on file  Occupational History   Not on file  Tobacco Use   Smoking status: Former   Smokeless tobacco: Never   Tobacco comments:    quit 41 years ago  Vaping Use   Vaping Use: Never used  Substance and Sexual Activity   Alcohol use: Yes    Alcohol/week: 17.0 standard drinks of alcohol    Types: 2 Glasses of wine, 15 Shots of liquor per week    Comment: 2 glasses of wine or 3-4 shots of bourbon daily   Drug use: No   Sexual activity: Not on file  Other Topics Concern   Not on file  Social History Narrative   Wife Eustaquio Maize is deceased   He is retired.    Enjoys firearms and recreational shooting.    Social Determinants of Health   Financial Resource Strain: Low Risk  (08/08/2022)   Overall Financial Resource Strain (CARDIA)    Difficulty of Paying Living Expenses: Not hard at all  Food Insecurity: No Food Insecurity (08/08/2022)   Hunger Vital Sign    Worried About Running Out of Food in the Last Year: Never true    Ran Out of Food in the Last Year: Never true  Transportation Needs: No Transportation Needs (08/08/2022)   PRAPARE - Hydrologist (Medical): No    Lack of Transportation (Non-Medical): No  Physical Activity: Sufficiently Active (08/08/2022)   Exercise Vital Sign    Days of Exercise per Week: 7 days    Minutes of Exercise per Session:  60 min  Stress: No Stress Concern Present (08/08/2022)   Why    Feeling of Stress : Not at all  Social Connections: [place] (08/08/2022)   Social Connection and Isolation Panel [NHANES]    Frequency of Communication with Friends and Family: Twice a week    Frequency of Social Gatherings with Friends and Family: More than three times a week    Attends Religious Services: More than  4 times per year    Active Member of Clubs or Organizations: Yes    Attends Music therapist: More than 4 times per year    Marital Status: Living with partner    Tobacco Counseling Counseling given: Not Answered Tobacco comments: quit 41 years ago   Clinical Intake:  Pre-visit preparation completed: Yes        Diabetes: Yes (Followed by PCP)  How often do you need to have someone help you when you read instructions, pamphlets, or other written materials from your doctor or pharmacy?: 1 - Never Nutrition Risk Assessment: Has the patient had any N/V/D within the last 2 months?  No  Does the patient have any non-healing wounds?  Notes he bit his tongue years ago and the area in still tender. The area is not worsening  Has the patient had any unintentional weight loss or weight gain?  No   Diabetes: Is the patient diabetic?  Yes  If diabetic, was a CBG obtained today?  Yes , FBS 189 Did the patient bring in their glucometer from home?  No  How often do you monitor your CBG's? daily.   Financial Strains and Diabetes Management: Are you having any financial strains with the device, your supplies or your medication? No .  Does the patient want to be seen by Chronic Care Management for management of their diabetes?  No  Would the patient like to be referred to a Nutritionist or for Diabetic Management?  No   Diabetic foot exam- due. Appointment in office with pcp today.  Labs followed by PCP. Interpreter  Needed?: No      Activities of Daily Living    08/08/2022   10:02 AM 04/26/2022   10:37 AM  In your present state of health, do you have any difficulty performing the following activities:  Hearing? 0 0  Vision? 0 0  Difficulty concentrating or making decisions? 0 0  Walking or climbing stairs? 0 0  Dressing or bathing? 0 0  Doing errands, shopping? 0   Preparing Food and eating ? N   Using the Toilet? N   In the past six months, have you accidently leaked urine? N   Do you have problems with loss of bowel control? N   Managing your Medications? N   Managing your Finances? N   Housekeeping or managing your Housekeeping? N     Patient Care Team: [name], MD as PCP - General Rockey Situ, Kathlene November, MD as PCP - Cardiology (Cardiology) Minna Merritts, MD as Consulting Physician (Cardiology)  Indicate any recent Medical Services you may have received from other than [facility] providers in the past year (date may be approximate).     Assessment:   This is a routine wellness examination for [facility].  I connected with  [name] on 08/08/22 by a audio enabled telemedicine application and verified that I am speaking with the correct person using two identifiers.  Patient Location: Home  Provider Location: Office/Clinic  I discussed the limitations of evaluation and management by telemedicine. The patient expressed understanding and agreed to proceed.   Hearing/Vision screen Hearing Screening - Comments:: Patient is able to hear conversational tones without difficulty.  No issues reported.   Vision Screening - Comments:: Followed by [facility] Cataract extraction, bilateral They have seen their ophthalmologist in the last 12 months.    Dietary issues and exercise activities discussed: Current Exercise Habits: Home exercise routine, Type of exercise: walking, Time (  Minutes): 60, Frequency (Times/Week): 7, Weekly Exercise (Minutes/Week): 420, Intensity:  Mild Low carb Good water intake   Goals Addressed               This Visit's Progress     Patient Stated     Weight (lb) < 170 lb (77.1 kg) (pt-stated)   170 lb (77.1 kg)     Portion control meals Stay active Weight goal 160-165lb       Depression Screen    08/08/2022   10:01 AM 08/02/2021    3:43 PM 07/27/2019    9:58 AM 07/21/2018   12:04 PM 06/13/2017   11:04 AM 06/06/2016   10:39 AM 05/30/2015   10:32 AM  PHQ 2/9 Scores  PHQ - 2 Score 0 0 2 0 0 0 0  PHQ- 9 Score   2 0       Fall Risk    08/08/2022   10:00 AM 08/02/2021    3:40 PM 08/04/2020   10:33 AM 07/27/2019    9:58 AM 07/21/2018   12:04 PM  [place] in the past year? 0 0 0 0 0  Number falls in past yr: 0 0  0   Injury with Fall? 0 0  0   Risk for fall due to : No Fall Risks No Fall Risks No Fall Risks Medication side effect   Follow up Falls evaluation completed;Falls prevention discussed Falls prevention discussed  Falls evaluation completed;Falls prevention discussed     FALL RISK PREVENTION PERTAINING TO THE HOME: Home free of loose throw rugs in walkways, pet beds, electrical cords, etc? Yes  Adequate lighting in your home to reduce risk of falls? Yes   ASSISTIVE DEVICES UTILIZED TO PREVENT FALLS: Life alert? No  Use of a cane, walker or w/c? No  Grab bars in the bathroom? No  Shower chair or bench in shower? No  Elevated toilet seat or a handicapped toilet? No   TIMED UP AND GO: Was the test performed? No .   Cognitive Function:    07/27/2019   10:04 AM 07/21/2018   12:32 PM  MMSE - Mini Mental State Exam  Orientation to time 5 5  Orientation to Place 5 5  Registration 3 3  Attention/ Calculation 3 0  Recall 3 1  Recall-comments  unable to recall 2 of 3 words  Language- name 2 objects  0  Language- repeat 1 1  Language- follow 3 step command  3  Language- read & follow direction  0  Write a sentence  0  Copy design  0  Total score  18        08/08/2022   10:18  AM  6CIT Screen  What Year? 0 points  What month? 0 points  What time? 0 points  Count back from 20 0 points  Months in reverse 0 points  Repeat phrase 0 points  Total Score 0 points    Immunizations Immunization History  Administered Date(s) Administered   Fluad Quad(high Dose 65+) 05/27/2020, 08/07/2021   Influenza Split 08/02/2011, 06/11/2012   Influenza Whole 08/10/2010   Influenza, High Dose Seasonal PF 05/18/2014   Influenza,inj,Quad PF,6+ Mos 05/25/2013, 05/30/2015, 06/13/2017   Influenza-Unspecified 05/16/2016   Moderna Sars-Covid-2 Vaccination 11/17/2019, 12/15/2019   Pneumococcal Conjugate-13 05/30/2015   Pneumococcal Polysaccharide-23 10/11/2011   Td 08/20/1993, 04/12/2009   TDAP status: Due, Education has been provided regarding the importance of this vaccine. Advised may receive this vaccine at local pharmacy  or Health Dept. Aware to provide a copy of the vaccination record if obtained from local pharmacy or Health Dept. Verbalized acceptance and understanding.  Flu Vaccine status: Due, Education has been provided regarding the importance of this vaccine. Advised may receive this vaccine at local pharmacy or Health Dept. Aware to provide a copy of the vaccination record if obtained from local pharmacy or Health Dept. Verbalized acceptance and understanding.  Covid-19 vaccine status: Completed vaccines x2.  Shingrix Completed?: No.    Education has been provided regarding the importance of this vaccine. Patient has been advised to call insurance company to determine out of pocket expense if they have not yet received this vaccine. Advised may also receive vaccine at local pharmacy or Health Dept. Verbalized acceptance and understanding.  Screening Tests Health Maintenance  Topic Date Due   FOOT EXAM  05/30/2016   DTaP/Tdap/Td (3 - Tdap) 04/13/2019   Diabetic kidney evaluation - Urine ACR  08/01/2021   INFLUENZA VACCINE  03/20/2022   Diabetic kidney evaluation -  eGFR measurement  08/04/2022   HEMOGLOBIN A1C  08/07/2022   COVID-19 Vaccine (3 - Moderna risk series) 08/24/2022 (Originally 01/12/2020)   Zoster Vaccines- Shingrix (1 of 2) 11/07/2022 (Originally 05/20/1965)   OPHTHALMOLOGY EXAM  02/08/2023   Medicare Annual Wellness (AWV)  08/09/2023   Pneumonia Vaccine 67+ Years old  Completed   Hepatitis C Screening  Completed   HPV VACCINES  Aged Out   Fecal DNA (Cologuard)  Discontinued   Health Maintenance Health Maintenance Due  Topic Date Due   FOOT EXAM  05/30/2016   DTaP/Tdap/Td (3 - Tdap) 04/13/2019   Diabetic kidney evaluation - Urine ACR  08/01/2021   INFLUENZA VACCINE  03/20/2022   Diabetic kidney evaluation - eGFR measurement  08/04/2022   HEMOGLOBIN A1C  08/07/2022   Lung Cancer Screening: (Low Dose CT Chest recommended if Age 51-80 years, 30 pack-year currently smoking OR have quit w/in 15years.) does not qualify.   Hepatitis C Screening: Completed 05/2016.  Vision Screening: Recommended annual ophthalmology exams for early detection of glaucoma and other disorders of the eye.  Dental Screening: Recommended annual dental exams for proper oral hygiene.  Community Resource Referral / Chronic Care Management: CRR required this visit?  No   CCM required this visit?  No      Plan:     I have personally reviewed and noted the following in the patient's chart:   Medical and social history Use of alcohol, tobacco or illicit drugs  Current medications and supplements including opioid prescriptions. Patient is not currently taking opioid prescriptions. Functional ability and status Nutritional status Physical activity Advanced directives List of other physicians Hospitalizations, surgeries, and ER visits in previous 12 months Vitals Screenings to include cognitive, depression, and falls Referrals and appointments  In addition, I have reviewed and discussed with patient certain preventive protocols, quality metrics, and  best practice recommendations. A written personalized care plan for preventive services as well as general preventive health recommendations were provided to patient.     Leta Jungling, LPN   [id number]

## 2022-08-08 NOTE — Patient Instructions (Addendum)
[name] , Thank you for taking time to come for your Medicare Wellness Visit. I appreciate your ongoing commitment to your health goals. Please review the following plan we discussed and let me know if I can assist you in the future.   These are the goals we discussed:    Goals Addressed               This Visit's Progress     Patient Stated     Weight (lb) < 170 lb (77.1 kg) (pt-stated)   170 lb (77.1 kg)     Portion control meals Stay active Weight goal 160-165lb       This is a list of the screening recommended for you and due dates:  Health Maintenance  Topic Date Due   Complete foot exam   05/30/2016   DTaP/Tdap/Td vaccine (3 - Tdap) 04/13/2019   Yearly kidney health urinalysis for diabetes  08/01/2021   Flu Shot  03/20/2022   Yearly kidney function blood test for diabetes  08/04/2022   Hemoglobin A1C  08/07/2022   COVID-19 Vaccine (3 - Moderna risk series) 08/24/2022*   Zoster (Shingles) Vaccine (1 of 2) 11/07/2022*   Eye exam for diabetics  02/08/2023   Medicare Annual Wellness Visit  08/09/2023   Pneumonia Vaccine  Completed   Hepatitis C Screening: USPSTF Recommendation to screen - Ages 18-79 yo.  Completed   HPV Vaccine  Aged Out   Cologuard (Stool DNA test)  Discontinued  *Topic was postponed. The date shown is not the original due date.    Advanced directives: End of life planning; Advance aging; Advanced directives discussed.  Copy of current HCPOA/Living Will requested upon completion.    Conditions/risks identified: none new.  Next appointment: Follow up in one year for your annual wellness visit.   Preventive Care 76 Years and Older, Male  Preventive care refers to lifestyle choices and visits with your health care provider that can promote health and wellness. What does preventive care include? A yearly physical exam. This is also called an annual well check. Dental exams once or twice a year. Routine eye exams. Ask your health care provider  how often you should have your eyes checked. Personal lifestyle choices, including: Daily care of your teeth and gums. Regular physical activity. Eating a healthy diet. Avoiding tobacco and drug use. Limiting alcohol use. Practicing safe sex. Taking low doses of aspirin every day. Taking vitamin and mineral supplements as recommended by your health care provider. What happens during an annual well check? The services and screenings done by your health care provider during your annual well check will depend on your age, overall health, lifestyle risk factors, and family history of disease. Counseling  Your health care provider may ask you questions about your: Alcohol use. Tobacco use. Drug use. Emotional well-being. Home and relationship well-being. Sexual activity. Eating habits. History of falls. Memory and ability to understand (cognition). Work and work Statistician. Screening  You may have the following tests or measurements: Height, weight, and BMI. Blood pressure. Lipid and cholesterol levels. These may be checked every 5 years, or more frequently if you are over 65 years old. Skin check. Lung cancer screening. You may have this screening every year starting at age 37 if you have a 30-pack-year history of smoking and currently smoke or have quit within the past 15 years. Fecal occult blood test (FOBT) of the stool. You may have this test every year starting at age 58. Flexible sigmoidoscopy  or colonoscopy. You may have a sigmoidoscopy every 5 years or a colonoscopy every 10 years starting at age 60. Prostate cancer screening. Recommendations will vary depending on your family history and other risks. Hepatitis C blood test. Hepatitis B blood test. Sexually transmitted disease (STD) testing. Diabetes screening. This is done by checking your blood sugar (glucose) after you have not eaten for a while (fasting). You may have this done every 1-3 years. Abdominal aortic aneurysm  (AAA) screening. You may need this if you are a current or former smoker. Osteoporosis. You may be screened starting at age 76 if you are at high risk. Talk with your health care provider about your test results, treatment options, and if necessary, the need for more tests. Vaccines  Your health care provider may recommend certain vaccines, such as: Influenza vaccine. This is recommended every year. Tetanus, diphtheria, and acellular pertussis (Tdap, Td) vaccine. You may need a Td booster every 10 years. Zoster vaccine. You may need this after age 28. Pneumococcal 13-valent conjugate (PCV13) vaccine. One dose is recommended after age 63. Pneumococcal polysaccharide (PPSV23) vaccine. One dose is recommended after age 61. Talk to your health care provider about which screenings and vaccines you need and how often you need them. This information is not intended to replace advice given to you by your health care provider. Make sure you discuss any questions you have with your health care provider. Document Released: 09/02/2015 Document Revised: 04/25/2016 Document Reviewed: 06/07/2015 Elsevier Interactive Patient Education  2017 [employer] Prevention in the Home Falls can cause injuries. They can happen to people of all ages. There are many things you can do to make your home safe and to help prevent falls. What can I do on the outside of my home? Regularly fix the edges of walkways and driveways and fix any cracks. Remove anything that might make you trip as you walk through a door, such as a raised step or threshold. Trim any bushes or trees on the path to your home. Use bright outdoor lighting. Clear any walking paths of anything that might make someone trip, such as rocks or tools. Regularly check to see if handrails are loose or broken. Make sure that both sides of any steps have handrails. Any raised decks and porches should have guardrails on the edges. Have any leaves, snow, or  ice cleared regularly. Use sand or salt on walking paths during winter. Clean up any spills in your garage right away. This includes oil or grease spills. What can I do in the bathroom? Use night lights. Install grab bars by the toilet and in the tub and shower. Do not use towel bars as grab bars. Use non-skid mats or decals in the tub or shower. If you need to sit down in the shower, use a plastic, non-slip stool. Keep the floor dry. Clean up any water that spills on the floor as soon as it happens. Remove soap buildup in the tub or shower regularly. Attach bath mats securely with double-sided non-slip rug tape. Do not have throw rugs and other things on the floor that can make you trip. What can I do in the bedroom? Use night lights. Make sure that you have a light by your bed that is easy to reach. Do not use any sheets or blankets that are too big for your bed. They should not hang down onto the floor. Have a firm chair that has side arms. You can use this for support  while you get dressed. Do not have throw rugs and other things on the floor that can make you trip. What can I do in the kitchen? Clean up any spills right away. Avoid walking on wet floors. Keep items that you use a lot in easy-to-reach places. If you need to reach something above you, use a strong step stool that has a grab bar. Keep electrical cords out of the way. Do not use floor polish or wax that makes floors slippery. If you must use wax, use non-skid floor wax. Do not have throw rugs and other things on the floor that can make you trip. What can I do with my stairs? Do not leave any items on the stairs. Make sure that there are handrails on both sides of the stairs and use them. Fix handrails that are broken or loose. Make sure that handrails are as long as the stairways. Check any carpeting to make sure that it is firmly attached to the stairs. Fix any carpet that is loose or worn. Avoid having throw rugs at  the top or bottom of the stairs. If you do have throw rugs, attach them to the floor with carpet tape. Make sure that you have a light switch at the top of the stairs and the bottom of the stairs. If you do not have them, ask someone to add them for you. What else can I do to help prevent falls? Wear shoes that: Do not have high heels. Have rubber bottoms. Are comfortable and fit you well. Are closed at the toe. Do not wear sandals. If you use a stepladder: Make sure that it is fully opened. Do not climb a closed stepladder. Make sure that both sides of the stepladder are locked into place. Ask someone to hold it for you, if possible. Clearly mark and make sure that you can see: Any grab bars or handrails. First and last steps. Where the edge of each step is. Use tools that help you move around (mobility aids) if they are needed. These include: Canes. Walkers. Scooters. Crutches. Turn on the lights when you go into a dark area. Replace any light bulbs as soon as they burn out. Set up your furniture so you have a clear path. Avoid moving your furniture around. If any of your floors are uneven, fix them. If there are any pets around you, be aware of where they are. Review your medicines with your doctor. Some medicines can make you feel dizzy. This can increase your chance of falling. Ask your doctor what other things that you can do to help prevent falls. This information is not intended to replace advice given to you by your health care provider. Make sure you discuss any questions you have with your health care provider. Document Released: 06/02/2009 Document Revised: 01/12/2016 Document Reviewed: 09/10/2014 Elsevier Interactive Patient Education  2017 [employer].

## 2022-08-08 NOTE — Progress Notes (Unsigned)
Gregory Scales T. Millie Shorb, MD, [place] at [facility] [place], [address]  Phone: [phone]  FAX: [phone]  [name] - 76 y.o. male  MRN [id number]  Date of Birth: 15-Sep-1945  Date: 08/08/2022  PCP: [name], MD  Referral: [name], MD  Chief Complaint  Patient presents with   Diabetes   Subjective:   Gregory WILLMON is a 76 y.o. very pleasant male patient with Body mass index is 28.17 kg/m. who presents with the following:  F/u medical problems after medicare wellness exam.   Shingrix - thinks he had it as a child Tdap -will think about getting this at the pharmacy RSV -he will likely avoid  2 - 12 oz wines each day, 24 oz of wine each day - seldom Tequila - No tobacco  Has cut back on his fries  Diabetes Mellitus: Tolerating Medications: yes Compliance with diet: fair, Body mass index is 28.17 kg/m. Exercise: minimal / intermittent Avg blood sugars at home: not checking Foot problems: none Hypoglycemia: none No nausea, vomitting, blurred vision, polyuria.  Lab Results  Component Value Date   HGBA1C 7.1 (A) 08/08/2022   HGBA1C 7.7 (A) 02/05/2022   HGBA1C 7.3 (H) 08/04/2021   Lab Results  Component Value Date   MICROALBUR 1.4 08/01/2020   LDLCALC 48 08/04/2021   CREATININE 1.29 08/04/2021    Wt Readings from Last 3 Encounters:  08/08/22 174 lb 8 oz (79.2 kg)  08/08/22 170 lb (77.1 kg)  04/26/22 170 lb 4.8 oz (77.2 kg)    HTN: Tolerating all medications without side effects Stable and at goal No CP, no sob. No HA.  BP Readings from Last 3 Encounters:  08/08/22 130/72  04/26/22 123/72  04/12/22 (!) 748/27    Basic Metabolic Panel:    Component Value Date/Time   NA 140 08/04/2021 1406   K 4.7 08/04/2021 1406   CL 104 08/04/2021 1406   CO2 27 08/04/2021 1406   BUN 19 08/04/2021 1406   CREATININE 1.29 08/04/2021 1406   GLUCOSE 129 (H) 08/04/2021 1406    CALCIUM 10.2 08/04/2021 1406     Health Maintenance  Topic Date Due   FOOT EXAM  05/30/2016   DTaP/Tdap/Td (3 - Tdap) 04/13/2019   Diabetic kidney evaluation - Urine ACR  08/01/2021   Diabetic kidney evaluation - eGFR measurement  08/04/2022   COVID-19 Vaccine (3 - Moderna risk series) 08/24/2022 (Originally 01/12/2020)   Zoster Vaccines- Shingrix (1 of 2) 11/07/2022 (Originally 05/20/1965)   HEMOGLOBIN A1C  02/07/2023   OPHTHALMOLOGY EXAM  02/08/2023   Medicare Annual Wellness (AWV)  08/09/2023   Pneumonia Vaccine 63+ Years old  Completed   INFLUENZA VACCINE  Completed   Hepatitis C Screening  Completed   HPV VACCINES  Aged Out   Fecal DNA (Cologuard)  Discontinued    Immunization History  Administered Date(s) Administered   Fluad Quad(high Dose 65+) 05/27/2020, 08/07/2021, 08/08/2022   Influenza Split 08/02/2011, 06/11/2012   Influenza Whole 08/10/2010   Influenza, High Dose Seasonal PF 05/18/2014   Influenza,inj,Quad PF,6+ Mos 05/25/2013, 05/30/2015, 06/13/2017   Influenza-Unspecified 05/16/2016   Moderna Sars-Covid-2 Vaccination 11/17/2019, 12/15/2019   Pneumococcal Conjugate-13 05/30/2015   Pneumococcal Polysaccharide-23 10/11/2011   Td 08/20/1993, 04/12/2009     Review of Systems is noted in the HPI, as appropriate  Objective:   BP 130/72   Pulse 61   Temp 98 F (36.7 C) (Oral)   Ht 5'  6" (1.676 m)   Wt 174 lb 8 oz (79.2 kg)   SpO2 96%   BMI 28.17 kg/m   GEN: No acute distress; alert,appropriate. PULM: Breathing comfortably in no respiratory distress PSYCH: Normally interactive.  CV: 3/6 SEM  PULM: Normal respiratory rate, no accessory muscle use. No wheezes, crackles or rhonchi   Laboratory and Imaging Data: Results for orders placed or performed in visit on 08/08/22  POCT glycosylated hemoglobin (Hb A1C)  Result Value Ref Range   Hemoglobin A1C 7.1 (A) 4.0 - 5.6 %   HbA1c POC (<> result, manual entry)     HbA1c, POC (prediabetic range)     HbA1c, POC  (controlled diabetic range)       Assessment and Plan:     ICD-10-CM   1. Uncontrolled type 2 diabetes mellitus with hyperglycemia (HCC)  E11.65 POCT glycosylated hemoglobin (Hb A1C)    Basic metabolic panel    Microalbumin / creatinine urine ratio    2. Need for influenza vaccination  Z23 Flu Vaccine QUAD High Dose(Fluad)    3. 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 Heart murmur  R01.1 ECHOCARDIOGRAM COMPLETE    5. Hyperlipidemia LDL goal <70  E78.5 Lipid panel    6. Screening for prostate cancer  Z12.5 PSA, Medicare    7. Encounter for long-term (current) use of medications  Z79.899 CBC with Differential/Platelet    Hepatic function panel     Diabetes is stable, continue current medications.  He has a loud systolic ejection murmur, and I think this is louder than I have ever heard of before per my recollection.  He has never had an echocardiogram, so I will order 1 right now.  Will think stock of his labs including his cholesterol panel.  He does have a history of coronary disease status post CABG.  Medication Management during today's office visit: Meds ordered this encounter  Medications   glucose blood (ACCU-CHEK AVIVA PLUS) test strip    Sig: Check blood sugar once daily and as instructed. Dx E11.65    Dispense:  100 each    Refill:  3   Medications Discontinued During This Encounter  Medication Reason   glucose blood (ACCU-CHEK AVIVA PLUS) test strip Reorder    Orders placed today for conditions managed today: Orders Placed This Encounter  Procedures   Flu Vaccine QUAD High Dose(Fluad)   Basic metabolic panel   CBC with Differential/Platelet   Hepatic function panel   Microalbumin / creatinine urine ratio   PSA, Medicare   Lipid panel   POCT glycosylated hemoglobin (Hb A1C)   ECHOCARDIOGRAM COMPLETE    Disposition: Return in about 6 months (around 02/07/2023)  for diabetes follow-up.  [employer] One speech-to-text software was used for transcription in this dictation.  Possible transcriptional errors can occur using Editor, commissioning.   Signed,  Maud Deed. Koula Venier, MD   Outpatient Encounter Medications as of 08/08/2022  Medication Sig   ACCU-CHEK SOFTCLIX LANCETS lancets Check blood sugar once daily and as instructed. Dx E11.65   amLODipine (NORVASC) 10 MG tablet TAKE 1 TABLET BY MOUTH EVERY DAY   aspirin 81 MG tablet Take 1 tablet (81 mg total) by mouth daily.   atorvastatin (LIPITOR) 80 MG tablet TAKE 1 TABLET(80 MG) BY MOUTH DAILY   Blood Glucose Monitoring Suppl (ACCU-CHEK AVIVA PLUS) w/Device KIT Check blood sugar once daily and as instructed. Dx E11.65   calcium carbonate (  TUMS - DOSED IN MG ELEMENTAL CALCIUM) 500 MG chewable tablet Chew 1 tablet by mouth daily as needed for indigestion or heartburn.   doxazosin (CARDURA) 1 MG tablet TAKE 1 TABLET(1 MG) BY MOUTH DAILY   fish oil-omega-3 fatty acids 1000 MG capsule Take 2 g by mouth daily.   losartan (COZAAR) 100 MG tablet TAKE 1 TABLET(100 MG) BY MOUTH DAILY   metFORMIN (GLUCOPHAGE-XR) 500 MG 24 hr tablet TAKE 4 TABLETS BY MOUTH EVERY DAY WITH BREAKFAST   metoprolol succinate (TOPROL-XL) 50 MG 24 hr tablet Take 1 tablet (50 mg total) by mouth daily. Take with or immediately following a meal.   Multiple Vitamin (MULTIVITAMIN) tablet Take 1 tablet by mouth daily.   [DISCONTINUED] glucose blood (ACCU-CHEK AVIVA PLUS) test strip Check blood sugar once daily and as instructed. Dx E11.65   glucose blood (ACCU-CHEK AVIVA PLUS) test strip Check blood sugar once daily and as instructed. Dx E11.65   No facility-administered encounter medications on file as of 08/08/2022.

## 2022-08-14 ENCOUNTER — Other Ambulatory Visit (INDEPENDENT_AMBULATORY_CARE_PROVIDER_SITE_OTHER): Payer: Medicare HMO

## 2022-08-14 DIAGNOSIS — E1165 Type 2 diabetes mellitus with hyperglycemia: Secondary | ICD-10-CM | POA: Diagnosis not present

## 2022-08-14 DIAGNOSIS — Z125 Encounter for screening for malignant neoplasm of prostate: Secondary | ICD-10-CM | POA: Diagnosis not present

## 2022-08-14 DIAGNOSIS — E785 Hyperlipidemia, unspecified: Secondary | ICD-10-CM | POA: Diagnosis not present

## 2022-08-14 DIAGNOSIS — Z79899 Other long term (current) drug therapy: Secondary | ICD-10-CM

## 2022-08-14 LAB — CBC WITH DIFFERENTIAL/PLATELET
Basophils Absolute: 0.1 10*3/uL (ref 0.0–0.1)
Basophils Relative: 1.1 % (ref 0.0–3.0)
Eosinophils Absolute: 0.3 10*3/uL (ref 0.0–0.7)
Eosinophils Relative: 5.6 % — ABNORMAL HIGH (ref 0.0–5.0)
HCT: 42.5 % (ref 39.0–52.0)
Hemoglobin: 14.6 g/dL (ref 13.0–17.0)
Lymphocytes Relative: 29.2 % (ref 12.0–46.0)
Lymphs Abs: 1.4 10*3/uL (ref 0.7–4.0)
MCHC: 34.5 g/dL (ref 30.0–36.0)
MCV: 92.6 fl (ref 78.0–100.0)
Monocytes Absolute: 0.5 10*3/uL (ref 0.1–1.0)
Monocytes Relative: 9.7 % (ref 3.0–12.0)
Neutro Abs: 2.6 10*3/uL (ref 1.4–7.7)
Neutrophils Relative %: 54.4 % (ref 43.0–77.0)
Platelets: 179 10*3/uL (ref 150.0–400.0)
RBC: 4.59 Mil/uL (ref 4.22–5.81)
RDW: 12.6 % (ref 11.5–15.5)
WBC: 4.9 10*3/uL (ref 4.0–10.5)

## 2022-08-14 LAB — MICROALBUMIN / CREATININE URINE RATIO
Creatinine,U: 53.1 mg/dL
Microalb Creat Ratio: 1.3 mg/g (ref 0.0–30.0)
Microalb, Ur: <0.7 mg/dL (ref 0.0–1.9)

## 2022-08-14 LAB — BASIC METABOLIC PANEL
BUN: 18 mg/dL (ref 6–23)
CO2: 28 mEq/L (ref 19–32)
Calcium: 10.3 mg/dL (ref 8.4–10.5)
Chloride: 102 mEq/L (ref 96–112)
Creatinine, Ser: 1.28 mg/dL (ref 0.40–1.50)
GFR: 54.47 mL/min — ABNORMAL LOW (ref >60.00)
Glucose, Bld: 151 mg/dL — ABNORMAL HIGH (ref 70–99)
Potassium: 4.8 mEq/L (ref 3.5–5.1)
Sodium: 140 mEq/L (ref 135–145)

## 2022-08-14 LAB — HEPATIC FUNCTION PANEL
ALT: 25 U/L (ref 0–53)
AST: 19 U/L (ref 0–37)
Albumin: 4.6 g/dL (ref 3.5–5.2)
Alkaline Phosphatase: 37 U/L — ABNORMAL LOW (ref 39–117)
Bilirubin, Direct: 0.1 mg/dL (ref 0.0–0.3)
Total Bilirubin: 0.8 mg/dL (ref 0.2–1.2)
Total Protein: 7.2 g/dL (ref 6.0–8.3)

## 2022-08-14 LAB — LDL CHOLESTEROL, DIRECT: Direct LDL: 52 mg/dL

## 2022-08-14 LAB — LIPID PANEL
Cholesterol: 118 mg/dL (ref 0–200)
HDL: 29.6 mg/dL — ABNORMAL LOW (ref >39.00)
NonHDL: 88.52
Total CHOL/HDL Ratio: 4
Triglycerides: 220 mg/dL — ABNORMAL HIGH (ref 0.0–149.0)
VLDL: 44 mg/dL — ABNORMAL HIGH (ref 0.0–40.0)

## 2022-08-14 LAB — PSA, MEDICARE: PSA: 2 ng/ml (ref 0.10–4.00)

## 2022-08-21 ENCOUNTER — Telehealth: Payer: Self-pay | Admitting: Family Medicine

## 2022-08-21 NOTE — Telephone Encounter (Signed)
[facility] called stating they need a authorization for a echo the pt has scheduled for tomorrow, 08/22/22 @ 9:30 AM. Office stated authorization can be added to pt's chart. Call back, [phone]

## 2022-08-22 ENCOUNTER — Ambulatory Visit: Payer: Medicare HMO | Attending: Family Medicine

## 2022-08-22 DIAGNOSIS — R011 Cardiac murmur, unspecified: Secondary | ICD-10-CM

## 2022-08-22 DIAGNOSIS — I25810 Atherosclerosis of coronary artery bypass graft(s) without angina pectoris: Secondary | ICD-10-CM

## 2022-08-22 LAB — ECHOCARDIOGRAM COMPLETE
AR max vel: 1.75 cm2
AV Area VTI: 1.81 cm2
AV Area mean vel: 1.72 cm2
AV Mean grad: 11 mmHg
AV Peak grad: 16.2 mmHg
Ao pk vel: 2.01 m/s
Area-P 1/2: 3.89 cm2
Calc EF: 60.4 %
S' Lateral: 2.6 cm
Single Plane A2C EF: 54.9 %
Single Plane A4C EF: 64.6 %

## 2022-08-22 NOTE — Telephone Encounter (Signed)
No auth is needed.

## 2022-09-03 ENCOUNTER — Other Ambulatory Visit: Payer: Self-pay | Admitting: Cardiovascular Disease

## 2022-09-15 ENCOUNTER — Other Ambulatory Visit: Payer: Self-pay | Admitting: Family Medicine

## 2022-10-01 ENCOUNTER — Other Ambulatory Visit: Payer: Self-pay | Admitting: Family Medicine

## 2022-12-18 ENCOUNTER — Encounter: Payer: Self-pay | Admitting: Nurse Practitioner

## 2022-12-18 ENCOUNTER — Ambulatory Visit (INDEPENDENT_AMBULATORY_CARE_PROVIDER_SITE_OTHER): Payer: Medicare HMO | Admitting: Nurse Practitioner

## 2022-12-18 VITALS — BP 128/76 | HR 68 | Temp 98.1°F | Ht 66.0 in | Wt 177.6 lb

## 2022-12-18 DIAGNOSIS — R350 Frequency of micturition: Secondary | ICD-10-CM

## 2022-12-18 LAB — POC URINALSYSI DIPSTICK (AUTOMATED)
Bilirubin, UA: NEGATIVE
Blood, UA: NEGATIVE
Glucose, UA: NEGATIVE
Ketones, UA: NEGATIVE
Leukocytes, UA: NEGATIVE
Nitrite, UA: NEGATIVE
Protein, UA: NEGATIVE
Spec Grav, UA: 1.01 (ref 1.010–1.025)
Urobilinogen, UA: 0.2 E.U./dL
pH, UA: 5.5 (ref 5.0–8.0)

## 2022-12-18 NOTE — Progress Notes (Signed)
Established Patient Office Visit  Subjective:  Patient ID: [name], male    DOB: 1946/02/16  Age: 77 y.o. MRN: [id number]  CC:  Chief Complaint  Patient presents with   Urinary Tract Infection    Mild burning when urinating, itching & pain by kidneys on both sides of back since 12/13/22  Patient has been taking Cranberry tablets but not getting better    HPI  [name] presents for dysuria, pruritus and back pain from last 5 days.  He also reports increased frequency.  Denies fever or  abdominal pain   Patient states that he has increased his fluid intake and is also taking cranberry tablets without significant improvement.   HPI   Past Medical History:  Diagnosis Date   Alcohol abuse, unspecified    CAD (coronary artery disease) 04/20/2005   CABG x4   Colon polyps    Depression    DMII (diabetes mellitus, type 2) (HCC) 05/20/2002   HLD (hyperlipidemia) 05/20/2002   HTN (hypertension) 05/20/2002   Motion sickness    planes, deep sea fishing    Past Surgical History:  Procedure Laterality Date   CATARACT EXTRACTION W/PHACO Right 04/12/2022   Procedure: CATARACT EXTRACTION PHACO AND INTRAOCULAR LENS PLACEMENT (IOC) RIGHT MALYUGIN OMIDRIA IRIS HOOKS;  Surgeon: Estanislado Pandy, MD;  Location: [facility];  Service: Ophthalmology;  Laterality: Right;  16.16 1:33.8   CATARACT EXTRACTION W/PHACO Left 04/26/2022   Procedure: CATARACT EXTRACTION PHACO AND INTRAOCULAR LENS PLACEMENT (IOC) LEFT MALYUGIN OMIDIRA IRIS HOOKS;  Surgeon: Estanislado Pandy, MD;  Location: [facility];  Service: Ophthalmology;  Laterality: Left;  14.02 2:13.8   COLONOSCOPY     CORONARY ARTERY BYPASS GRAFT  9/06   x4   ESOPHAGOGASTRODUODENOSCOPY  7/07   polyp; mucosal abnml   TONSILLECTOMY      Family History  Problem Relation Age of Onset   Alcohol abuse Father    Stroke Father    Other Father        cardiac problems   Heart disease Father    COPD Mother     Alcohol abuse Brother    Cirrhosis Brother        EtOH   Anxiety disorder Brother    Heart disease Paternal Grandmother    Colon cancer Neg Hx    Esophageal cancer Neg Hx    Rectal cancer Neg Hx    Stomach cancer Neg Hx     Social History   Socioeconomic History   Marital status: Significant Other    Spouse name: Not on file   Number of children: Not on file   Years of education: Not on file   Highest education level: Not on file  Occupational History   Not on file  Tobacco Use   Smoking status: Former   Smokeless tobacco: Never   Tobacco comments:    quit 41 years ago  Vaping Use   Vaping Use: Never used  Substance and Sexual Activity   Alcohol use: Yes    Alcohol/week: 17.0 standard drinks of alcohol    Types: 2 Glasses of wine, 15 Shots of liquor per week    Comment: 2 glasses of wine or 3-4 shots of bourbon daily   Drug use: No   Sexual activity: Not on file  Other Topics Concern   Not on file  Social History Narrative   Wife Waynetta Sandy is deceased   He is retired.    Enjoys firearms and recreational shooting.  Social Determinants of Health   Financial Resource Strain: Low Risk  (08/08/2022)   Overall Financial Resource Strain (CARDIA)    Difficulty of Paying Living Expenses: Not hard at all  Food Insecurity: No Food Insecurity (08/08/2022)   Hunger Vital Sign    Worried About Running Out of Food in the Last Year: Never true    Ran Out of Food in the Last Year: Never true  Transportation Needs: No Transportation Needs (08/08/2022)   PRAPARE - Administrator, Civil Service (Medical): No    Lack of Transportation (Non-Medical): No  Physical Activity: Sufficiently Active (08/08/2022)   Exercise Vital Sign    Days of Exercise per Week: 7 days    Minutes of Exercise per Session: 60 min  Stress: No Stress Concern Present (08/08/2022)   [employer] of Occupational Health - Occupational Stress Questionnaire    Feeling of Stress : Not at all   Social Connections: Socially Integrated (08/08/2022)   Social Connection and Isolation Panel [NHANES]    Frequency of Communication with Friends and Family: Twice a week    Frequency of Social Gatherings with Friends and Family: More than three times a week    Attends Religious Services: More than 4 times per year    Active Member of [employer] or Organizations: Yes    Attends Banker Meetings: More than 4 times per year    Marital Status: Living with partner  Intimate Partner Violence: Not At Risk (08/08/2022)   Humiliation, Afraid, Rape, and Kick questionnaire    Fear of Current or Ex-Partner: No    Emotionally Abused: No    Physically Abused: No    Sexually Abused: No     Outpatient Medications Prior to Visit  Medication Sig Dispense Refill   ACCU-CHEK SOFTCLIX LANCETS lancets Check blood sugar once daily and as instructed. Dx E11.65 100 each 3   amLODipine (NORVASC) 10 MG tablet TAKE 1 TABLET BY MOUTH EVERY DAY 90 tablet 1   aspirin 81 MG tablet Take 1 tablet (81 mg total) by mouth daily.     atorvastatin (LIPITOR) 80 MG tablet TAKE 1 TABLET(80 MG) BY MOUTH DAILY 90 tablet 3   Blood Glucose Monitoring Suppl (ACCU-CHEK AVIVA PLUS) w/Device KIT Check blood sugar once daily and as instructed. Dx E11.65 1 kit 0   calcium carbonate (TUMS - DOSED IN MG ELEMENTAL CALCIUM) 500 MG chewable tablet Chew 1 tablet by mouth daily as needed for indigestion or heartburn.     doxazosin (CARDURA) 1 MG tablet TAKE 1 TABLET(1 MG) BY MOUTH DAILY 30 tablet 10   fish oil-omega-3 fatty acids 1000 MG capsule Take 2 g by mouth daily.     glucose blood (ACCU-CHEK AVIVA PLUS) test strip Check blood sugar once daily and as instructed. Dx E11.65 100 each 3   losartan (COZAAR) 100 MG tablet TAKE 1 TABLET(100 MG) BY MOUTH DAILY 90 tablet 1   metFORMIN (GLUCOPHAGE-XR) 500 MG 24 hr tablet TAKE 4 TABLETS BY MOUTH EVERY DAY WITH BREAKFAST 360 tablet 1   metoprolol succinate (TOPROL-XL) 50 MG 24 hr tablet  Take 1 tablet (50 mg total) by mouth daily. Take with or immediately following a meal. 90 tablet 3   Multiple Vitamin (MULTIVITAMIN) tablet Take 1 tablet by mouth daily.     No facility-administered medications prior to visit.    Allergies  Allergen Reactions   Sulfonamide Derivatives     REACTION: unknown reaction    ROS Review of Systems  Constitutional: Negative.   Respiratory: Negative.    Cardiovascular: Negative.   Genitourinary:  Positive for dysuria and flank pain.  Musculoskeletal:        Back pain  Skin: Negative.   Neurological: Negative.   Psychiatric/Behavioral: Negative.        Objective:    Physical Exam Constitutional:      Appearance: Normal appearance. He is normal weight.  HENT:     Head: Normocephalic.     Mouth/Throat:     Mouth: Mucous membranes are moist.  Cardiovascular:     Rate and Rhythm: Normal rate and regular rhythm.     Pulses: Normal pulses.     Heart sounds: Normal heart sounds. No murmur heard. Pulmonary:     Effort: Pulmonary effort is normal.     Breath sounds: Normal breath sounds. No wheezing.  Abdominal:     General: Bowel sounds are normal.     Palpations: Abdomen is soft.     Tenderness: There is no abdominal tenderness. There is no right CVA tenderness or left CVA tenderness.  Musculoskeletal:        General: Normal range of motion.  Skin:    General: Skin is warm.  Neurological:     General: No focal deficit present.     Mental Status: He is alert and oriented to person, place, and time. Mental status is at baseline.  Psychiatric:        Mood and Affect: Mood normal.        Behavior: Behavior normal.        Thought Content: Thought content normal.        Judgment: Judgment normal.     BP 128/76   Pulse 68   Temp 98.1 F (36.7 C)   Ht 5\' 6"  (1.676 m)   Wt 177 lb 9.6 oz (80.6 kg)   SpO2 96%   BMI 28.67 kg/m  Wt Readings from Last 3 Encounters:  12/18/22 177 lb 9.6 oz (80.6 kg)  08/08/22 174 lb 8 oz (79.2  kg)  08/08/22 170 lb (77.1 kg)     Health Maintenance  Topic Date Due   Zoster Vaccines- Shingrix (1 of 2) Never done   FOOT EXAM  05/30/2016   DTaP/Tdap/Td (3 - Tdap) 04/13/2019   COVID-19 Vaccine (3 - Moderna risk series) 01/12/2020   HEMOGLOBIN A1C  02/07/2023   OPHTHALMOLOGY EXAM  02/08/2023   INFLUENZA VACCINE  03/21/2023   Medicare Annual Wellness (AWV)  08/09/2023   Diabetic kidney evaluation - eGFR measurement  08/15/2023   Diabetic kidney evaluation - Urine ACR  08/15/2023   Pneumonia Vaccine 42+ Years old  Completed   Hepatitis C Screening  Completed   HPV VACCINES  Aged Out   Fecal DNA (Cologuard)  Discontinued    There are no preventive care reminders to display for this patient.  Lab Results  Component Value Date   TSH 1.67 08/10/2010   Lab Results  Component Value Date   WBC 4.9 08/14/2022   HGB 14.6 08/14/2022   HCT 42.5 08/14/2022   MCV 92.6 08/14/2022   PLT 179.0 08/14/2022   Lab Results  Component Value Date   NA 140 08/14/2022   K 4.8 08/14/2022   CO2 28 08/14/2022   GLUCOSE 151 (H) 08/14/2022   BUN 18 08/14/2022   CREATININE 1.28 08/14/2022   BILITOT 0.8 08/14/2022   ALKPHOS 37 (L) 08/14/2022   AST 19 08/14/2022   ALT 25 08/14/2022   PROT 7.2 08/14/2022  ALBUMIN 4.6 08/14/2022   CALCIUM 10.3 08/14/2022   GFR 54.47 (L) 08/14/2022   Lab Results  Component Value Date   CHOL 118 08/14/2022   Lab Results  Component Value Date   HDL 29.60 (L) 08/14/2022   Lab Results  Component Value Date   LDLCALC 48 08/04/2021   Lab Results  Component Value Date   TRIG 220.0 (H) 08/14/2022   Lab Results  Component Value Date   CHOLHDL 4 08/14/2022   Lab Results  Component Value Date   HGBA1C 7.1 (A) 08/08/2022      Assessment & Plan:  Frequency of micturition Assessment & Plan: Uranalysis negative for blood, leukocytes or nitrites Will send urine for culture and call with result. Increase fluid intake and take azo and cranberry  tablets.   Orders: -     POCT Urinalysis Dipstick (Automated) -     Urine Culture  Other orders -     Amoxicillin; Take 1 tablet (500 mg total) by mouth 2 (two) times daily for 7 days.  Dispense: 14 tablet; Refill: 0    Follow-up: No follow-ups on file.   Kara Dies, NP

## 2022-12-18 NOTE — Patient Instructions (Signed)
Uranalysis shows no infection. Will send urine for culture and call with result. Increase fluid intake and take azo and cranberry tablets.

## 2022-12-20 LAB — URINE CULTURE
MICRO NUMBER:: 14892958
SPECIMEN QUALITY:: ADEQUATE

## 2022-12-21 MED ORDER — AMOXICILLIN 500 MG PO TABS: 500.0000 mg | ORAL_TABLET | Freq: Two times a day (BID) | ORAL | 0 refills | Status: AC

## 2022-12-24 DIAGNOSIS — R350 Frequency of micturition: Secondary | ICD-10-CM | POA: Insufficient documentation

## 2022-12-24 NOTE — Assessment & Plan Note (Signed)
Uranalysis negative for blood, leukocytes or nitrites Will send urine for culture and call with result. Increase fluid intake and take azo and cranberry tablets.

## 2023-01-09 ENCOUNTER — Other Ambulatory Visit: Payer: Self-pay | Admitting: Family Medicine

## 2023-02-07 ENCOUNTER — Other Ambulatory Visit: Payer: Self-pay | Admitting: Cardiovascular Disease

## 2023-02-07 NOTE — Telephone Encounter (Signed)
Pt is scheduled on 7/15.

## 2023-02-07 NOTE — Telephone Encounter (Signed)
Please schedule 12 month F/U appointment for 90 day refills. Thank you! 

## 2023-02-11 ENCOUNTER — Telehealth: Payer: Medicare HMO | Admitting: Family Medicine

## 2023-02-11 ENCOUNTER — Telehealth: Payer: Self-pay | Admitting: Family Medicine

## 2023-02-11 DIAGNOSIS — E785 Hyperlipidemia, unspecified: Secondary | ICD-10-CM

## 2023-02-11 DIAGNOSIS — E1165 Type 2 diabetes mellitus with hyperglycemia: Secondary | ICD-10-CM

## 2023-02-11 DIAGNOSIS — Z79899 Other long term (current) drug therapy: Secondary | ICD-10-CM

## 2023-02-11 NOTE — Telephone Encounter (Signed)
Pt called back regarding an vm he received about r/s his ov with Dr. Patsy Lager on today, 6/26. Pt asked if he could pick up a urine container & he also asked if Dr. Patsy Lager could submit lab orders in replacement of the f/u visit today? Call back # [phone]

## 2023-02-11 NOTE — Telephone Encounter (Signed)
I just spoke to [name] to reschedule his appointment.    Can you help schedule him both a lab appointment and a follow-up office visit with me one week after his blood draw?  I already placed all of his lab orders.

## 2023-02-11 NOTE — Telephone Encounter (Signed)
Spoke to pt, scheduled labs for 6/25 & scheduled f/u with Dr. Patsy Lager on 7/3.

## 2023-02-12 ENCOUNTER — Other Ambulatory Visit (INDEPENDENT_AMBULATORY_CARE_PROVIDER_SITE_OTHER): Payer: Medicare HMO

## 2023-02-12 DIAGNOSIS — E1165 Type 2 diabetes mellitus with hyperglycemia: Secondary | ICD-10-CM | POA: Diagnosis not present

## 2023-02-12 DIAGNOSIS — Z79899 Other long term (current) drug therapy: Secondary | ICD-10-CM

## 2023-02-12 DIAGNOSIS — E785 Hyperlipidemia, unspecified: Secondary | ICD-10-CM

## 2023-02-12 LAB — CBC WITH DIFFERENTIAL/PLATELET
Basophils Absolute: 0 10*3/uL (ref 0.0–0.1)
Basophils Relative: 0.7 % (ref 0.0–3.0)
Eosinophils Absolute: 0.2 10*3/uL (ref 0.0–0.7)
Eosinophils Relative: 3.4 % (ref 0.0–5.0)
HCT: 42.1 % (ref 39.0–52.0)
Hemoglobin: 14.5 g/dL (ref 13.0–17.0)
Lymphocytes Relative: 26.8 % (ref 12.0–46.0)
Lymphs Abs: 1.7 10*3/uL (ref 0.7–4.0)
MCHC: 34.4 g/dL (ref 30.0–36.0)
MCV: 93.2 fl (ref 78.0–100.0)
Monocytes Absolute: 0.5 10*3/uL (ref 0.1–1.0)
Monocytes Relative: 7.9 % (ref 3.0–12.0)
Neutro Abs: 3.9 10*3/uL (ref 1.4–7.7)
Neutrophils Relative %: 61.2 % (ref 43.0–77.0)
Platelets: 180 10*3/uL (ref 150.0–400.0)
RBC: 4.51 Mil/uL (ref 4.22–5.81)
RDW: 13 % (ref 11.5–15.5)
WBC: 6.4 10*3/uL (ref 4.0–10.5)

## 2023-02-12 LAB — HEMOGLOBIN A1C: Hgb A1c MFr Bld: 7.2 % — ABNORMAL HIGH (ref 4.6–6.5)

## 2023-02-12 LAB — LIPID PANEL
Cholesterol: 128 mg/dL (ref 0–200)
HDL: 29.4 mg/dL — ABNORMAL LOW (ref >39.00)
NonHDL: 98.33
Total CHOL/HDL Ratio: 4
Triglycerides: 209 mg/dL — ABNORMAL HIGH (ref 0.0–149.0)
VLDL: 41.8 mg/dL — ABNORMAL HIGH (ref 0.0–40.0)

## 2023-02-12 LAB — HEPATIC FUNCTION PANEL
ALT: 27 U/L (ref 0–53)
AST: 21 U/L (ref 0–37)
Albumin: 4.5 g/dL (ref 3.5–5.2)
Alkaline Phosphatase: 37 U/L — ABNORMAL LOW (ref 39–117)
Bilirubin, Direct: 0.2 mg/dL (ref 0.0–0.3)
Total Bilirubin: 0.9 mg/dL (ref 0.2–1.2)
Total Protein: 7 g/dL (ref 6.0–8.3)

## 2023-02-12 LAB — BASIC METABOLIC PANEL
BUN: 21 mg/dL (ref 6–23)
CO2: 27 mEq/L (ref 19–32)
Calcium: 10.2 mg/dL (ref 8.4–10.5)
Chloride: 102 mEq/L (ref 96–112)
Creatinine, Ser: 1.19 mg/dL (ref 0.40–1.50)
GFR: 59.24 mL/min — ABNORMAL LOW (ref >60.00)
Glucose, Bld: 158 mg/dL — ABNORMAL HIGH (ref 70–99)
Potassium: 4.5 mEq/L (ref 3.5–5.1)
Sodium: 139 mEq/L (ref 135–145)

## 2023-02-12 LAB — MICROALBUMIN / CREATININE URINE RATIO
Creatinine,U: 44 mg/dL
Microalb Creat Ratio: 1.6 mg/g (ref 0.0–30.0)
Microalb, Ur: <0.7 mg/dL (ref 0.0–1.9)

## 2023-02-12 LAB — LDL CHOLESTEROL, DIRECT: Direct LDL: 66 mg/dL

## 2023-02-19 NOTE — Progress Notes (Unsigned)
    Chastity Noland T. Kitana Gage, MD, CAQ Sports Medicine [facility] [address] [place], [address]  Phone: [phone]  FAX: [phone]  [name] - 77 y.o. male  MRN [id number]  Date of Birth: [birth year]  Date: 02/20/2023  PCP: Hannah Beat, MD  Referral: Hannah Beat, MD  No chief complaint on file.  Subjective:   [name] is a 77 y.o. very pleasant male patient with There is no height or weight on file to calculate BMI. who presents with the following:  [name] is a well-known patient for many years.  He presents in follow-up for diabetes, hypertension, hyperlipidemia.  Diabetes Mellitus: Tolerating Medications: yes Compliance with diet: fair, There is no height or weight on file to calculate BMI. Exercise: minimal / intermittent Avg blood sugars at home: not checking Foot problems: none Hypoglycemia: none No nausea, vomitting, blurred vision, polyuria.  Lab Results  Component Value Date   HGBA1C 7.2 (H) 02/12/2023   HGBA1C 7.1 (A) 08/08/2022   HGBA1C 7.7 (A) 02/05/2022   Lab Results  Component Value Date   MICROALBUR <0.7 02/12/2023   LDLCALC 48 08/04/2021   CREATININE 1.19 02/12/2023    Wt Readings from Last 3 Encounters:  12/18/22 177 lb 9.6 oz (80.6 kg)  08/08/22 174 lb 8 oz (79.2 kg)  08/08/22 170 lb (77.1 kg)    HTN: Tolerating all medications without side effects Stable and at goal No CP, no sob. No HA.  BP Readings from Last 3 Encounters:  12/18/22 128/76  08/08/22 130/72  04/26/22 123/72    Basic Metabolic Panel:    Component Value Date/Time   NA 139 02/12/2023 0826   K 4.5 02/12/2023 0826   CL 102 02/12/2023 0826   CO2 27 02/12/2023 0826   BUN 21 02/12/2023 0826   CREATININE 1.19 02/12/2023 0826   GLUCOSE 158 (H) 02/12/2023 0826   CALCIUM 10.2 02/12/2023 0826    Lipids: Doing well, stable. Tolerating meds fine with no SE. Panel reviewed with patient.  Lipids: Lab Results   Component Value Date   CHOL 128 02/12/2023   Lab Results  Component Value Date   HDL 29.40 (L) 02/12/2023   Lab Results  Component Value Date   LDLCALC 48 08/04/2021   Lab Results  Component Value Date   TRIG 209.0 (H) 02/12/2023   Lab Results  Component Value Date   CHOLHDL 4 02/12/2023    Lab Results  Component Value Date   ALT 27 02/12/2023   AST 21 02/12/2023   ALKPHOS 37 (L) 02/12/2023   BILITOT 0.9 02/12/2023     Review of Systems is noted in the HPI, as appropriate  Objective:   There were no vitals taken for this visit.  GEN: No acute distress; alert,appropriate. PULM: Breathing comfortably in no respiratory distress PSYCH: Normally interactive.   Laboratory and Imaging Data:  Assessment and Plan:   ***

## 2023-02-20 ENCOUNTER — Ambulatory Visit (INDEPENDENT_AMBULATORY_CARE_PROVIDER_SITE_OTHER): Payer: Medicare HMO | Admitting: Family Medicine

## 2023-02-20 ENCOUNTER — Encounter: Payer: Self-pay | Admitting: Family Medicine

## 2023-02-20 VITALS — BP 118/60 | HR 71 | Temp 98.1°F | Ht 66.0 in | Wt 175.0 lb

## 2023-02-20 DIAGNOSIS — I10 Essential (primary) hypertension: Secondary | ICD-10-CM | POA: Diagnosis not present

## 2023-02-20 DIAGNOSIS — I25810 Atherosclerosis of coronary artery bypass graft(s) without angina pectoris: Secondary | ICD-10-CM | POA: Diagnosis not present

## 2023-02-20 DIAGNOSIS — Z7984 Long term (current) use of oral hypoglycemic drugs: Secondary | ICD-10-CM | POA: Diagnosis not present

## 2023-02-20 DIAGNOSIS — E119 Type 2 diabetes mellitus without complications: Secondary | ICD-10-CM | POA: Diagnosis not present

## 2023-02-20 DIAGNOSIS — E785 Hyperlipidemia, unspecified: Secondary | ICD-10-CM | POA: Diagnosis not present

## 2023-02-23 ENCOUNTER — Other Ambulatory Visit: Payer: Self-pay | Admitting: Cardiovascular Disease

## 2023-03-02 ENCOUNTER — Emergency Department: Payer: Medicare HMO

## 2023-03-02 ENCOUNTER — Other Ambulatory Visit: Payer: Self-pay

## 2023-03-02 ENCOUNTER — Inpatient Hospital Stay
Admission: EM | Admit: 2023-03-02 | Discharge: 2023-03-07 | DRG: 372 | Disposition: A | Discharge status: Home / Self Care | Payer: Medicare HMO | Attending: Surgery | Admitting: Surgery

## 2023-03-02 DIAGNOSIS — K3532 Acute appendicitis with perforation and localized peritonitis, without abscess: Secondary | ICD-10-CM | POA: Diagnosis present

## 2023-03-02 DIAGNOSIS — Z882 Allergy status to sulfonamides status: Secondary | ICD-10-CM | POA: Diagnosis not present

## 2023-03-02 DIAGNOSIS — Z7982 Long term (current) use of aspirin: Secondary | ICD-10-CM

## 2023-03-02 DIAGNOSIS — Z811 Family history of alcohol abuse and dependence: Secondary | ICD-10-CM | POA: Diagnosis not present

## 2023-03-02 DIAGNOSIS — Z961 Presence of intraocular lens: Secondary | ICD-10-CM | POA: Diagnosis present

## 2023-03-02 DIAGNOSIS — Z79899 Other long term (current) drug therapy: Secondary | ICD-10-CM

## 2023-03-02 DIAGNOSIS — R188 Other ascites: Secondary | ICD-10-CM | POA: Diagnosis not present

## 2023-03-02 DIAGNOSIS — Z8249 Family history of ischemic heart disease and other diseases of the circulatory system: Secondary | ICD-10-CM | POA: Diagnosis not present

## 2023-03-02 DIAGNOSIS — Z1152 Encounter for screening for COVID-19: Secondary | ICD-10-CM | POA: Diagnosis not present

## 2023-03-02 DIAGNOSIS — F1010 Alcohol abuse, uncomplicated: Secondary | ICD-10-CM | POA: Diagnosis not present

## 2023-03-02 DIAGNOSIS — E871 Hypo-osmolality and hyponatremia: Secondary | ICD-10-CM | POA: Diagnosis present

## 2023-03-02 DIAGNOSIS — K3533 Acute appendicitis with perforation and localized peritonitis, with abscess: Secondary | ICD-10-CM | POA: Diagnosis not present

## 2023-03-02 DIAGNOSIS — Z87891 Personal history of nicotine dependence: Secondary | ICD-10-CM | POA: Diagnosis not present

## 2023-03-02 DIAGNOSIS — F32A Depression, unspecified: Secondary | ICD-10-CM | POA: Diagnosis present

## 2023-03-02 DIAGNOSIS — Z951 Presence of aortocoronary bypass graft: Secondary | ICD-10-CM

## 2023-03-02 DIAGNOSIS — I10 Essential (primary) hypertension: Secondary | ICD-10-CM | POA: Diagnosis present

## 2023-03-02 DIAGNOSIS — K8020 Calculus of gallbladder without cholecystitis without obstruction: Secondary | ICD-10-CM | POA: Diagnosis not present

## 2023-03-02 DIAGNOSIS — R935 Abnormal findings on diagnostic imaging of other abdominal regions, including retroperitoneum: Secondary | ICD-10-CM | POA: Diagnosis not present

## 2023-03-02 DIAGNOSIS — E785 Hyperlipidemia, unspecified: Secondary | ICD-10-CM | POA: Diagnosis present

## 2023-03-02 DIAGNOSIS — N179 Acute kidney failure, unspecified: Secondary | ICD-10-CM | POA: Diagnosis not present

## 2023-03-02 DIAGNOSIS — E1165 Type 2 diabetes mellitus with hyperglycemia: Secondary | ICD-10-CM | POA: Diagnosis present

## 2023-03-02 DIAGNOSIS — Z7984 Long term (current) use of oral hypoglycemic drugs: Secondary | ICD-10-CM | POA: Diagnosis not present

## 2023-03-02 DIAGNOSIS — I2510 Atherosclerotic heart disease of native coronary artery without angina pectoris: Secondary | ICD-10-CM | POA: Diagnosis present

## 2023-03-02 DIAGNOSIS — E119 Type 2 diabetes mellitus without complications: Secondary | ICD-10-CM

## 2023-03-02 DIAGNOSIS — R1031 Right lower quadrant pain: Secondary | ICD-10-CM | POA: Diagnosis not present

## 2023-03-02 HISTORY — DX: Acute appendicitis with perforation, localized peritonitis, and gangrene, without abscess: K35.32

## 2023-03-02 LAB — COMPREHENSIVE METABOLIC PANEL
ALT: 39 U/L (ref 0–44)
AST: 29 U/L (ref 15–41)
Albumin: 4 g/dL (ref 3.5–5.0)
Alkaline Phosphatase: 47 U/L (ref 38–126)
Anion gap: 15 (ref 5–15)
BUN: 24 mg/dL — ABNORMAL HIGH (ref 8–23)
CO2: 18 mmol/L — ABNORMAL LOW (ref 22–32)
Calcium: 9.3 mg/dL (ref 8.9–10.3)
Chloride: 100 mmol/L (ref 98–111)
Creatinine, Ser: 1.38 mg/dL — ABNORMAL HIGH (ref 0.61–1.24)
GFR, Estimated: 53 mL/min — ABNORMAL LOW (ref >60)
Glucose, Bld: 230 mg/dL — ABNORMAL HIGH (ref 70–99)
Potassium: 4 mmol/L (ref 3.5–5.1)
Sodium: 133 mmol/L — ABNORMAL LOW (ref 135–145)
Total Bilirubin: 2.3 mg/dL — ABNORMAL HIGH (ref 0.3–1.2)
Total Protein: 7.3 g/dL (ref 6.5–8.1)

## 2023-03-02 LAB — RESP PANEL BY RT-PCR (RSV, FLU A&B, COVID)  RVPGX2
Influenza A by PCR: NEGATIVE
Influenza B by PCR: NEGATIVE
Resp Syncytial Virus by PCR: NEGATIVE
SARS Coronavirus 2 by RT PCR: NEGATIVE

## 2023-03-02 LAB — CBC
HCT: 43.3 % (ref 39.0–52.0)
Hemoglobin: 15 g/dL (ref 13.0–17.0)
MCH: 31.5 pg (ref 26.0–34.0)
MCHC: 34.6 g/dL (ref 30.0–36.0)
MCV: 91 fL (ref 80.0–100.0)
Platelets: 148 10*3/uL — ABNORMAL LOW (ref 150–400)
RBC: 4.76 MIL/uL (ref 4.22–5.81)
RDW: 12 % (ref 11.5–15.5)
WBC: 12 10*3/uL — ABNORMAL HIGH (ref 4.0–10.5)
nRBC: 0 % (ref 0.0–0.2)

## 2023-03-02 LAB — GLUCOSE, CAPILLARY
Glucose-Capillary: 200 mg/dL — ABNORMAL HIGH (ref 70–99)
Glucose-Capillary: 206 mg/dL — ABNORMAL HIGH (ref 70–99)

## 2023-03-02 LAB — LIPASE, BLOOD: Lipase: 37 U/L (ref 11–51)

## 2023-03-02 MED ORDER — PIPERACILLIN-TAZOBACTAM 3.375 G IVPB 30 MIN
3.3750 g | Freq: Once | INTRAVENOUS | Status: AC
  Administered 2023-03-02: 3.375 g via INTRAVENOUS
  Filled 2023-03-02: qty 50

## 2023-03-02 MED ORDER — ACETAMINOPHEN 325 MG PO TABS
650.0000 mg | ORAL_TABLET | Freq: Four times a day (QID) | ORAL | Status: DC | PRN
  Administered 2023-03-02 – 2023-03-07 (×4): 650 mg via ORAL
  Filled 2023-03-02 (×5): qty 2

## 2023-03-02 MED ORDER — SODIUM CHLORIDE 0.9 % IV SOLN: INTRAVENOUS | Status: DC

## 2023-03-02 MED ORDER — DIPHENHYDRAMINE HCL 12.5 MG/5ML PO ELIX: 12.5000 mg | ORAL_SOLUTION | Freq: Four times a day (QID) | ORAL | Status: DC | PRN

## 2023-03-02 MED ORDER — HYDROCODONE-ACETAMINOPHEN 5-325 MG PO TABS
1.0000 | ORAL_TABLET | ORAL | Status: DC | PRN
  Administered 2023-03-04 – 2023-03-06 (×4): 2 via ORAL
  Filled 2023-03-02 (×4): qty 2

## 2023-03-02 MED ORDER — PIPERACILLIN-TAZOBACTAM 3.375 G IVPB
3.3750 g | Freq: Three times a day (TID) | INTRAVENOUS | Status: DC
  Administered 2023-03-02 – 2023-03-07 (×14): 3.375 g via INTRAVENOUS
  Filled 2023-03-02 (×12): qty 50

## 2023-03-02 MED ORDER — DOXAZOSIN MESYLATE 1 MG PO TABS
1.0000 mg | ORAL_TABLET | Freq: Every day | ORAL | Status: DC
  Administered 2023-03-03 – 2023-03-07 (×5): 1 mg via ORAL
  Filled 2023-03-02 (×5): qty 1

## 2023-03-02 MED ORDER — HYDROMORPHONE HCL 1 MG/ML IJ SOLN: 1.0000 mg | INTRAMUSCULAR | Status: DC | PRN

## 2023-03-02 MED ORDER — DIPHENHYDRAMINE HCL 50 MG/ML IJ SOLN: 12.5000 mg | Freq: Four times a day (QID) | INTRAMUSCULAR | Status: DC | PRN

## 2023-03-02 MED ORDER — LOSARTAN POTASSIUM 50 MG PO TABS
100.0000 mg | ORAL_TABLET | Freq: Every day | ORAL | Status: DC
  Administered 2023-03-03 – 2023-03-07 (×5): 100 mg via ORAL
  Filled 2023-03-02 (×5): qty 2

## 2023-03-02 MED ORDER — INSULIN ASPART 100 UNIT/ML IJ SOLN
0.0000 [IU] | Freq: Three times a day (TID) | INTRAMUSCULAR | Status: DC
  Administered 2023-03-02: 3 [IU] via SUBCUTANEOUS
  Administered 2023-03-03: 5 [IU] via SUBCUTANEOUS
  Administered 2023-03-03 (×2): 3 [IU] via SUBCUTANEOUS
  Administered 2023-03-04: 5 [IU] via SUBCUTANEOUS
  Administered 2023-03-04 (×2): 3 [IU] via SUBCUTANEOUS
  Administered 2023-03-05: 2 [IU] via SUBCUTANEOUS
  Administered 2023-03-05 – 2023-03-06 (×4): 3 [IU] via SUBCUTANEOUS
  Administered 2023-03-06: 5 [IU] via SUBCUTANEOUS
  Administered 2023-03-07 (×2): 3 [IU] via SUBCUTANEOUS
  Filled 2023-03-02 (×13): qty 1

## 2023-03-02 MED ORDER — CALCIUM CARBONATE ANTACID 500 MG PO CHEW
1.0000 | CHEWABLE_TABLET | Freq: Every day | ORAL | Status: DC | PRN
  Filled 2023-03-02: qty 1

## 2023-03-02 MED ORDER — HYDRALAZINE HCL 20 MG/ML IJ SOLN: 10.0000 mg | INTRAMUSCULAR | Status: DC | PRN

## 2023-03-02 MED ORDER — ONDANSETRON HCL 4 MG/2ML IJ SOLN
4.0000 mg | Freq: Four times a day (QID) | INTRAMUSCULAR | Status: DC | PRN
  Administered 2023-03-05: 4 mg via INTRAVENOUS
  Filled 2023-03-02: qty 2

## 2023-03-02 MED ORDER — AMLODIPINE BESYLATE 5 MG PO TABS
10.0000 mg | ORAL_TABLET | Freq: Every day | ORAL | Status: DC
  Administered 2023-03-03 – 2023-03-07 (×5): 10 mg via ORAL
  Filled 2023-03-02 (×5): qty 2

## 2023-03-02 MED ORDER — SODIUM CHLORIDE 0.9 % IV BOLUS
1000.0000 mL | Freq: Once | INTRAVENOUS | Status: AC
  Administered 2023-03-02: 1000 mL via INTRAVENOUS

## 2023-03-02 MED ORDER — IOHEXOL 300 MG/ML  SOLN
75.0000 mL | Freq: Once | INTRAMUSCULAR | Status: AC | PRN
  Administered 2023-03-02: 75 mL via INTRAVENOUS

## 2023-03-02 MED ORDER — PANTOPRAZOLE SODIUM 40 MG IV SOLR
40.0000 mg | Freq: Every day | INTRAVENOUS | Status: DC
  Administered 2023-03-02 – 2023-03-06 (×5): 40 mg via INTRAVENOUS
  Filled 2023-03-02 (×5): qty 10

## 2023-03-02 MED ORDER — ATORVASTATIN CALCIUM 20 MG PO TABS
80.0000 mg | ORAL_TABLET | Freq: Every day | ORAL | Status: DC
  Administered 2023-03-03 – 2023-03-07 (×5): 80 mg via ORAL
  Filled 2023-03-02 (×5): qty 4

## 2023-03-02 MED ORDER — BOOST / RESOURCE BREEZE PO LIQD CUSTOM
1.0000 | Freq: Three times a day (TID) | ORAL | Status: DC
  Administered 2023-03-02 – 2023-03-07 (×3): 1 via ORAL

## 2023-03-02 MED ORDER — METOPROLOL SUCCINATE ER 50 MG PO TB24
50.0000 mg | ORAL_TABLET | Freq: Every day | ORAL | Status: DC
  Administered 2023-03-03 – 2023-03-07 (×5): 50 mg via ORAL
  Filled 2023-03-02 (×5): qty 1

## 2023-03-02 MED ORDER — ONDANSETRON 4 MG PO TBDP: 4.0000 mg | ORAL_TABLET | Freq: Four times a day (QID) | ORAL | Status: DC | PRN

## 2023-03-02 NOTE — Plan of Care (Signed)
  Problem: Coping: Goal: Ability to adjust to condition or change in health will improve Outcome: Progressing   Problem: Metabolic: Goal: Ability to maintain appropriate glucose levels will improve Outcome: Progressing   Problem: Nutritional: Goal: Maintenance of adequate nutrition will improve Outcome: Progressing   Problem: Skin Integrity: Goal: Risk for impaired skin integrity will decrease Outcome: Progressing   Problem: Clinical Measurements: Goal: Cardiovascular complication will be avoided Outcome: Progressing

## 2023-03-02 NOTE — H&P (Addendum)
Patient ID: [name], male   DOB: 12-23-1945, 77 y.o.   MRN: [id number]  Chief Complaint: Right lower quadrant pain  History of Present Illness [name] is a 77 y.o. male with a prior history of back in the 10s of a similar pain that subsided.  Today is Saturday, Tuesday afternoon he began having some shifting nonlocalized abdominal pain, this continued through the week, subsequently associated most recently with shivers.  Reporting that his head felt warm to touch, no fever measured.  Today the pain became localized to the right lower quadrant, and is apparently resolved elsewhere. He denies any prior history aside from that quite remotely.  Denies any dysuria, diarrhea constipation, nausea, vomiting.  He reports anorexia and diminished p.o. intake. He reports his blood sugars have been higher than anticipated considering he has not eaten well.  Past Medical History Past Medical History:  Diagnosis Date   Alcohol abuse, unspecified    CAD (coronary artery disease) 04/20/2005   CABG x4   Colon polyps    Depression    DMII (diabetes mellitus, type 2) (HCC) 05/20/2002   HLD (hyperlipidemia) 05/20/2002   HTN (hypertension) 05/20/2002   Motion sickness    planes, deep sea fishing      Past Surgical History:  Procedure Laterality Date   CATARACT EXTRACTION W/PHACO Right 04/12/2022   Procedure: CATARACT EXTRACTION PHACO AND INTRAOCULAR LENS PLACEMENT (IOC) RIGHT MALYUGIN OMIDRIA IRIS HOOKS;  Surgeon: Estanislado Pandy, MD;  Location: [facility];  Service: Ophthalmology;  Laterality: Right;  16.16 1:33.8   CATARACT EXTRACTION W/PHACO Left 04/26/2022   Procedure: CATARACT EXTRACTION PHACO AND INTRAOCULAR LENS PLACEMENT (IOC) LEFT MALYUGIN OMIDIRA IRIS HOOKS;  Surgeon: Estanislado Pandy, MD;  Location: [facility];  Service: Ophthalmology;  Laterality: Left;  14.02 2:13.8   COLONOSCOPY     CORONARY ARTERY BYPASS GRAFT  9/06   x4    ESOPHAGOGASTRODUODENOSCOPY  7/07   polyp; mucosal abnml   TONSILLECTOMY      Allergies  Allergen Reactions   Sulfonamide Derivatives     REACTION: unknown reaction    No current facility-administered medications for this encounter.   Current Outpatient Medications  Medication Sig Dispense Refill   ACCU-CHEK SOFTCLIX LANCETS lancets Check blood sugar once daily and as instructed. Dx E11.65 100 each 3   amLODipine (NORVASC) 10 MG tablet TAKE 1 TABLET BY MOUTH EVERY DAY 90 tablet 1   aspirin 81 MG tablet Take 1 tablet (81 mg total) by mouth daily.     atorvastatin (LIPITOR) 80 MG tablet TAKE 1 TABLET(80 MG) BY MOUTH DAILY 90 tablet 3   Blood Glucose Monitoring Suppl (ACCU-CHEK AVIVA PLUS) w/Device KIT Check blood sugar once daily and as instructed. Dx E11.65 1 kit 0   calcium carbonate (TUMS - DOSED IN MG ELEMENTAL CALCIUM) 500 MG chewable tablet Chew 1 tablet by mouth daily as needed for indigestion or heartburn.     doxazosin (CARDURA) 1 MG tablet TAKE 1 TABLET(1 MG) BY MOUTH DAILY 30 tablet 0   fish oil-omega-3 fatty acids 1000 MG capsule Take 2 g by mouth daily.     glucose blood (ACCU-CHEK AVIVA PLUS) test strip Check blood sugar once daily and as instructed. Dx E11.65 100 each 3   losartan (COZAAR) 100 MG tablet TAKE 1 TABLET(100 MG) BY MOUTH DAILY 90 tablet 0   metFORMIN (GLUCOPHAGE-XR) 500 MG 24 hr tablet TAKE 4 TABLETS BY MOUTH EVERY DAY WITH BREAKFAST 360 tablet 1   metoprolol  succinate (TOPROL-XL) 50 MG 24 hr tablet Take 1 tablet (50 mg total) by mouth daily. PLEASE SCHEDULE OFFICE VISIT FOR FURTHER REFILLS. THANK YOU! 30 tablet 0   Multiple Vitamin (MULTIVITAMIN) tablet Take 1 tablet by mouth daily.      Family History Family History  Problem Relation Age of Onset   Alcohol abuse Father    Stroke Father    Other Father        cardiac problems   Heart disease Father    COPD Mother    Alcohol abuse Brother    Cirrhosis Brother        EtOH   Anxiety disorder Brother     Heart disease Paternal Grandmother    Colon cancer Neg Hx    Esophageal cancer Neg Hx    Rectal cancer Neg Hx    Stomach cancer Neg Hx       Social History Social History   Tobacco Use   Smoking status: Former   Smokeless tobacco: Never   Tobacco comments:    quit 41 years ago  Vaping Use   Vaping status: Never Used  Substance Use Topics   Alcohol use: Yes    Alcohol/week: 17.0 standard drinks of alcohol    Types: 2 Glasses of wine, 15 Shots of liquor per week    Comment: 2 glasses of wine or 3-4 shots of bourbon daily   Drug use: No        Review of Systems  Constitutional:  Positive for chills and fever.  HENT: Negative.    Eyes: Negative.   Respiratory:  Negative for cough.   Cardiovascular:  Negative for chest pain.  Gastrointestinal:  Positive for abdominal pain. Negative for blood in stool, constipation, diarrhea, melena, nausea and vomiting.  Genitourinary:  Negative for dysuria.  Skin: Negative.   Neurological: Negative.   Psychiatric/Behavioral: Negative.       Physical Exam Blood pressure 131/67, pulse 93, temperature 100.1 F (37.8 C), temperature source Oral, resp. rate 18, height 5\' 6"  (1.676 m), weight 73 kg, SpO2 99%. Last Weight  Most recent update: 03/02/2023 11:55 AM    Weight  73 kg (161 lb)             CONSTITUTIONAL: Well developed, and nourished, appropriately responsive and aware without distress.   EYES: Sclera non-icteric.   EARS, NOSE, MOUTH AND THROAT: The oropharynx is clear. Oral mucosa is pink and moist.    Hearing is intact to voice.  NECK: Trachea is midline, and there is no jugular venous distension.  LYMPH NODES:  Lymph nodes in the neck are not appreciated. RESPIRATORY:  Lungs are clear, and breath sounds are equal bilaterally.  Normal respiratory effort without pathologic use of accessory muscles. CARDIOVASCULAR: Heart is regular in rate and rhythm.   Well perfused.  GI: The abdomen is tender in the right lower  quadrant and flank area, otherwise soft, nontender, and nondistended. There were no palpable masses.  I did not appreciate hepatosplenomegaly.  MUSCULOSKELETAL:  Symmetrical muscle tone appreciated in all four extremities.    SKIN: Skin turgor is normal. No pathologic skin lesions appreciated.  NEUROLOGIC:  Motor and sensation appear grossly normal.  Cranial nerves are grossly without defect. PSYCH:  Alert and oriented to person, place and time. Affect is appropriate for situation.  Data Reviewed I have personally reviewed what is currently available of the patient's imaging, recent labs and medical records.   Labs:     Latest Ref Rng & Units  03/02/2023   11:53 AM 02/12/2023    8:26 AM 08/14/2022    8:49 AM  CBC  WBC 4.0 - 10.5 K/uL 12.0  6.4  4.9   Hemoglobin 13.0 - 17.0 g/dL 16.1  09.6  04.5   Hematocrit 39.0 - 52.0 % 43.3  42.1  42.5   Platelets 150 - 400 K/uL 148  180.0  179.0       Latest Ref Rng & Units 03/02/2023   11:53 AM 02/12/2023    8:26 AM 08/14/2022    8:49 AM  CMP  Glucose 70 - 99 mg/dL 409  811  914   BUN 8 - 23 mg/dL 24  21  18    Creatinine 0.61 - 1.24 mg/dL 7.82  9.56  2.13   Sodium 135 - 145 mmol/L 133  139  140   Potassium 3.5 - 5.1 mmol/L 4.0  4.5  4.8   Chloride 98 - 111 mmol/L 100  102  102   CO2 22 - 32 mmol/L 18  27  28    Calcium 8.9 - 10.3 mg/dL 9.3  08.6  57.8   Total Protein 6.5 - 8.1 g/dL 7.3  7.0  7.2   Total Bilirubin 0.3 - 1.2 mg/dL 2.3  0.9  0.8   Alkaline Phos 38 - 126 U/L 47  37  37   AST 15 - 41 U/L 29  21  19    ALT 0 - 44 U/L 39  27  25       Imaging: Radiological images reviewed:   Within last 24 hrs: CT ABDOMEN PELVIS W CONTRAST  Result Date: 03/02/2023 CLINICAL DATA:  Right lower quadrant abdominal pain. EXAM: CT ABDOMEN AND PELVIS WITH CONTRAST TECHNIQUE: Multidetector CT imaging of the abdomen and pelvis was performed using the standard protocol following bolus administration of intravenous contrast. RADIATION DOSE REDUCTION: This  exam was performed according to the departmental dose-optimization program which includes automated exposure control, adjustment of the mA and/or kV according to patient size and/or use of iterative reconstruction technique. CONTRAST:  75mL OMNIPAQUE IOHEXOL 300 MG/ML  SOLN COMPARISON:  None Available. FINDINGS: Lower chest: Prior median sternotomy.  Lung bases are clear. Hepatobiliary: Cholelithiasis without gallbladder distension or inflammatory changes. Normal appearance of the liver. Tiny hypodensity in the right hepatic lobe on image 26/2 is likely an incidental finding. No biliary dilatation. Pancreas: Unremarkable. No pancreatic ductal dilatation or surrounding inflammatory changes. Spleen: Normal in size without focal abnormality. Adrenals/Urinary Tract: Normal adrenal glands. Tiny hypodensity in the right kidney lower pole is too small to definitively characterize and does not require dedicated follow-up. No hydronephrosis. No suspicious renal lesions. Urinary bladder has mild wall thickening with moderate distention. Stomach/Bowel: Severe inflammatory changes centered around an abnormal appendix. Appendix has multiple appendicoliths. Proximal/mid aspect of the appendix measures up to 1.5 cm. Small amount of fluid along the posterior caudal aspect of the appendix on image 54/2. Findings compatible with a perforated appendicitis. Extensive inflammatory changes in the right abdomen with wall thickening involving the terminal ileum likely secondary to the appendix inflammation. Focal fluid just posterior to the terminal ileum on image 51/2 measures 1.5 cm. Normal appearance of the stomach. Vascular/Lymphatic: Coronary artery calcifications. Atherosclerotic calcifications in the abdominal aorta without aneurysm. Splenic artery calcifications. Round low-density structure in the left periaortic region measures 1.3 cm in the short axis on image 29/2. Not clear if this represents enlarged lymph node or fluid-filled  structure. Overall, there is not significant lymph node enlargement in the  abdomen or pelvis. Reproductive: Prostate has calcifications. Other: Trace free fluid in the pelvis. Extensive inflammatory changes in the right lower quadrant centered around the appendix inflammation. No evidence for extraluminal gas. Musculoskeletal: No acute bone abnormality. IMPRESSION: 1. Severe inflammatory changes centered around an abnormal appendix. Findings are compatible with a perforated appendicitis. Small amount of fluid around the appendix and terminal ileum. Findings could represent developing abscess collections. These collections are too small for percutaneous drainage at this time. 2. Extensive inflammatory changes in the right abdomen with wall thickening involving the terminal ileum. Findings are likely secondary to the appendix inflammation. 3. Cholelithiasis without evidence for acute cholecystitis. 4. Mild urinary bladder wall thickening. Correlate with urinalysis. Findings could be related to chronic bladder outlet obstruction. 5. Coronary artery calcifications. 6. Aortic Atherosclerosis (ICD10-I70.0). These results were called by telephone at the time of interpretation on 03/02/2023 at 1:31 pm to provider Willy Eddy , who verbally acknowledged these results. Electronically Signed   By: Richarda Overlie M.D.   On: 03/02/2023 13:32    Assessment    Complicated appendicitis with likely perforation, surrounding phlegmon and early abscess formation. Patient Active Problem List   Diagnosis Date Noted   CAD (coronary artery disease), autologous vein bypass graft 10/11/2011   Essential hypertension    Alcohol abuse 08/10/2010   Diabetes mellitus treated with oral medication (HCC) 04/13/2009   Hyperlipidemia LDL goal <70 05/13/2008   History of colonic polyps 03/12/2007   DEPRESSION 12/03/2006    Plan    Options reviewed of proceeding to the OR urgently, versus initial treatment with IV antibiotics, possible  eventual percutaneous drainage or eventual return to the OR pending clinical progress. As he has not had much in way of nausea or vomiting, allow him to continue clear liquids for now.  We were still in resuscitative mode and will monitor his urine output, continue IV fluids until follow-up BMP/current vital signs show improvement in euvolemia. Sliding scale insulin. Continue IV Zosyn.  PPI prophylaxis, SCD prophylaxis.  Face-to-face time spent with the patient and accompanying care providers(if present) was 60 minutes, with more than 50% of the time spent counseling, educating, and coordinating care of the patient.    These notes generated with voice recognition software. I apologize for typographical errors.  Campbell Lerner M.D., FACS 03/02/2023, 2:36 PM

## 2023-03-02 NOTE — ED Provider Notes (Signed)
[facility] Provider Note    Event Date/Time   First MD Initiated Contact with Patient 03/02/23 1159     (approximate)   History   Abdominal Pain   HPI  [name] is a 77 y.o. male no significant past medical history presents to the ER for evaluation of development of right lower quadrant abdominal pain associate with some nausea fevers and rigors at home.  Denies any dysuria.  No flank pain.  No previous surgeries.     Physical Exam   Triage Vital Signs: ED Triage Vitals  Encounter Vitals Group     BP 03/02/23 1148 139/81     Systolic BP Percentile --      Diastolic BP Percentile --      Pulse Rate 03/02/23 1148 (!) 114     Resp --      Temp 03/02/23 1148 100.1 F (37.8 C)     Temp Source 03/02/23 1148 Oral     SpO2 03/02/23 1148 93 %     Weight 03/02/23 1154 161 lb (73 kg)     Height 03/02/23 1154 5\' 6"  (1.676 m)     Head Circumference --      Peak Flow --      Pain Score 03/02/23 1146 6     Pain Loc --      Pain Education --      Exclude from Growth Chart --     Most recent vital signs: Vitals:   03/02/23 1148 03/02/23 1230  BP: 139/81 102/74  Pulse: (!) 114 94  Resp:  17  Temp: 100.1 F (37.8 C)   SpO2: 93% 98%     Constitutional: Alert  Eyes: Conjunctivae are normal.  Head: Atraumatic. Nose: No congestion/rhinnorhea. Mouth/Throat: Mucous membranes are moist.   Neck: Painless ROM.  Cardiovascular:   Good peripheral circulation. Respiratory: Normal respiratory effort.  No retractions.  Gastrointestinal: Soft with tenderness to palpation the right lower quadrant. Musculoskeletal:  no deformity Neurologic:  MAE spontaneously. No gross focal neurologic deficits are appreciated.  Skin:  Skin is warm, dry and intact. No rash noted. Psychiatric: Mood and affect are normal. Speech and behavior are normal.    ED Results / Procedures / Treatments   Labs (all labs ordered are listed, but only abnormal results are  displayed) Labs Reviewed  COMPREHENSIVE METABOLIC PANEL - Abnormal; Notable for the following components:      Result Value   Sodium 133 (*)    CO2 18 (*)    Glucose, Bld 230 (*)    BUN 24 (*)    Creatinine, Ser 1.38 (*)    Total Bilirubin 2.3 (*)    GFR, Estimated 53 (*)    All other components within normal limits  CBC - Abnormal; Notable for the following components:   WBC 12.0 (*)    Platelets 148 (*)    All other components within normal limits  RESP PANEL BY RT-PCR (RSV, FLU A&B, COVID)  [id number]  LIPASE, BLOOD  URINALYSIS, ROUTINE W REFLEX MICROSCOPIC     EKG     RADIOLOGY Please see ED Course for my review and interpretation.  I personally reviewed all radiographic images ordered to evaluate for the above acute complaints and reviewed radiology reports and findings.  These findings were personally discussed with the patient.  Please see medical record for radiology report.    PROCEDURES:  Critical Care performed: No  Procedures   MEDICATIONS ORDERED IN ED: Medications  piperacillin-tazobactam (ZOSYN) IVPB 3.375 g (3.375 g Intravenous New Bag/Given 03/02/23 1342)  sodium chloride 0.9 % bolus 1,000 mL (1,000 mLs Intravenous New Bag/Given 03/02/23 1218)  iohexol (OMNIPAQUE) 300 MG/ML solution 75 mL (75 mLs Intravenous Contrast Given 03/02/23 1301)     IMPRESSION / MDM / ASSESSMENT AND PLAN / ED COURSE  I reviewed the triage vital signs and the nursing notes.                              Differential diagnosis includes, but is not limited to, appendicitis, colitis, diverticulitis, mass, stone, pyelonephritis  Patient presenting to the ER for evaluation of symptoms as described above.  Based on symptoms, risk factors and considered above differential, this presenting complaint could reflect a potentially life-threatening illness therefore the patient will be placed on continuous pulse oximetry and telemetry for monitoring.  Laboratory evaluation will be sent to  evaluate for the above complaints.  Will give IV fluids as well as order CT imaging to evaluate for the but differential.    Clinical Course as of 03/02/23 1354  Sat Mar 02, 2023  1322 CT imaging on my review and interpretation is concerning for acute appendicitis.  Will await formal radiology report. [PR]  1326 Have ordered antibiotics. [PR]  1334 Case discussed in consultation with Dr. Claudine Mouton of general surgery. [PR]    Clinical Course User Index [PR] Willy Eddy, MD     FINAL CLINICAL IMPRESSION(S) / ED DIAGNOSES   Final diagnoses:  Perforated appendicitis     Rx / DC Orders   ED Discharge Orders     None        Note:  This document was prepared using Dragon voice recognition software and may include unintentional dictation errors.    Willy Eddy, MD 03/02/23 [phone]

## 2023-03-02 NOTE — ED Triage Notes (Signed)
Pt co of central upper abd pain x 4 days. Pt states decreased appetite. Pt denies n/v, diarrhea.

## 2023-03-03 DIAGNOSIS — K3533 Acute appendicitis with perforation and localized peritonitis, with abscess: Secondary | ICD-10-CM | POA: Diagnosis not present

## 2023-03-03 LAB — URINALYSIS, ROUTINE W REFLEX MICROSCOPIC
Bacteria, UA: NONE SEEN
Bilirubin Urine: NEGATIVE
Glucose, UA: NEGATIVE mg/dL
Hgb urine dipstick: NEGATIVE
Ketones, ur: 20 mg/dL — AB
Leukocytes,Ua: NEGATIVE
Nitrite: NEGATIVE
Protein, ur: 30 mg/dL — AB
Specific Gravity, Urine: 1.042 — ABNORMAL HIGH (ref 1.005–1.030)
pH: 5 (ref 5.0–8.0)

## 2023-03-03 LAB — URINALYSIS, DIPSTICK ONLY
Bilirubin Urine: NEGATIVE
Glucose, UA: NEGATIVE mg/dL
Hgb urine dipstick: NEGATIVE
Ketones, ur: 5 mg/dL — AB
Leukocytes,Ua: NEGATIVE
Nitrite: NEGATIVE
Protein, ur: 30 mg/dL — AB
Specific Gravity, Urine: 1.042 — ABNORMAL HIGH (ref 1.005–1.030)
pH: 5 (ref 5.0–8.0)

## 2023-03-03 LAB — GLUCOSE, CAPILLARY
Glucose-Capillary: 215 mg/dL — ABNORMAL HIGH (ref 70–99)
Glucose-Capillary: 195 mg/dL — ABNORMAL HIGH (ref 70–99)
Glucose-Capillary: 181 mg/dL — ABNORMAL HIGH (ref 70–99)
Glucose-Capillary: 256 mg/dL — ABNORMAL HIGH (ref 70–99)

## 2023-03-03 LAB — BASIC METABOLIC PANEL
Anion gap: 7 (ref 5–15)
BUN: 22 mg/dL (ref 8–23)
CO2: 23 mmol/L (ref 22–32)
Calcium: 8.4 mg/dL — ABNORMAL LOW (ref 8.9–10.3)
Chloride: 103 mmol/L (ref 98–111)
Creatinine, Ser: 1.26 mg/dL — ABNORMAL HIGH (ref 0.61–1.24)
GFR, Estimated: 59 mL/min — ABNORMAL LOW (ref >60)
Glucose, Bld: 232 mg/dL — ABNORMAL HIGH (ref 70–99)
Potassium: 3.6 mmol/L (ref 3.5–5.1)
Sodium: 133 mmol/L — ABNORMAL LOW (ref 135–145)

## 2023-03-03 LAB — CBC
HCT: 36.6 % — ABNORMAL LOW (ref 39.0–52.0)
Hemoglobin: 13.3 g/dL (ref 13.0–17.0)
MCH: 32 pg (ref 26.0–34.0)
MCHC: 36.3 g/dL — ABNORMAL HIGH (ref 30.0–36.0)
MCV: 88 fL (ref 80.0–100.0)
Platelets: 136 10*3/uL — ABNORMAL LOW (ref 150–400)
RBC: 4.16 MIL/uL — ABNORMAL LOW (ref 4.22–5.81)
RDW: 12 % (ref 11.5–15.5)
WBC: 11 10*3/uL — ABNORMAL HIGH (ref 4.0–10.5)
nRBC: 0 % (ref 0.0–0.2)

## 2023-03-03 NOTE — Progress Notes (Deleted)
Cardiology Office Note  Date:  03/03/2023   ID:  [name], DOB 1945/09/30, MRN [id number]  PCP:  Gregory Beat, MD   No chief complaint on file.  HPI:  [name] is a very pleasant 77 yo gentleman with remote history of  coronary artery disease, bypass surgery in 2006 x5 vessel  hyperlipidemia,  diabetes, hypertension,  alcohol daily, 2-4 a day who presents for routine followup of his coronary artery disease  LOV July 23  Echo January 2024 EF 55 to 60%, grade 2 diastolic dysfunction mild aortic valve stenosis gradient 11 mmHg   Feels well, Dragging in the afternoon BP well controlled Heart rate sometimes low 48 to 50, 55 today on metoprolol tartrate 50 BID  Labs reviewed A1C 7.7, up from 6.7, "french fries" Total chol 112, LDL 48  2 glasses of wine per night Bought motorcycle  Active, 10K steps a day  Walks around his property No chest pain concerning for angina  EKG personally reviewed by myself on todays visit  showing sinus bradycardia, rate 55 bpm, no significant ST or T-wave changes   Other past medical history  labs dated 05/18/2014 showing total cholesterol 126, LDL 62, hemoglobin A1c 7.0 He reports that he is alternating Lipitor 80 mg with 40 mg    He has not had a cardiac catheterization since his bypass surgery   PMH:   has a past medical history of Alcohol abuse, unspecified, CAD (coronary artery disease) (04/20/2005), Colon polyps, Depression, DMII (diabetes mellitus, type 2) (HCC) (05/20/2002), HLD (hyperlipidemia) (05/20/2002), HTN (hypertension) (05/20/2002), and Motion sickness.  PSH:    Past Surgical History:  Procedure Laterality Date   CATARACT EXTRACTION W/PHACO Right 04/12/2022   Procedure: CATARACT EXTRACTION PHACO AND INTRAOCULAR LENS PLACEMENT (IOC) RIGHT MALYUGIN OMIDRIA IRIS HOOKS;  Surgeon: Estanislado Pandy, MD;  Location: [facility];  Service: Ophthalmology;  Laterality: Right;  16.16 1:33.8   CATARACT  EXTRACTION W/PHACO Left 04/26/2022   Procedure: CATARACT EXTRACTION PHACO AND INTRAOCULAR LENS PLACEMENT (IOC) LEFT MALYUGIN OMIDIRA IRIS HOOKS;  Surgeon: Estanislado Pandy, MD;  Location: [facility];  Service: Ophthalmology;  Laterality: Left;  14.02 2:13.8   COLONOSCOPY     CORONARY ARTERY BYPASS GRAFT  9/06   x4   ESOPHAGOGASTRODUODENOSCOPY  7/07   polyp; mucosal abnml   TONSILLECTOMY      No current facility-administered medications for this visit.   No current outpatient medications on file.   Facility-Administered Medications Ordered in Other Visits  Medication Dose Route Frequency Provider Last Rate Last Admin   0.9 %  sodium chloride infusion   Intravenous Continuous Campbell Lerner, MD 125 mL/hr at 03/03/23 0554 New Bag at 03/03/23 0554   acetaminophen (TYLENOL) tablet 650 mg  650 mg Oral Q6H PRN Campbell Lerner, MD   650 mg at 03/02/23 1551   amLODipine (NORVASC) tablet 10 mg  10 mg Oral Daily Campbell Lerner, MD   10 mg at 03/03/23 0930   atorvastatin (LIPITOR) tablet 80 mg  80 mg Oral Daily Campbell Lerner, MD   80 mg at 03/03/23 0930   calcium carbonate (TUMS - dosed in mg elemental calcium) chewable tablet 200 mg of elemental calcium  1 tablet Oral Daily PRN Campbell Lerner, MD       diphenhydrAMINE (BENADRYL) 12.5 MG/5ML elixir 12.5 mg  12.5 mg Oral Q6H PRN Campbell Lerner, MD       Or   diphenhydrAMINE (BENADRYL) injection 12.5 mg  12.5 mg Intravenous Q6H PRN Rodenberg,  Katherina Right, MD       doxazosin (CARDURA) tablet 1 mg  1 mg Oral Daily Campbell Lerner, MD   1 mg at 03/03/23 0930   feeding supplement (BOOST / RESOURCE BREEZE) liquid 1 Container  1 Container Oral TID BM Campbell Lerner, MD   1 Container at 03/02/23 2113   hydrALAZINE (APRESOLINE) injection 10 mg  10 mg Intravenous Q2H PRN Campbell Lerner, MD       HYDROcodone-acetaminophen (NORCO/VICODIN) 5-325 MG per tablet 1-2 tablet  1-2 tablet Oral Q4H PRN Campbell Lerner, MD       HYDROmorphone  (DILAUDID) injection 1 mg  1 mg Intravenous Q4H PRN Campbell Lerner, MD       insulin aspart (novoLOG) injection 0-15 Units  0-15 Units Subcutaneous TID WC Campbell Lerner, MD   3 Units at 03/03/23 1204   losartan (COZAAR) tablet 100 mg  100 mg Oral Daily Campbell Lerner, MD   100 mg at 03/03/23 0930   metoprolol succinate (TOPROL-XL) 24 hr tablet 50 mg  50 mg Oral Daily Campbell Lerner, MD   50 mg at 03/03/23 0930   ondansetron (ZOFRAN-ODT) disintegrating tablet 4 mg  4 mg Oral Q6H PRN Campbell Lerner, MD       Or   ondansetron [facility]) injection 4 mg  4 mg Intravenous Q6H PRN Campbell Lerner, MD       pantoprazole (PROTONIX) injection 40 mg  40 mg Intravenous QHS Campbell Lerner, MD   40 mg at 03/02/23 2112   piperacillin-tazobactam (ZOSYN) IVPB 3.375 g  3.375 g Intravenous Clide Cliff, MD 12.5 mL/hr at 03/03/23 0820 Infusion Verify at 03/03/23 0820    Allergies:   Sulfonamide derivatives   Social History:  The patient  reports that he has quit smoking. He has never used smokeless tobacco. He reports current alcohol use of about 17.0 standard drinks of alcohol per week. He reports that he does not use drugs.   Family History:   family history includes Alcohol abuse in his brother and father; Anxiety disorder in his brother; COPD in his mother; Cirrhosis in his brother; Heart disease in his father and paternal grandmother; Other in his father; Stroke in his father.    Review of Systems: Review of Systems  Constitutional: Negative.   Respiratory: Negative.    Cardiovascular: Negative.   Gastrointestinal: Negative.   Musculoskeletal: Negative.   Neurological: Negative.   Psychiatric/Behavioral: Negative.    All other systems reviewed and are negative.   PHYSICAL EXAM: VS:  There were no vitals taken for this visit. , BMI There is no height or weight on file to calculate BMI.  Constitutional:  oriented to person, place, and time. No distress.  HENT:  Head: Grossly  normal Eyes:  no discharge. No scleral icterus.  Neck: No JVD, no carotid bruits  Cardiovascular: Regular rate and rhythm, no murmurs appreciated Pulmonary/Chest: Clear to auscultation bilaterally, no wheezes or rails Abdominal: Soft.  no distension.  no tenderness.  Musculoskeletal: Normal range of motion Neurological:  normal muscle tone. Coordination normal. No atrophy Skin: Skin warm and dry Psychiatric: normal affect, pleasant  Recent Labs: 03/02/2023: ALT 39 03/03/2023: BUN 22; Creatinine, Ser 1.26; Hemoglobin 13.3; Platelets 136; Potassium 3.6; Sodium 133    Lipid Panel Lab Results  Component Value Date   CHOL 128 02/12/2023   HDL 29.40 (L) 02/12/2023   LDLCALC 48 08/04/2021   TRIG 209.0 (H) 02/12/2023      Wt Readings from Last 3 Encounters:  03/02/23 167 lb  5.3 oz (75.9 kg)  02/20/23 175 lb (79.4 kg)  12/18/22 177 lb 9.6 oz (80.6 kg)     ASSESSMENT AND PLAN:   Coronary artery disease involving autologous vein coronary bypass graft without angina pectoris - Plan: EKG 12-Lead 2006 bypass surgery,, denies anginal symptoms Non-smoker, cholesterol at goal No further workup at this time. Continue current medication regimen.  Essential hypertension - Plan: EKG 12-Lead Blood pressure mildly elevated, numbers well controlled at home  We will decrease metoprolol for bradycardia and afternoon fatigue  Hyperlipidemia LDL goal <70 Cholesterol is at goal on the current lipid regimen. No changes to the medications were made.  controlled type 2 diabetes mellitus with other circulatory complication, without long-term current use of insulin (HCC) Weight stable  Alcohol abuse Moderation of alcohol recommended  GERD stable  Smoker Quit years ago   Total encounter time more than 30 minutes  Greater than 50% was spent in counseling and coordination of care with the patient     No orders of the defined types were placed in this encounter.    Signed, Dossie Arbour,  M.D., Ph.D. 03/03/2023  [facility] [place], [place] [phone]

## 2023-03-03 NOTE — Plan of Care (Signed)
  Problem: Education: Goal: Ability to describe self-care measures that may prevent or decrease complications (Diabetes Survival Skills Education) will improve Outcome: Progressing Goal: Individualized Educational Video(s) Outcome: Progressing   Problem: Coping: Goal: Ability to adjust to condition or change in health will improve Outcome: Progressing   Problem: Fluid Volume: Goal: Ability to maintain a balanced intake and output will improve Outcome: Progressing   Problem: Health Behavior/Discharge Planning: Goal: Ability to identify and utilize available resources and services will improve Outcome: Progressing Goal: Ability to manage health-related needs will improve Outcome: Progressing   Problem: Metabolic: Goal: Ability to maintain appropriate glucose levels will improve Outcome: Progressing   Problem: Nutritional: Goal: Maintenance of adequate nutrition will improve Outcome: Progressing Goal: Progress toward achieving an optimal weight will improve Outcome: Progressing   Problem: Skin Integrity: Goal: Risk for impaired skin integrity will decrease Outcome: Progressing   Problem: Tissue Perfusion: Goal: Adequacy of tissue perfusion will improve Outcome: Progressing   Problem: Education: Goal: Knowledge of General Education information will improve Description: Including pain rating scale, medication(s)/side effects and non-pharmacologic comfort measures Outcome: Progressing   Problem: Health Behavior/Discharge Planning: Goal: Ability to manage health-related needs will improve Outcome: Progressing   Problem: Clinical Measurements: Goal: Ability to maintain clinical measurements within normal limits will improve Outcome: Progressing Goal: Will remain free from infection Outcome: Progressing Goal: Diagnostic test results will improve Outcome: Progressing Goal: Respiratory complications will improve Outcome: Progressing Goal: Cardiovascular complication will  be avoided Outcome: Progressing   Problem: Activity: Goal: Risk for activity intolerance will decrease Outcome: Progressing   Problem: Nutrition: Goal: Adequate nutrition will be maintained Outcome: Progressing   Problem: Coping: Goal: Level of anxiety will decrease Outcome: Progressing   Problem: Elimination: Goal: Will not experience complications related to bowel motility Outcome: Progressing Goal: Will not experience complications related to urinary retention Outcome: Progressing   Problem: Pain Managment: Goal: General experience of comfort will improve Outcome: Progressing   Problem: Safety: Goal: Ability to remain free from injury will improve Outcome: Progressing   Problem: Skin Integrity: Goal: Risk for impaired skin integrity will decrease Outcome: Progressing  Pt stable during the shift and focus on nutrition intake and on continuous Iv abx; safety maintained and  no harmful event occurred

## 2023-03-03 NOTE — Progress Notes (Signed)
[facility] SURGICAL PROGRESS NOTE (cpt [phone])  Hospital Day(s): 1.   Post op day(s):  Marland Kitchen   Interval History: Patient seen and examined, no acute events or new complaints overnight. Patient reports fevers/chills similar to preop but slightly improved, denies nausea or vomiting or worsening abdominal pain.  Tmax 101.1 Fahrenheit.  WBC with slight improvement.  AKI has improved,  Review of Systems:  HEENT: denies cough or congestion  Respiratory: denies any shortness of breath  Cardiovascular: denies chest pain or palpitations  Gastrointestinal: denies change in abdominal pain, N/V, or diarrhea/and bowel function as per interval history Genitourinary: denies burning with urination or urinary frequency Musculoskeletal: denies pain, decreased motor or sensation Integumentary: denies any other rashes or skin discolorations Neurological: denies HA or vision/hearing changes   Vital signs in last 24 hours: [min-max] current  Temp:  [98.7 F (37.1 C)-101.1 F (38.4 C)] 99.5 F (37.5 C) (07/14 0808) Pulse Rate:  [76-95] 89 (07/14 0808) Resp:  [17-18] 18 (07/14 0808) BP: (102-145)/(64-88) 145/75 (07/14 0808) SpO2:  [93 %-99 %] 96 % (07/14 0808) Weight:  [73 kg-75.9 kg] 75.9 kg (07/13 1600)     Height: 5\' 6"  (167.6 cm) Weight: 75.9 kg BMI (Calculated): 27.02   Intake/Output last 2 shifts:  07/13 0701 - 07/14 0700 In: 1540.3 [P.O.:480; I.V.:10.3; IV Piggyback:1050] Out: -    Physical Exam:  Constitutional: alert, cooperative and no distress  Respiratory: breathing non-labored at rest  Cardiovascular: regular rate and sinus rhythm  Gastrointestinal: Right lower quadrant tenderness without remarkable change, otherwise abdomen remains soft, non-tender, and non-distended Musculoskeletal: UE and LE FROM, no edema or wounds, motor and sensation grossly intact, NT    Labs:     Latest Ref Rng & Units 03/03/2023    3:48 AM 03/02/2023   11:53 AM 02/12/2023    8:26 AM  CBC  WBC  4.0 - 10.5 K/uL 11.0  12.0  6.4   Hemoglobin 13.0 - 17.0 g/dL 78.2  95.6  21.3   Hematocrit 39.0 - 52.0 % 36.6  43.3  42.1   Platelets 150 - 400 K/uL 136  148  180.0       Latest Ref Rng & Units 03/03/2023    3:48 AM 03/02/2023   11:53 AM 02/12/2023    8:26 AM  CMP  Glucose 70 - 99 mg/dL 086  578  469   BUN 8 - 23 mg/dL 22  24  21    Creatinine 0.61 - 1.24 mg/dL 6.29  5.28  4.13   Sodium 135 - 145 mmol/L 133  133  139   Potassium 3.5 - 5.1 mmol/L 3.6  4.0  4.5   Chloride 98 - 111 mmol/L 103  100  102   CO2 22 - 32 mmol/L 23  18  27    Calcium 8.9 - 10.3 mg/dL 8.4  9.3  24.4   Total Protein 6.5 - 8.1 g/dL  7.3  7.0   Total Bilirubin 0.3 - 1.2 mg/dL  2.3  0.9   Alkaline Phos 38 - 126 U/L  47  37   AST 15 - 41 U/L  29  21   ALT 0 - 44 U/L  39  27      Imaging studies: No new pertinent imaging studies   Assessment/Plan:  77 y.o. male with complicated appendicitis for antibiotic management, anticipating follow-up imaging pending patient's continued progress with nonoperative approach, available to change course and initiate operative drainage as indicated by patient's clinical regress/progress Pertinent comorbidities  including: Patient Active Problem List   Diagnosis Date Noted   Perforated appendicitis 03/02/2023   Acute perforated appendicitis 03/02/2023   CAD (coronary artery disease), autologous vein bypass graft 10/11/2011   Essential hypertension    Alcohol abuse 08/10/2010   Diabetes mellitus treated with oral medication (HCC) 04/13/2009   Hyperlipidemia LDL goal <70 05/13/2008   History of colonic polyps 03/12/2007   DEPRESSION 12/03/2006     -Continue Zosyn,  -M ay liberalize diet beyond clear liquids for now as tolerated.  -Continue serial exams, repeat CBC in a.m.; follow-up BMP.  -Continue IV fluid support  -Continue DVT prophylaxis PPI prophylaxis.  All of the above findings and recommendations were discussed with the patient, patient's family, and the medical  team, and all of patient's and family's questions were answered to his expressed satisfaction.   -- Campbell Lerner M.D., [facility] 03/03/2023 11:54 AM

## 2023-03-03 NOTE — Plan of Care (Signed)
  Problem: Coping: Goal: Ability to adjust to condition or change in health will improve Outcome: Progressing   Problem: Nutritional: Goal: Maintenance of adequate nutrition will improve Outcome: Progressing   Problem: Pain Managment: Goal: General experience of comfort will improve Outcome: Progressing   

## 2023-03-04 ENCOUNTER — Encounter: Payer: Self-pay | Admitting: Surgery

## 2023-03-04 ENCOUNTER — Inpatient Hospital Stay: Payer: Medicare HMO

## 2023-03-04 ENCOUNTER — Other Ambulatory Visit: Payer: Self-pay | Admitting: Cardiovascular Disease

## 2023-03-04 ENCOUNTER — Ambulatory Visit: Payer: Medicare HMO | Admitting: Cardiovascular Disease

## 2023-03-04 DIAGNOSIS — Z789 Other specified health status: Secondary | ICD-10-CM

## 2023-03-04 DIAGNOSIS — I25118 Atherosclerotic heart disease of native coronary artery with other forms of angina pectoris: Secondary | ICD-10-CM

## 2023-03-04 DIAGNOSIS — E782 Mixed hyperlipidemia: Secondary | ICD-10-CM

## 2023-03-04 DIAGNOSIS — I10 Essential (primary) hypertension: Secondary | ICD-10-CM

## 2023-03-04 DIAGNOSIS — K3533 Acute appendicitis with perforation and localized peritonitis, with abscess: Secondary | ICD-10-CM | POA: Diagnosis not present

## 2023-03-04 DIAGNOSIS — E1165 Type 2 diabetes mellitus with hyperglycemia: Secondary | ICD-10-CM

## 2023-03-04 DIAGNOSIS — I350 Nonrheumatic aortic (valve) stenosis: Secondary | ICD-10-CM

## 2023-03-04 LAB — CBC
HCT: 39.9 % (ref 39.0–52.0)
Hemoglobin: 14.2 g/dL (ref 13.0–17.0)
MCH: 31.6 pg (ref 26.0–34.0)
MCHC: 35.6 g/dL (ref 30.0–36.0)
MCV: 88.9 fL (ref 80.0–100.0)
Platelets: 153 10*3/uL (ref 150–400)
RBC: 4.49 MIL/uL (ref 4.22–5.81)
RDW: 11.9 % (ref 11.5–15.5)
WBC: 11.3 10*3/uL — ABNORMAL HIGH (ref 4.0–10.5)
nRBC: 0 % (ref 0.0–0.2)

## 2023-03-04 LAB — GLUCOSE, CAPILLARY
Glucose-Capillary: 185 mg/dL — ABNORMAL HIGH (ref 70–99)
Glucose-Capillary: 248 mg/dL — ABNORMAL HIGH (ref 70–99)
Glucose-Capillary: 180 mg/dL — ABNORMAL HIGH (ref 70–99)
Glucose-Capillary: 152 mg/dL — ABNORMAL HIGH (ref 70–99)

## 2023-03-04 LAB — BASIC METABOLIC PANEL
Anion gap: 7 (ref 5–15)
BUN: 12 mg/dL (ref 8–23)
CO2: 22 mmol/L (ref 22–32)
Calcium: 8.3 mg/dL — ABNORMAL LOW (ref 8.9–10.3)
Chloride: 105 mmol/L (ref 98–111)
Creatinine, Ser: 1.05 mg/dL (ref 0.61–1.24)
GFR, Estimated: >60 mL/min (ref >60)
Glucose, Bld: 219 mg/dL — ABNORMAL HIGH (ref 70–99)
Potassium: 3.8 mmol/L (ref 3.5–5.1)
Sodium: 134 mmol/L — ABNORMAL LOW (ref 135–145)

## 2023-03-04 MED ORDER — FENTANYL CITRATE (PF) 100 MCG/2ML IJ SOLN
INTRAMUSCULAR | Status: AC
  Filled 2023-03-04: qty 2

## 2023-03-04 MED ORDER — MIDAZOLAM HCL 5 MG/5ML IJ SOLN
INTRAMUSCULAR | Status: AC | PRN
  Administered 2023-03-04: 1 mg via INTRAVENOUS

## 2023-03-04 MED ORDER — ADULT MULTIVITAMIN W/MINERALS CH
1.0000 | ORAL_TABLET | Freq: Every day | ORAL | Status: DC
  Administered 2023-03-05 – 2023-03-07 (×3): 1 via ORAL
  Filled 2023-03-04 (×3): qty 1

## 2023-03-04 MED ORDER — IOHEXOL 300 MG/ML  SOLN
100.0000 mL | Freq: Once | INTRAMUSCULAR | Status: AC | PRN
  Administered 2023-03-04: 100 mL via INTRAVENOUS

## 2023-03-04 MED ORDER — FENTANYL CITRATE (PF) 100 MCG/2ML IJ SOLN
INTRAMUSCULAR | Status: AC | PRN
  Administered 2023-03-04 (×2): 50 ug via INTRAVENOUS

## 2023-03-04 MED ORDER — MIDAZOLAM HCL 2 MG/2ML IJ SOLN
INTRAMUSCULAR | Status: AC
  Filled 2023-03-04: qty 2

## 2023-03-04 MED ORDER — IOHEXOL 9 MG/ML PO SOLN
500.0000 mL | ORAL | Status: AC
  Administered 2023-03-04 (×2): 500 mL

## 2023-03-04 MED ORDER — LIDOCAINE 1 % OPTIME INJ - NO CHARGE
10.0000 mL | Freq: Once | INTRAMUSCULAR | Status: AC
  Administered 2023-03-04: 10 mL via INTRADERMAL
  Filled 2023-03-04: qty 10

## 2023-03-04 NOTE — Telephone Encounter (Signed)
Pt is currently admitted in hospital at [facility] and asked if we could reach out in about a week to schedule his 12 mo follow up. Pt also wanted to know if he could please have his med refills to hold him over until we can get him seen for his follow up appt with G[facility]? Thank you.

## 2023-03-04 NOTE — Progress Notes (Signed)
[facility]ESS [facility] (cpt [phone])  Hospital Day(s): 2.  Interval History: Patient seen and examined, no acute events or new complaints overnight. Patient reports he is feeling very bloated from PO contrast. Still with RLQ pain but improved from presentation. No nausea, emesis. He continues to have fever; 100.51F this morning (T-Max 100.7 at 2100). Leukocytosis is stable; 11.3K. Hgb to 14.2. Renal function normalized; sCr - 1.05; UO - 100 ccs + unmeasured. Mild hyponatremia to 134. He continues on Zosyn. He is NPO.   Review of Systems:  Constitutional: + fever, denied chills   HEENT: denies cough or congestion  Respiratory: denies any shortness of breath  Cardiovascular: denies chest pain or palpitations  Gastrointestinal: + abdominal pain, denied N/V Genitourinary: denies burning with urination or urinary frequency Musculoskeletal: denies pain, decreased motor or sensation  Vital signs in last 24 hours: [min-max] current  Temp:  [97.2 F (36.2 C)-100.7 F (38.2 C)] 100.4 F (38 C) (07/15 0509) Pulse Rate:  [87-90] 87 (07/15 0509) Resp:  [16-20] 20 (07/15 0509) BP: (129-140)/(69-74) 137/74 (07/15 0509) SpO2:  [93 %-95 %] 93 % (07/15 0509)     Height: 5\' 6"  (167.6 cm) Weight: 75.9 kg BMI (Calculated): 27.02   Intake/Output last 2 shifts:  07/14 0701 - 07/15 0700 In: 2456.4 [P.O.:850; I.V.:1532.4; IV Piggyback:74] Out: 100 [Urine:100]   Physical Exam:  Constitutional: alert, cooperative and no distress  HENT: normocephalic without obvious abnormality  Eyes: PERRL, EOM's grossly intact and symmetric  Respiratory: breathing non-labored at rest  Cardiovascular: regular rate and sinus rhythm  Gastrointestinal: Abdomen is soft, he is mildly distended, tenderness in RLQ, no rebound/guarding. He is not overtly peritonitic Musculoskeletal: no edema or wounds, motor and sensation grossly intact, NT    Labs:     Latest Ref Rng & Units 03/04/2023    5:30 AM  03/03/2023    3:48 AM 03/02/2023   11:53 AM  CBC  WBC 4.0 - 10.5 K/uL 11.3  11.0  12.0   Hemoglobin 13.0 - 17.0 g/dL 19.1  47.8  29.5   Hematocrit 39.0 - 52.0 % 39.9  36.6  43.3   Platelets 150 - 400 K/uL 153  136  148       Latest Ref Rng & Units 03/04/2023    5:30 AM 03/03/2023    3:48 AM 03/02/2023   11:53 AM  CMP  Glucose 70 - 99 mg/dL 621  308  657   BUN 8 - 23 mg/dL 12  22  24    Creatinine 0.61 - 1.24 mg/dL 8.46  9.62  9.52   Sodium 135 - 145 mmol/L 134  133  133   Potassium 3.5 - 5.1 mmol/L 3.8  3.6  4.0   Chloride 98 - 111 mmol/L 105  103  100   CO2 22 - 32 mmol/L 22  23  18    Calcium 8.9 - 10.3 mg/dL 8.3  8.4  9.3   Total Protein 6.5 - 8.1 g/dL   7.3   Total Bilirubin 0.3 - 1.2 mg/dL   2.3   Alkaline Phos 38 - 126 U/L   47   AST 15 - 41 U/L   29   ALT 0 - 44 U/L   39      Imaging studies:   CT Abdomen/Pelvis (03/04/2023) personally reviewed showing developing/worsening fluid collection consistent with abscess, and radiologist report pending...   Assessment/Plan: (ICD-10's: K35.32) 77 y.o. male with perforated appendicitis with developing abscess   - Reviewed  CT Abdomen/Pelvis this morning concerning for developing abscess, likely source of his fever. Discussed case and images with IR who are in agreement with percutaneous drainage; appreciate their assistance  - Continue NPO; IVF Support  - IV Abx (Zosyn)  - No emergent surgical intervention. He understands we will continue with conservative measures in an effort to avoid more extensive surgical resection in this setting. He will ultimately need interval appendectomy in ~8 weeks once recovered from this insult.   - Monitor abdominal examination; on-going bowel function  - Pain control prn; antiemetics prn  - Monitor fever curve - Monitor leukocytosis; morning labs   - Mobilize as tolerated    All of the above findings and recommendations were discussed with the patient, and the medical team, and all of patient's  questions were answered to his expressed satisfaction.   -- Lynden Oxford, PA-C [facility] 03/04/2023, 8:17 AM M-F: 7am - 4pm

## 2023-03-04 NOTE — TOC CM/SW Note (Signed)
Transition of Care [facility]) - Inpatient Brief Assessment   Patient Details  Name: [name] MRN: [id number] Date of Birth: 1946-02-25  [facility] CM/SW Contact:    Margarito Liner, LCSW Phone Number: 03/04/2023, 12:23 PM   Clinical Narrative: CSW reviewed chart. No TOC needs identified at this time. CSW will continue to follow progress. Please place [facility] consult if any needs arise.  Transition of Care Asessment: Insurance and Status: Insurance coverage has been reviewed Patient has primary care physician: Yes Home environment has been reviewed: Single family home Prior level of function:: Not documented Prior/Current Home Services: No current home services Social Determinants of Health Reivew: SDOH reviewed no interventions necessary Readmission risk has been reviewed: Yes Transition of care needs: no transition of care needs at this time

## 2023-03-04 NOTE — Progress Notes (Signed)
Initial Nutrition Assessment  DOCUMENTATION CODES:   Not applicable  INTERVENTION:   -Continue Boost Breeze po TID, each supplement provides 250 kcal and 9 grams of protein  -MVI with minerals daily -RD will follow for diet advancement and adjust supplement regimen as appropriate  NUTRITION DIAGNOSIS:   Increased nutrient needs related to acute illness as evidenced by estimated needs.  GOAL:   Patient will meet greater than or equal to 90% of their needs  MONITOR:   PO intake, Supplement acceptance, Diet advancement  REASON FOR ASSESSMENT:   Malnutrition Screening Tool    ASSESSMENT:   Pt with a prior history of back in the 80s of a similar pain that subsided. 4 days PTA pt began having some shifting nonlocalized abdominal pain, this continued through the week, subsequently associated most recently with shivers.  Pt admitted with perforated appendicitus with developing abscess.   Reviewed I/O's: +3.9 L since admission  Per general surgery notes, CT Abdomen/Pelvis this morning concerning for developing abscess, likely source of his fever. Plan for drainage with IR today; pt currently NPO for procedure. No plan for emergent surgical intervention at this time.   Spoke with pt at bedside, who was pleasant and in good spirits today. He reports feeling a little better and rested well last night. Pt shares that he has been tolerating clear liquids well and drinking Boost Breeze, but wary of sugar content. Noted meal completions 25-50%. Pt reports he was able to have a BM yesterday.  Pt reports poor oral intake over the past 4-5 days PTA secondary to pain. Prior to acute illness, pt reports good appetite. He consumes 3 meals per day. He has been trying to be mindful of portions sizes and choosing more healthful foods to help maintain his weight. Pt shares that he also tries to stay active and walks 8,000-10,000 steps daily.   Pt reports his UBW is around 167#. He estimates he has  lost about 6# within the past 5 days. Reviewed wt hx; pt has experienced a 4.2% wt loss over the past 6 months,which is not significant for time frame.  Discussed importance of good meal and supplement intake to promote healing. Pt amenable to continue supplements after discussing importance of them.   Medications reviewed and include protonix.   Lab Results  Component Value Date   HGBA1C 7.2 (H) 02/12/2023   PTA DM medications are 2000 mg metformin daily.   Labs reviewed: CBGS: 134, CBGS: 180 (inpatient orders for glycemic control are 0-15 units insulin aspart TID with meals).    NUTRITION - FOCUSED PHYSICAL EXAM:  Flowsheet Row Most Recent Value  Orbital Region No depletion  Upper Arm Region No depletion  Thoracic and Lumbar Region No depletion  Buccal Region No depletion  Temple Region No depletion  Clavicle Bone Region No depletion  Clavicle and Acromion Bone Region No depletion  Scapular Bone Region No depletion  Dorsal Hand No depletion  Patellar Region No depletion  Anterior Thigh Region No depletion  Posterior Calf Region No depletion  Edema (RD Assessment) None  Hair Reviewed  Eyes Reviewed  Mouth Reviewed  Skin Reviewed       Diet Order:   Diet Order             Diet NPO time specified Except for: Sips with Meds  Diet effective midnight                   EDUCATION NEEDS:   Education needs have been  addressed  Skin:  Skin Assessment: Reviewed RN Assessment  Last BM:  02/25/23  Height:   Ht Readings from Last 1 Encounters:  03/02/23 5\' 6"  (1.676 m)    Weight:   Wt Readings from Last 1 Encounters:  03/02/23 75.9 kg    Ideal Body Weight:  64.5 kg  BMI:  Body mass index is 27.01 kg/m.  Estimated Nutritional Needs:   Kcal:  1700-1900  Protein:  90-105 grams  Fluid:  > 1.7 L    Levada Schilling, RD, LDN, CDCES Registered Dietitian II Certified Diabetes Care and Education Specialist Please refer to [facility] for RD and/or RD  on-call/weekend/after hours pager

## 2023-03-04 NOTE — Inpatient Diabetes Management (Signed)
Inpatient Diabetes Program Recommendations  AACE/ADA: New Consensus Statement on Inpatient Glycemic Control (2015)  Target Ranges:  Prepandial:   less than 140 mg/dL      Peak postprandial:   less than 180 mg/dL (1-2 hours)      Critically ill patients:  140 - 180 mg/dL    Latest Reference Range & Units 03/03/23 08:08 03/03/23 11:24 03/03/23 16:34 03/03/23 21:17  Glucose-Capillary 70 - 99 mg/dL 062 (H) 376 (H) 283 (H) 256 (H)  (H): Data is abnormally high  Latest Reference Range & Units 03/04/23 09:10 03/04/23 11:38  Glucose-Capillary 70 - 99 mg/dL 151 (H) 761 (H)  (H): Data is abnormally high    Home DM Meds: Metformin 500 mg Daily  Current Orders: Novolog Moderate Correction Scale/ SSI (0-15 units) TID AC    MD- Please consider adding low dose basal insulin:  Semglee 7 units at bedtime (0.1 units/kg)     --Will follow patient during hospitalization--  Ambrose Finland RN, MSN, CDCES Diabetes Coordinator Inpatient Glycemic Control Team Team Pager: [phone] (8a-5p)

## 2023-03-04 NOTE — Plan of Care (Signed)
  Problem: Education: Goal: Ability to describe self-care measures that may prevent or decrease complications (Diabetes Survival Skills Education) will improve Outcome: Progressing   Problem: Skin Integrity: Goal: Risk for impaired skin integrity will decrease Outcome: Progressing   Problem: Education: Goal: Knowledge of General Education information will improve Description: Including pain rating scale, medication(s)/side effects and non-pharmacologic comfort measures Outcome: Progressing   Problem: Pain Managment: Goal: General experience of comfort will improve Outcome: Progressing   Problem: Elimination: Goal: Will not experience complications related to bowel motility Outcome: Progressing

## 2023-03-04 NOTE — Consult Note (Signed)
Chief Complaint: Patient was seen in consultation today for perforated appendicitis  Referring Physician(s): Lynden Oxford, PA-C  Supervising Physician: Pernell Dupre  Patient Status: [facility] - In-pt  History of Present Illness: [name] is a 77 y.o. male with PMH significant for alcohol abuse, CAD s/p CABG x4, type II diabetes mellitus, hyperlipidemia, and hypertension being seen today in relation to perforated appendicitis. Patient presented to [facility] ED on 03/02/23 with RLQ abdominal pain, nausea, fevers, and rigors. CT AP performed 7/13 revealed concern for perforated appendicitis. Repeat CT AP on 03/04/23 revealed concern for RLQ abscess secondary to suspected perforated appendicitis. IR was consulted for image-guided abdominal drain placement.   Past Medical History:  Diagnosis Date   Alcohol abuse, unspecified    CAD (coronary artery disease) 04/20/2005   CABG x4   Colon polyps    Depression    DMII (diabetes mellitus, type 2) (HCC) 05/20/2002   HLD (hyperlipidemia) 05/20/2002   HTN (hypertension) 05/20/2002   Motion sickness    planes, deep sea fishing    Past Surgical History:  Procedure Laterality Date   CATARACT EXTRACTION W/PHACO Right 04/12/2022   Procedure: CATARACT EXTRACTION PHACO AND INTRAOCULAR LENS PLACEMENT (IOC) RIGHT MALYUGIN OMIDRIA IRIS HOOKS;  Surgeon: Estanislado Pandy, MD;  Location: [facility];  Service: Ophthalmology;  Laterality: Right;  16.16 1:33.8   CATARACT EXTRACTION W/PHACO Left 04/26/2022   Procedure: CATARACT EXTRACTION PHACO AND INTRAOCULAR LENS PLACEMENT (IOC) LEFT MALYUGIN OMIDIRA IRIS HOOKS;  Surgeon: Estanislado Pandy, MD;  Location: [facility];  Service: Ophthalmology;  Laterality: Left;  14.02 2:13.8   COLONOSCOPY     CORONARY ARTERY BYPASS GRAFT  9/06   x4   ESOPHAGOGASTRODUODENOSCOPY  7/07   polyp; mucosal abnml   TONSILLECTOMY      Allergies: Sulfonamide derivatives  Medications: Prior to  Admission medications   Medication Sig Start Date End Date Taking? Authorizing Provider  aspirin 81 MG tablet Take 1 tablet (81 mg total) by mouth daily. 06/11/17  Yes Gollan, Tollie Pizza, MD  atorvastatin (LIPITOR) 80 MG tablet TAKE 1 TABLET(80 MG) BY MOUTH DAILY Patient taking differently: Take 80 mg by mouth daily. TAKE 1 TABLET(80 MG) BY MOUTH DAILY 09/16/22  Yes Copland, Karleen Hampshire, MD  calcium carbonate (TUMS - DOSED IN MG ELEMENTAL CALCIUM) 500 MG chewable tablet Chew 1 tablet by mouth daily as needed for indigestion or heartburn.   Yes [provider]  doxazosin (CARDURA) 1 MG tablet TAKE 1 TABLET(1 MG) BY MOUTH DAILY Patient taking differently: Take 1 mg by mouth daily. 02/25/23  Yes Gollan, Tollie Pizza, MD  fish oil-omega-3 fatty acids 1000 MG capsule Take 2 g by mouth daily.   Yes [provider]  losartan (COZAAR) 100 MG tablet TAKE 1 TABLET(100 MG) BY MOUTH DAILY Patient taking differently: Take 100 mg by mouth daily. TAKE 1 TABLET(100 MG) BY MOUTH DAILY 01/09/23  Yes Copland, Karleen Hampshire, MD  metFORMIN (GLUCOPHAGE-XR) 500 MG 24 hr tablet TAKE 4 TABLETS BY MOUTH EVERY DAY WITH BREAKFAST Patient taking differently: Take 500 mg by mouth daily with breakfast. TAKE 4 TABLETS BY MOUTH EVERY DAY WITH BREAKFAST 10/01/22  Yes Copland, Karleen Hampshire, MD  metoprolol succinate (TOPROL-XL) 50 MG 24 hr tablet Take 1 tablet (50 mg total) by mouth daily. PLEASE SCHEDULE OFFICE VISIT FOR FURTHER REFILLS. THANK YOU! 02/07/23  Yes Antonieta Iba, MD  Multiple Vitamin (MULTIVITAMIN) tablet Take 1 tablet by mouth daily.   Yes [provider]  ACCU-CHEK SOFTCLIX LANCETS  lancets Check blood sugar once daily and as instructed. Dx E11.65 06/18/17   Hannah Beat, MD  amLODipine (NORVASC) 10 MG tablet Take 1 tablet (10 mg total) by mouth daily. PLEASE SCHEDULE OFFICE VISIT FOR FURTHER REFILLS. THANK YOU! 03/04/23   Antonieta Iba, MD  Blood Glucose Monitoring Suppl (ACCU-CHEK AVIVA PLUS) w/Device  KIT Check blood sugar once daily and as instructed. Dx E11.65 06/18/17   Hannah Beat, MD  glucose blood (ACCU-CHEK AVIVA PLUS) test strip Check blood sugar once daily and as instructed. Dx E11.65 08/08/22   Hannah Beat, MD     Family History  Problem Relation Age of Onset   Alcohol abuse Father    Stroke Father    Other Father        cardiac problems   Heart disease Father    COPD Mother    Alcohol abuse Brother    Cirrhosis Brother        EtOH   Anxiety disorder Brother    Heart disease Paternal Grandmother    Colon cancer Neg Hx    Esophageal cancer Neg Hx    Rectal cancer Neg Hx    Stomach cancer Neg Hx     Social History   Socioeconomic History   Marital status: Significant Other    Spouse name: Not on file   Number of children: Not on file   Years of education: Not on file   Highest education level: Not on file  Occupational History   Not on file  Tobacco Use   Smoking status: Former   Smokeless tobacco: Never   Tobacco comments:    quit 41 years ago  Vaping Use   Vaping status: Never Used  Substance and Sexual Activity   Alcohol use: Yes    Alcohol/week: 17.0 standard drinks of alcohol    Types: 2 Glasses of wine, 15 Shots of liquor per week    Comment: 2 glasses of wine or 3-4 shots of bourbon daily   Drug use: No   Sexual activity: Not on file  Other Topics Concern   Not on file  Social History Narrative   Wife Waynetta Sandy is deceased   He is retired.    Enjoys firearms and recreational shooting.    Social Determinants of Health   Financial Resource Strain: Low Risk  (08/08/2022)   Overall Financial Resource Strain (CARDIA)    Difficulty of Paying Living Expenses: Not hard at all  Food Insecurity: No Food Insecurity (03/02/2023)   Hunger Vital Sign    Worried About Running Out of Food in the Last Year: Never true    Ran Out of Food in the Last Year: Never true  Transportation Needs: No Transportation Needs (03/02/2023)   PRAPARE -  Administrator, Civil Service (Medical): No    Lack of Transportation (Non-Medical): No  Physical Activity: Sufficiently Active (08/08/2022)   Exercise Vital Sign    Days of Exercise per Week: 7 days    Minutes of Exercise per Session: 60 min  Stress: No Stress Concern Present (08/08/2022)   [employer] of Occupational Health - Occupational Stress Questionnaire    Feeling of Stress : Not at all  Social Connections: Socially Integrated (08/08/2022)   Social Connection and Isolation Panel [NHANES]    Frequency of Communication with Friends and Family: Twice a week    Frequency of Social Gatherings with Friends and Family: More than three times a week    Attends Religious Services: More than 4  times per year    Active Member of Clubs or Organizations: Yes    Attends Banker Meetings: More than 4 times per year    Marital Status: Living with partner   Code Status: Full code  Review of Systems: A 12 point ROS discussed and pertinent positives are indicated in the HPI above.  All other systems are negative.  Review of Systems  Constitutional:  Positive for chills and fever.  Respiratory:  Negative for chest tightness and shortness of breath.   Cardiovascular:  Negative for chest pain and leg swelling.  Gastrointestinal:  Positive for abdominal pain. Negative for diarrhea, nausea and vomiting.  Neurological:  Negative for dizziness and headaches.  Psychiatric/Behavioral:  Negative for confusion.     Vital Signs: BP (!) 140/78 (BP Location: Right Arm)   Pulse 86   Temp 99.4 F (37.4 C)   Resp 18   Ht 5\' 6"  (1.676 m)   Wt 167 lb 5.3 oz (75.9 kg)   SpO2 96%   BMI 27.01 kg/m   Advance Care Plan: The advanced care plan/surrogate decision maker was discussed at the time of visit and documented in the medical record.  Patient indicated his son, Haskel Dewalt, is Runner, broadcasting/film/video.  Physical Exam Vitals reviewed.  Constitutional:      General: He  is not in acute distress.    Appearance: He is ill-appearing.  Cardiovascular:     Rate and Rhythm: Normal rate and regular rhythm.     Pulses: Normal pulses.     Heart sounds: Murmur heard.  Pulmonary:     Effort: Pulmonary effort is normal.     Breath sounds: Normal breath sounds.  Abdominal:     Palpations: Abdomen is soft.     Tenderness: There is abdominal tenderness. There is no rebound.     Comments: RLQ tenderness present  Musculoskeletal:     Right lower leg: No edema.     Left lower leg: No edema.  Skin:    General: Skin is warm and dry.  Neurological:     Mental Status: He is alert and oriented to person, place, and time.  Psychiatric:        Mood and Affect: Mood normal.        Behavior: Behavior normal.        Thought Content: Thought content normal.        Judgment: Judgment normal.     Imaging: CT ABDOMEN PELVIS W CONTRAST  Result Date: 03/04/2023 CLINICAL DATA:  Right lower quadrant pain. Complicated appendicitis. EXAM: CT ABDOMEN AND PELVIS WITH CONTRAST TECHNIQUE: Multidetector CT imaging of the abdomen and pelvis was performed using the standard protocol following bolus administration of intravenous contrast. RADIATION DOSE REDUCTION: This exam was performed according to the departmental dose-optimization program which includes automated exposure control, adjustment of the mA and/or kV according to patient size and/or use of iterative reconstruction technique. CONTRAST:  OMNIPAQUE IOHEXOL 300 MG/ML  SOLN COMPARISON:  CT abdomen pelvis 03/02/2023 FINDINGS: Lower chest: Trace right pleural fluid versus dependent changes at the right lung base. Hepatobiliary: Calcified gallstones without gallbladder distention or inflammatory changes. Main portal venous system is patent. No acute liver abnormality. Again noted is a small hypodensity in the right hepatic lobe on image 26/2 that is too small to definitively characterize. Pancreas: Unremarkable. No pancreatic ductal  dilatation or surrounding inflammatory changes. Spleen: Normal in size without focal abnormality. Adrenals/Urinary Tract: Normal adrenal glands. No hydronephrosis. Moderate distention of the  urinary bladder. Tiny hypodensity in the right kidney lower pole is too small to definitively characterize and does require dedicated follow-up. No suspicious renal lesions. Stomach/Bowel: Again noted are severe inflammatory changes in the right abdomen centered around an abnormal appendix. Evidence for multiple appendicoliths. The proximal aspect of the appendix remains distended measuring roughly 1.8 cm previously measured 1.5 cm. There is persistent and probably increased wall thickening involving the terminal ileum adjacent to the appendix inflammation. Diffuse wall thickening of the right colon. Moderate amount of stool involving the sigmoid colon and rectum. Probable small hiatal hernia. Increased edema and stranding around the proximal small bowel near the ligament of Treitz. Oral contrast in the small bowel and right colon. No evidence for bowel obstruction. Vascular/Lymphatic: Atherosclerotic disease involving the abdominal aorta without aneurysm. Overall, there is not significant lymph node enlargement in the abdomen or pelvis. Again noted is a poorly defined hypodensity in the left periaortic space measuring 1.2 cm and minimally changed from the previous examination. Main portal venous system is patent. Reproductive: Prostate is unremarkable. Other: Increased edema and stranding throughout the abdomen. The fluid collection just posterior and caudal to the appendix has enlarged in size. This collection now measures 4.8 2.6 x 3.2 cm. This is most compatible with a periappendiceal abscess. Again noted is a is small fluid collection adjacent to the terminal ileum on image 52/2. This collection measures up to 2.1 cm and previously measured up to 1.5 cm. New fluid adjacent to the left colon in the left paracolic gutter. No  evidence for free air or extraluminal gas collections. Musculoskeletal: No acute bone abnormality. IMPRESSION: 1. Worsening inflammatory changes in the right abdomen centered around an abnormal appendix. Findings are compatible with acute appendicitis with perforation. The appendix has increased in size and there is increased wall thickening involving the terminal ileum and right colon. 2. Increased size of the fluid collection adjacent to the appendix and the fluid collection adjacent to the terminal ileum. Findings are compatible with abscesses. 3. Increased edema and stranding throughout the abdomen. 4. Cholelithiasis without evidence for acute cholecystitis. 5. Trace right pleural fluid versus dependent changes at the right lung base. 6. Aortic Atherosclerosis (ICD10-I70.0). Electronically Signed   By: Richarda Overlie M.D.   On: 03/04/2023 10:14   CT ABDOMEN PELVIS W CONTRAST  Result Date: 03/02/2023 CLINICAL DATA:  Right lower quadrant abdominal pain. EXAM: CT ABDOMEN AND PELVIS WITH CONTRAST TECHNIQUE: Multidetector CT imaging of the abdomen and pelvis was performed using the standard protocol following bolus administration of intravenous contrast. RADIATION DOSE REDUCTION: This exam was performed according to the departmental dose-optimization program which includes automated exposure control, adjustment of the mA and/or kV according to patient size and/or use of iterative reconstruction technique. CONTRAST:  75mL OMNIPAQUE IOHEXOL 300 MG/ML  SOLN COMPARISON:  None Available. FINDINGS: Lower chest: Prior median sternotomy.  Lung bases are clear. Hepatobiliary: Cholelithiasis without gallbladder distension or inflammatory changes. Normal appearance of the liver. Tiny hypodensity in the right hepatic lobe on image 26/2 is likely an incidental finding. No biliary dilatation. Pancreas: Unremarkable. No pancreatic ductal dilatation or surrounding inflammatory changes. Spleen: Normal in size without focal  abnormality. Adrenals/Urinary Tract: Normal adrenal glands. Tiny hypodensity in the right kidney lower pole is too small to definitively characterize and does not require dedicated follow-up. No hydronephrosis. No suspicious renal lesions. Urinary bladder has mild wall thickening with moderate distention. Stomach/Bowel: Severe inflammatory changes centered around an abnormal appendix. Appendix has multiple appendicoliths. Proximal/mid aspect of the  appendix measures up to 1.5 cm. Small amount of fluid along the posterior caudal aspect of the appendix on image 54/2. Findings compatible with a perforated appendicitis. Extensive inflammatory changes in the right abdomen with wall thickening involving the terminal ileum likely secondary to the appendix inflammation. Focal fluid just posterior to the terminal ileum on image 51/2 measures 1.5 cm. Normal appearance of the stomach. Vascular/Lymphatic: Coronary artery calcifications. Atherosclerotic calcifications in the abdominal aorta without aneurysm. Splenic artery calcifications. Round low-density structure in the left periaortic region measures 1.3 cm in the short axis on image 29/2. Not clear if this represents enlarged lymph node or fluid-filled structure. Overall, there is not significant lymph node enlargement in the abdomen or pelvis. Reproductive: Prostate has calcifications. Other: Trace free fluid in the pelvis. Extensive inflammatory changes in the right lower quadrant centered around the appendix inflammation. No evidence for extraluminal gas. Musculoskeletal: No acute bone abnormality. IMPRESSION: 1. Severe inflammatory changes centered around an abnormal appendix. Findings are compatible with a perforated appendicitis. Small amount of fluid around the appendix and terminal ileum. Findings could represent developing abscess collections. These collections are too small for percutaneous drainage at this time. 2. Extensive inflammatory changes in the right  abdomen with wall thickening involving the terminal ileum. Findings are likely secondary to the appendix inflammation. 3. Cholelithiasis without evidence for acute cholecystitis. 4. Mild urinary bladder wall thickening. Correlate with urinalysis. Findings could be related to chronic bladder outlet obstruction. 5. Coronary artery calcifications. 6. Aortic Atherosclerosis (ICD10-I70.0). These results were called by telephone at the time of interpretation on 03/02/2023 at 1:31 pm to provider Willy Eddy , who verbally acknowledged these results. Electronically Signed   By: Richarda Overlie M.D.   On: 03/02/2023 13:32    Labs:  CBC: Recent Labs    02/12/23 0826 03/02/23 1153 03/03/23 0348 03/04/23 0530  WBC 6.4 12.0* 11.0* 11.3*  HGB 14.5 15.0 13.3 14.2  HCT 42.1 43.3 36.6* 39.9  PLT 180.0 148* 136* 153    COAGS: No results for input(s): "INR", "APTT" in the last 8760 hours.  BMP: Recent Labs    02/12/23 0826 03/02/23 1153 03/03/23 0348 03/04/23 0530  NA 139 133* 133* 134*  K 4.5 4.0 3.6 3.8  CL 102 100 103 105  CO2 27 18* 23 22  GLUCOSE 158* 230* 232* 219*  BUN 21 24* 22 12  CALCIUM 10.2 9.3 8.4* 8.3*  CREATININE 1.19 1.38* 1.26* 1.05  GFRNONAA  --  53* 59* >60    LIVER FUNCTION TESTS: Recent Labs    08/14/22 0849 02/12/23 0826 03/02/23 1153  BILITOT 0.8 0.9 2.3*  AST 19 21 29   ALT 25 27 39  ALKPHOS 37* 37* 47  PROT 7.2 7.0 7.3  ALBUMIN 4.6 4.5 4.0    TUMOR MARKERS: No results for input(s): "AFPTM", "CEA", "CA199", "CHROMGRNA" in the last 8760 hours.  Assessment and Plan:  Esten Dollar is a 77 yo male being seen today in relation to perforated appendicitis with RLQ fluid collection. Patient was admitted to surgical service on 03/02/23, and imaging from 03/04/23 revealed concern for RLQ abscess formation. IR was consulted on 03/04/23 to evaluate patient for possible drain placement. Imaging was reviewed by Dr Juliette Alcide and approved for image-guided drain placement on  03/04/23. Patient is NPO.  Risks and benefits discussed with the patient including bleeding, infection, damage to adjacent structures, bowel perforation/fistula connection, and sepsis.  All of the patient's questions were answered, patient is agreeable to proceed. Consent signed and  in chart.   Thank you for this interesting consult.  I greatly enjoyed meeting MARQUIST BINSTOCK and look forward to participating in their care.  A copy of this report was sent to the requesting provider on this date.  Electronically Signed: Kennieth Francois, PA-C 03/04/2023, 1:52 PM   I spent a total of 20 Minutes in face to face in clinical consultation, greater than 50% of which was counseling/coordinating care for perforated appendicitis.

## 2023-03-04 NOTE — Progress Notes (Signed)
Patient clinically stable post CT Abscess 12 FR drain placement per Dr Juliette Alcide, tolerated well. Vitals stable post procedure. Received Versed 1 mg along with Fentanyl 100 mcg IV for procedure. Report given to Weldon Picking Rn post procedure/11/specials.

## 2023-03-04 NOTE — Telephone Encounter (Signed)
Please schedule F/U appointment for 90 day refills. It looks like patient is hospitalized at this time. Will give 30 day supply until scheduled. Thank you!

## 2023-03-04 NOTE — Progress Notes (Signed)
Patient received from specials awake and verbal with JP drain to RLQ draining brown drainage. No acute distress noted.  Cornell Barman Kaeo Jacome

## 2023-03-05 ENCOUNTER — Other Ambulatory Visit: Payer: Self-pay

## 2023-03-05 DIAGNOSIS — K3533 Acute appendicitis with perforation and localized peritonitis, with abscess: Secondary | ICD-10-CM | POA: Diagnosis not present

## 2023-03-05 LAB — BASIC METABOLIC PANEL
Anion gap: 12 (ref 5–15)
BUN: 9 mg/dL (ref 8–23)
CO2: 20 mmol/L — ABNORMAL LOW (ref 22–32)
Calcium: 7.8 mg/dL — ABNORMAL LOW (ref 8.9–10.3)
Chloride: 103 mmol/L (ref 98–111)
Creatinine, Ser: 1.05 mg/dL (ref 0.61–1.24)
GFR, Estimated: >60 mL/min (ref >60)
Glucose, Bld: 162 mg/dL — ABNORMAL HIGH (ref 70–99)
Potassium: 3.6 mmol/L (ref 3.5–5.1)
Sodium: 135 mmol/L (ref 135–145)

## 2023-03-05 LAB — CBC
HCT: 35.3 % — ABNORMAL LOW (ref 39.0–52.0)
Hemoglobin: 12.7 g/dL — ABNORMAL LOW (ref 13.0–17.0)
MCH: 31.7 pg (ref 26.0–34.0)
MCHC: 36 g/dL (ref 30.0–36.0)
MCV: 88 fL (ref 80.0–100.0)
Platelets: 162 10*3/uL (ref 150–400)
RBC: 4.01 MIL/uL — ABNORMAL LOW (ref 4.22–5.81)
RDW: 11.9 % (ref 11.5–15.5)
WBC: 8.4 10*3/uL (ref 4.0–10.5)
nRBC: 0 % (ref 0.0–0.2)

## 2023-03-05 LAB — GLUCOSE, CAPILLARY
Glucose-Capillary: 159 mg/dL — ABNORMAL HIGH (ref 70–99)
Glucose-Capillary: 172 mg/dL — ABNORMAL HIGH (ref 70–99)
Glucose-Capillary: 158 mg/dL — ABNORMAL HIGH (ref 70–99)
Glucose-Capillary: 140 mg/dL — ABNORMAL HIGH (ref 70–99)

## 2023-03-05 MED ORDER — SODIUM CHLORIDE FLUSH 0.9 % IV SOLN
5.0000 mL | Freq: Every day | INTRAVENOUS | 2 refills | Status: DC
  Filled 2023-03-05: qty 100, 20d supply, fill #0

## 2023-03-05 NOTE — Progress Notes (Addendum)
Mobility Specialist - Progress Note  03/05/23 1545  Mobility  Activity Ambulated with assistance in hallway;Ambulated with assistance in room  Level of Assistance Standby assist, set-up cues, supervision of patient - no hands on  Assistive Device None  Distance Ambulated (ft) 200 ft  Activity Response Tolerated well  $Mobility charge 1 Mobility  Mobility Specialist Start Time (ACUTE ONLY) 1520  Mobility Specialist Stop Time (ACUTE ONLY) 1538  Mobility Specialist Time Calculation (min) (ACUTE ONLY) 18 min   Pt supine upon entry, utilizing RA. Pt expresses R-side lower abd pain due to the procedure, agreeable to OOB amb in the hall this date. Pt completed bed mob and STS from EOB indep. Pt amb one lap around 2C NS CGA-SBA, occasionally holding onto the railing w/ right hand-- some R-sided unsteadiness present when railing became unavailable. Pt returned to the room, left supine with alarm set and needs within reach. RN notified.  Zetta Bills Mobility Specialist 03/05/23 3:54 PM

## 2023-03-05 NOTE — Progress Notes (Signed)
Referring Physician(s): Lynden Oxford, PA-C  Supervising Physician: Richarda Overlie  Patient Status:  [facility] - In-pt  Chief Complaint:  Perforated appendicitis s/p RLQ drain placement 03/04/23 by Dr Juliette Alcide  Subjective: Patient lying in bed at time of exam. He states that he was very nauseous this morning and vomited, but that he is feeling somewhat better at time of exam. He endorses mild pain at drain insertion site, but states it is manageable.    Allergies: Sulfonamide derivatives  Medications: Prior to Admission medications   Medication Sig Start Date End Date Taking? Authorizing Provider  aspirin 81 MG tablet Take 1 tablet (81 mg total) by mouth daily. 06/11/17  Yes Gollan, Tollie Pizza, MD  atorvastatin (LIPITOR) 80 MG tablet TAKE 1 TABLET(80 MG) BY MOUTH DAILY Patient taking differently: Take 80 mg by mouth daily. TAKE 1 TABLET(80 MG) BY MOUTH DAILY 09/16/22  Yes Copland, Karleen Hampshire, MD  calcium carbonate (TUMS - DOSED IN MG ELEMENTAL CALCIUM) 500 MG chewable tablet Chew 1 tablet by mouth daily as needed for indigestion or heartburn.   Yes [provider]  doxazosin (CARDURA) 1 MG tablet TAKE 1 TABLET(1 MG) BY MOUTH DAILY Patient taking differently: Take 1 mg by mouth daily. 02/25/23  Yes Gollan, Tollie Pizza, MD  fish oil-omega-3 fatty acids 1000 MG capsule Take 2 g by mouth daily.   Yes [provider]  losartan (COZAAR) 100 MG tablet TAKE 1 TABLET(100 MG) BY MOUTH DAILY Patient taking differently: Take 100 mg by mouth daily. TAKE 1 TABLET(100 MG) BY MOUTH DAILY 01/09/23  Yes Copland, Karleen Hampshire, MD  metFORMIN (GLUCOPHAGE-XR) 500 MG 24 hr tablet TAKE 4 TABLETS BY MOUTH EVERY DAY WITH BREAKFAST Patient taking differently: Take 500 mg by mouth daily with breakfast. TAKE 4 TABLETS BY MOUTH EVERY DAY WITH BREAKFAST 10/01/22  Yes Copland, Karleen Hampshire, MD  metoprolol succinate (TOPROL-XL) 50 MG 24 hr tablet Take 1 tablet (50 mg total) by mouth daily. PLEASE SCHEDULE OFFICE VISIT FOR  FURTHER REFILLS. THANK YOU! 02/07/23  Yes Antonieta Iba, MD  Multiple Vitamin (MULTIVITAMIN) tablet Take 1 tablet by mouth daily.   Yes [provider]  ACCU-CHEK SOFTCLIX LANCETS lancets Check blood sugar once daily and as instructed. Dx E11.65 06/18/17   Hannah Beat, MD  amLODipine (NORVASC) 10 MG tablet Take 1 tablet (10 mg total) by mouth daily. PLEASE SCHEDULE OFFICE VISIT FOR FURTHER REFILLS. THANK YOU! 03/04/23   Antonieta Iba, MD  Blood Glucose Monitoring Suppl (ACCU-CHEK AVIVA PLUS) w/Device KIT Check blood sugar once daily and as instructed. Dx E11.65 06/18/17   Hannah Beat, MD  glucose blood (ACCU-CHEK AVIVA PLUS) test strip Check blood sugar once daily and as instructed. Dx E11.65 08/08/22   Hannah Beat, MD     Vital Signs: BP (!) 146/84 (BP Location: Right Arm)   Pulse 72   Temp 98 F (36.7 C) (Oral)   Resp 18   Ht 5\' 6"  (1.676 m)   Wt 167 lb 5.3 oz (75.9 kg)   SpO2 96%   BMI 27.01 kg/m   Physical Exam Drain Location: RLQ Size: Fr size: 12 Fr Date of placement: 03/04/23  Currently to: Drain collection device: suction bulb 24 hour output:  Output by Drain (mL) 03/03/23 0701 - 03/03/23 1900 03/03/23 1901 - 03/04/23 0700 03/04/23 0701 - 03/04/23 1900 03/04/23 1901 - 03/05/23 0700 03/05/23 0701 - 03/05/23 1443  Closed System Drain Lateral RLQ    30     Interval imaging/drain manipulation:  None  Current examination: Flushes/aspirates easily.  Insertion site unremarkable. Suture and stat lock in place. Dressed appropriately.  ~10-15 mL of serous-appearing fluid in drain at time of exam. Small amount of purulent debris aspirated when flushed/aspirated patient's drain.   Imaging: CT GUIDED PERITONEAL/RETROPERITONEAL FLUID DRAIN BY PERC CATH  Result Date: 03/05/2023 INDICATION: Perforated appendicitis EXAM: CT-guided intra-abdominal drain placement TECHNIQUE: Multidetector CT imaging of the abdomen and pelvis was performed following the  standard protocol without IV contrast. RADIATION DOSE REDUCTION: This exam was performed according to the departmental dose-optimization program which includes automated exposure control, adjustment of the mA and/or kV according to patient size and/or use of iterative reconstruction technique. MEDICATIONS: Documented in the EMR ANESTHESIA/SEDATION: Moderate (conscious) sedation was employed during this procedure. A total of Versed 1 mg and Fentanyl 100 mcg was administered intravenously by the radiology nurse. Total intra-service moderate Sedation Time: 12 minutes. The patient's level of consciousness and vital signs were monitored continuously by radiology nursing throughout the procedure under my direct supervision. COMPLICATIONS: None immediate. PROCEDURE: Informed written consent was obtained from the patient after a thorough discussion of the procedural risks, benefits and alternatives. All questions were addressed. Maximal Sterile Barrier Technique was utilized including caps, mask, sterile gowns, sterile gloves, sterile drape, hand hygiene and skin antiseptic. A timeout was performed prior to the initiation of the procedure. The patient was placed supine on the exam table. Limited CT of the abdomen and pelvis was performed for planning purposes. This demonstrated fluid collection in the right lower quadrant. Skin entry site was marked, and the overlying skin was prepped and draped in the standard sterile fashion. Local analgesia was obtained with 1% lidocaine. Using intermittent CT fluoroscopy, a 19 gauge Yueh catheter was advanced towards the identified fluid collection in the right lower quadrant. Location was confirmed with CT and return of purulent material. Over an Amplatz wire, the percutaneous tract was serially dilated to accommodate a 12 French locking drainage catheter. Locking loop was formed, and location was again confirmed with CT and return of additional purulent material. A sample was  collected and sent to the lab for microbiology analysis. The drainage catheter was secured to the skin using silk suture and a dressing. It was attached to bulb suction. The patient tolerated the procedure well without immediate complication. IMPRESSION: Successful CT-guided placement of a 12 French locking drainage catheter into the right lower quadrant fluid collection, with return of purulent material. A sample was sent to the lab for microbiology analysis. Electronically Signed   By: Olive Bass M.D.   On: 03/05/2023 11:46   CT ABDOMEN PELVIS W CONTRAST  Result Date: 03/04/2023 CLINICAL DATA:  Right lower quadrant pain. Complicated appendicitis. EXAM: CT ABDOMEN AND PELVIS WITH CONTRAST TECHNIQUE: Multidetector CT imaging of the abdomen and pelvis was performed using the standard protocol following bolus administration of intravenous contrast. RADIATION DOSE REDUCTION: This exam was performed according to the departmental dose-optimization program which includes automated exposure control, adjustment of the mA and/or kV according to patient size and/or use of iterative reconstruction technique. CONTRAST:  OMNIPAQUE IOHEXOL 300 MG/ML  SOLN COMPARISON:  CT abdomen pelvis 03/02/2023 FINDINGS: Lower chest: Trace right pleural fluid versus dependent changes at the right lung base. Hepatobiliary: Calcified gallstones without gallbladder distention or inflammatory changes. Main portal venous system is patent. No acute liver abnormality. Again noted is a small hypodensity in the right hepatic lobe on image 26/2 that is too small to definitively characterize. Pancreas: Unremarkable. No pancreatic ductal dilatation or  surrounding inflammatory changes. Spleen: Normal in size without focal abnormality. Adrenals/Urinary Tract: Normal adrenal glands. No hydronephrosis. Moderate distention of the urinary bladder. Tiny hypodensity in the right kidney lower pole is too small to definitively characterize and does  require dedicated follow-up. No suspicious renal lesions. Stomach/Bowel: Again noted are severe inflammatory changes in the right abdomen centered around an abnormal appendix. Evidence for multiple appendicoliths. The proximal aspect of the appendix remains distended measuring roughly 1.8 cm previously measured 1.5 cm. There is persistent and probably increased wall thickening involving the terminal ileum adjacent to the appendix inflammation. Diffuse wall thickening of the right colon. Moderate amount of stool involving the sigmoid colon and rectum. Probable small hiatal hernia. Increased edema and stranding around the proximal small bowel near the ligament of Treitz. Oral contrast in the small bowel and right colon. No evidence for bowel obstruction. Vascular/Lymphatic: Atherosclerotic disease involving the abdominal aorta without aneurysm. Overall, there is not significant lymph node enlargement in the abdomen or pelvis. Again noted is a poorly defined hypodensity in the left periaortic space measuring 1.2 cm and minimally changed from the previous examination. Main portal venous system is patent. Reproductive: Prostate is unremarkable. Other: Increased edema and stranding throughout the abdomen. The fluid collection just posterior and caudal to the appendix has enlarged in size. This collection now measures 4.8 2.6 x 3.2 cm. This is most compatible with a periappendiceal abscess. Again noted is a is small fluid collection adjacent to the terminal ileum on image 52/2. This collection measures up to 2.1 cm and previously measured up to 1.5 cm. New fluid adjacent to the left colon in the left paracolic gutter. No evidence for free air or extraluminal gas collections. Musculoskeletal: No acute bone abnormality. IMPRESSION: 1. Worsening inflammatory changes in the right abdomen centered around an abnormal appendix. Findings are compatible with acute appendicitis with perforation. The appendix has increased in size  and there is increased wall thickening involving the terminal ileum and right colon. 2. Increased size of the fluid collection adjacent to the appendix and the fluid collection adjacent to the terminal ileum. Findings are compatible with abscesses. 3. Increased edema and stranding throughout the abdomen. 4. Cholelithiasis without evidence for acute cholecystitis. 5. Trace right pleural fluid versus dependent changes at the right lung base. 6. Aortic Atherosclerosis (ICD10-I70.0). Electronically Signed   By: Richarda Overlie M.D.   On: 03/04/2023 10:14   CT ABDOMEN PELVIS W CONTRAST  Result Date: 03/02/2023 CLINICAL DATA:  Right lower quadrant abdominal pain. EXAM: CT ABDOMEN AND PELVIS WITH CONTRAST TECHNIQUE: Multidetector CT imaging of the abdomen and pelvis was performed using the standard protocol following bolus administration of intravenous contrast. RADIATION DOSE REDUCTION: This exam was performed according to the departmental dose-optimization program which includes automated exposure control, adjustment of the mA and/or kV according to patient size and/or use of iterative reconstruction technique. CONTRAST:  75mL OMNIPAQUE IOHEXOL 300 MG/ML  SOLN COMPARISON:  None Available. FINDINGS: Lower chest: Prior median sternotomy.  Lung bases are clear. Hepatobiliary: Cholelithiasis without gallbladder distension or inflammatory changes. Normal appearance of the liver. Tiny hypodensity in the right hepatic lobe on image 26/2 is likely an incidental finding. No biliary dilatation. Pancreas: Unremarkable. No pancreatic ductal dilatation or surrounding inflammatory changes. Spleen: Normal in size without focal abnormality. Adrenals/Urinary Tract: Normal adrenal glands. Tiny hypodensity in the right kidney lower pole is too small to definitively characterize and does not require dedicated follow-up. No hydronephrosis. No suspicious renal lesions. Urinary bladder has mild wall  thickening with moderate distention.  Stomach/Bowel: Severe inflammatory changes centered around an abnormal appendix. Appendix has multiple appendicoliths. Proximal/mid aspect of the appendix measures up to 1.5 cm. Small amount of fluid along the posterior caudal aspect of the appendix on image 54/2. Findings compatible with a perforated appendicitis. Extensive inflammatory changes in the right abdomen with wall thickening involving the terminal ileum likely secondary to the appendix inflammation. Focal fluid just posterior to the terminal ileum on image 51/2 measures 1.5 cm. Normal appearance of the stomach. Vascular/Lymphatic: Coronary artery calcifications. Atherosclerotic calcifications in the abdominal aorta without aneurysm. Splenic artery calcifications. Round low-density structure in the left periaortic region measures 1.3 cm in the short axis on image 29/2. Not clear if this represents enlarged lymph node or fluid-filled structure. Overall, there is not significant lymph node enlargement in the abdomen or pelvis. Reproductive: Prostate has calcifications. Other: Trace free fluid in the pelvis. Extensive inflammatory changes in the right lower quadrant centered around the appendix inflammation. No evidence for extraluminal gas. Musculoskeletal: No acute bone abnormality. IMPRESSION: 1. Severe inflammatory changes centered around an abnormal appendix. Findings are compatible with a perforated appendicitis. Small amount of fluid around the appendix and terminal ileum. Findings could represent developing abscess collections. These collections are too small for percutaneous drainage at this time. 2. Extensive inflammatory changes in the right abdomen with wall thickening involving the terminal ileum. Findings are likely secondary to the appendix inflammation. 3. Cholelithiasis without evidence for acute cholecystitis. 4. Mild urinary bladder wall thickening. Correlate with urinalysis. Findings could be related to chronic bladder outlet obstruction.  5. Coronary artery calcifications. 6. Aortic Atherosclerosis (ICD10-I70.0). These results were called by telephone at the time of interpretation on 03/02/2023 at 1:31 pm to provider Willy Eddy , who verbally acknowledged these results. Electronically Signed   By: Richarda Overlie M.D.   On: 03/02/2023 13:32    Labs:  CBC: Recent Labs    03/02/23 1153 03/03/23 0348 03/04/23 0530 03/05/23 0630  WBC 12.0* 11.0* 11.3* 8.4  HGB 15.0 13.3 14.2 12.7*  HCT 43.3 36.6* 39.9 35.3*  PLT 148* 136* 153 162    COAGS: No results for input(s): "INR", "APTT" in the last 8760 hours.  BMP: Recent Labs    03/02/23 1153 03/03/23 0348 03/04/23 0530 03/05/23 0630  NA 133* 133* 134* 135  K 4.0 3.6 3.8 3.6  CL 100 103 105 103  CO2 18* 23 22 20*  GLUCOSE 230* 232* 219* 162*  BUN 24* 22 12 9   CALCIUM 9.3 8.4* 8.3* 7.8*  CREATININE 1.38* 1.26* 1.05 1.05  GFRNONAA 53* 59* >60 >60    LIVER FUNCTION TESTS: Recent Labs    08/14/22 0849 02/12/23 0826 03/02/23 1153  BILITOT 0.8 0.9 2.3*  AST 19 21 29   ALT 25 27 39  ALKPHOS 37* 37* 47  PROT 7.2 7.0 7.3  ALBUMIN 4.6 4.5 4.0    Assessment and Plan:  Perforated appendicitis s/p RLQ drain placement 03/04/23 -Patient tolerating drain well, has minor pain at drain insertion site -WBC 8.4 today (11.3) -Hgb 12.7 (14.2) -Gram stain revealed abundant gram positive cocci and abundant gram negative rods. Culture is pending -Patient educated on drain care and flushing  Plan: Continue TID flushes with 5 cc NS. Record output Q shift. Dressing changes QD or PRN if soiled.  Call IR APP or on call IR MD if difficulty flushing or sudden change in drain output.  Repeat imaging/possible drain injection once output < 10 mL/QD (excluding flush material). Consideration  for drain removal if output is < 10 mL/QD (excluding flush material), pending discussion with the providing surgical service.  Discharge planning: Order placed for patient to follow up with IR  clinic 10-14 days post d/c for repeat imaging/possible drain injection. IR scheduler will contact patient with date/time of appointment. Patient will need to flush drain QD with 5 cc NS, record output QD, dressing changes every 2-3 days or earlier if soiled.   IR will continue to follow - please call with questions or concerns.     Electronically Signed: Kennieth Francois, PA-C 03/05/2023, 12:43 PM   I spent a total of 15 Minutes at the the patient's bedside AND on the patient's hospital floor or unit, greater than 50% of which was counseling/coordinating care for perforated appendicitis s/p drain placement 03/05/23

## 2023-03-05 NOTE — TOC Progression Note (Signed)
Transition of Care [facility]) - Progression Note    Patient Details  Name: [name] MRN: [id number] Date of Birth: October 26, 1945  Transition of [facility]) CM/SW Contact  Truddie Hidden, RN Phone Number: 03/05/2023, 10:40 AM  Clinical Narrative:    TOC assessing for ongoing needs and discharge planning.        Expected Discharge Plan and Services                                               Social Determinants of Health (SDOH) Interventions SDOH Screenings   Food Insecurity: No Food Insecurity (03/02/2023)  Housing: Low Risk  (03/02/2023)  Transportation Needs: No Transportation Needs (03/02/2023)  Utilities: Not At Risk (03/02/2023)  Alcohol Screen: Low Risk  (08/02/2021)  Depression (PHQ2-9): Low Risk  (12/18/2022)  Financial Resource Strain: Low Risk  (08/08/2022)  Physical Activity: Sufficiently Active (08/08/2022)  Social Connections: Socially Integrated (08/08/2022)  Stress: No Stress Concern Present (08/08/2022)  Tobacco Use: Medium Risk (03/02/2023)    Readmission Risk Interventions     No data to display

## 2023-03-05 NOTE — Progress Notes (Cosign Needed Addendum)
[facility] SURGICAL PROGRESS NOTE (cpt [phone])  Hospital Day(s): 3.  Interval History: Patient seen and examined, no acute events or new complaints overnight. Patient reports he is feeling much better this morning. Abdominal pain localized to drain site in RLQ. No nausea or emesis. No fever in last 24 hours. Leukocytosis now resolved; 8.4K. Hgb to 12.7. Renal function normal; sCr - 1.05; UO - unmeasured. Underwent percutaneous drain placement on 07/15; output serous and 30 ccs. Cx from this growing GPC and GNR. He is on Zosyn. He is NPO  Review of Systems:  Constitutional: denied fever, denied chills   HEENT: denies cough or congestion  Respiratory: denies any shortness of breath  Cardiovascular: denies chest pain or palpitations  Gastrointestinal: + abdominal pain (improved; at drain site), denied N/V Genitourinary: denies burning with urination or urinary frequency Musculoskeletal: denies pain, decreased motor or sensation  Vital signs in last 24 hours: [min-max] current  Temp:  [97.8 F (36.6 C)-99.4 F (37.4 C)] 98.3 F (36.8 C) (07/16 0554) Pulse Rate:  [78-94] 84 (07/16 0554) Resp:  [18-23] 18 (07/16 0554) BP: (119-150)/(67-81) 150/81 (07/16 0554) SpO2:  [92 %-98 %] 97 % (07/16 0554)     Height: 5\' 6"  (167.6 cm) Weight: 75.9 kg BMI (Calculated): 27.02   Intake/Output last 2 shifts:  07/15 0701 - 07/16 0700 In: 0  Out: 30 [Drains:30]   Physical Exam:  Constitutional: alert, cooperative and no distress  HENT: normocephalic without obvious abnormality  Eyes: PERRL, EOM's grossly intact and symmetric  Respiratory: breathing non-labored at rest  Cardiovascular: regular rate and sinus rhythm  Gastrointestinal: Abdomen is soft, he is tender over drain site, still distended and tympanic, no rebound/guarding. He is not overtly peritonitic. Drain in RLQ; serous  Musculoskeletal: no edema or wounds, motor and sensation grossly intact, NT    Labs:     Latest Ref  Rng & Units 03/05/2023    6:30 AM 03/04/2023    5:30 AM 03/03/2023    3:48 AM  CBC  WBC 4.0 - 10.5 K/uL 8.4  11.3  11.0   Hemoglobin 13.0 - 17.0 g/dL 19.1  47.8  29.5   Hematocrit 39.0 - 52.0 % 35.3  39.9  36.6   Platelets 150 - 400 K/uL 162  153  136       Latest Ref Rng & Units 03/05/2023    6:30 AM 03/04/2023    5:30 AM 03/03/2023    3:48 AM  CMP  Glucose 70 - 99 mg/dL 621  308  657   BUN 8 - 23 mg/dL 9  12  22    Creatinine 0.61 - 1.24 mg/dL 8.46  9.62  9.52   Sodium 135 - 145 mmol/L 135  134  133   Potassium 3.5 - 5.1 mmol/L 3.6  3.8  3.6   Chloride 98 - 111 mmol/L 103  105  103   CO2 22 - 32 mmol/L 20  22  23    Calcium 8.9 - 10.3 mg/dL 7.8  8.3  8.4      Imaging studies:  No new pertinent imaging studies   Assessment/Plan: (ICD-10's: K35.32) 77 y.o. male with perforated appendicitis with developing abscess, complicated by DM2 with hyperglycemia    - Doing much better this AM; will trial CLD. He is still distended, may have some degree of ileus from infection, monitor tolerance   - IV Abx (Zosyn); follow up Cx  - Appreciate IR assistance; continue percutaneous drain; monitor and reocrd output; flush per  orders   - No emergent surgical intervention. He understands we will continue with conservative measures in an effort to avoid more extensive surgical resection in this setting. He will ultimately need interval appendectomy in ~8 weeks once recovered from this insult.   - Monitor abdominal examination; on-going bowel function  - Pain control prn; antiemetics prn  - Monitor fever curve; no fever in last 24 hours  - Monitor leukocytosis; resolved - SSI; check HgbA1c  - Mobilize as tolerated    All of the above findings and recommendations were discussed with the patient, and the medical team, and all of patient's questions were answered to his expressed satisfaction.  -- Lynden Oxford, PA-C [facility] 03/05/2023, 7:28 AM M-F: 7am - 4pm

## 2023-03-06 DIAGNOSIS — K3533 Acute appendicitis with perforation and localized peritonitis, with abscess: Secondary | ICD-10-CM | POA: Diagnosis not present

## 2023-03-06 LAB — BASIC METABOLIC PANEL
Anion gap: 10 (ref 5–15)
BUN: 9 mg/dL (ref 8–23)
CO2: 20 mmol/L — ABNORMAL LOW (ref 22–32)
Calcium: 7.9 mg/dL — ABNORMAL LOW (ref 8.9–10.3)
Chloride: 105 mmol/L (ref 98–111)
Creatinine, Ser: 0.98 mg/dL (ref 0.61–1.24)
GFR, Estimated: >60 mL/min (ref >60)
Glucose, Bld: 186 mg/dL — ABNORMAL HIGH (ref 70–99)
Potassium: 3.8 mmol/L (ref 3.5–5.1)
Sodium: 135 mmol/L (ref 135–145)

## 2023-03-06 LAB — CBC
HCT: 35.3 % — ABNORMAL LOW (ref 39.0–52.0)
Hemoglobin: 12.3 g/dL — ABNORMAL LOW (ref 13.0–17.0)
MCH: 31.3 pg (ref 26.0–34.0)
MCHC: 34.8 g/dL (ref 30.0–36.0)
MCV: 89.8 fL (ref 80.0–100.0)
Platelets: 203 10*3/uL (ref 150–400)
RBC: 3.93 MIL/uL — ABNORMAL LOW (ref 4.22–5.81)
RDW: 11.7 % (ref 11.5–15.5)
WBC: 8.7 10*3/uL (ref 4.0–10.5)
nRBC: 0 % (ref 0.0–0.2)

## 2023-03-06 LAB — GLUCOSE, CAPILLARY
Glucose-Capillary: 172 mg/dL — ABNORMAL HIGH (ref 70–99)
Glucose-Capillary: 234 mg/dL — ABNORMAL HIGH (ref 70–99)
Glucose-Capillary: 194 mg/dL — ABNORMAL HIGH (ref 70–99)
Glucose-Capillary: 200 mg/dL — ABNORMAL HIGH (ref 70–99)

## 2023-03-06 LAB — HEMOGLOBIN A1C
Hgb A1c MFr Bld: 7.4 % — ABNORMAL HIGH (ref 4.8–5.6)
Mean Plasma Glucose: 165.68 mg/dL

## 2023-03-06 LAB — ANAEROBIC CULTURE W GRAM STAIN

## 2023-03-06 MED ORDER — POLYETHYLENE GLYCOL 3350 17 G PO PACK
17.0000 g | PACK | Freq: Every day | ORAL | Status: DC
  Administered 2023-03-06 – 2023-03-07 (×2): 17 g via ORAL
  Filled 2023-03-06 (×2): qty 1

## 2023-03-06 NOTE — Care Management Important Message (Signed)
Important Message  Patient Details  Name: [name] MRN: [id number] Date of Birth: May 14, 1946   Medicare Important Message Given:  N/A - LOS <3 / Initial given by admissions     Olegario Messier A Dejanira Pamintuan 03/06/2023, 8:41 AM

## 2023-03-06 NOTE — Plan of Care (Signed)
  Problem: Education: Goal: Ability to describe self-care measures that may prevent or decrease complications (Diabetes Survival Skills Education) will improve Outcome: Progressing Goal: Individualized Educational Video(s) Outcome: Progressing   Problem: Coping: Goal: Ability to adjust to condition or change in health will improve Outcome: Progressing   Problem: Fluid Volume: Goal: Ability to maintain a balanced intake and output will improve Outcome: Progressing   Problem: Nutritional: Goal: Maintenance of adequate nutrition will improve Outcome: Progressing Goal: Progress toward achieving an optimal weight will improve Outcome: Progressing   Problem: Metabolic: Goal: Ability to maintain appropriate glucose levels will improve Outcome: Progressing   Problem: Skin Integrity: Goal: Risk for impaired skin integrity will decrease Outcome: Progressing   Problem: Education: Goal: Knowledge of General Education information will improve Description: Including pain rating scale, medication(s)/side effects and non-pharmacologic comfort measures Outcome: Progressing   Problem: Tissue Perfusion: Goal: Adequacy of tissue perfusion will improve Outcome: Progressing   Problem: Health Behavior/Discharge Planning: Goal: Ability to manage health-related needs will improve Outcome: Progressing   Problem: Clinical Measurements: Goal: Ability to maintain clinical measurements within normal limits will improve Outcome: Progressing Goal: Will remain free from infection Outcome: Progressing Goal: Diagnostic test results will improve Outcome: Progressing Goal: Respiratory complications will improve Outcome: Progressing Goal: Cardiovascular complication will be avoided Outcome: Progressing   Problem: Pain Managment: Goal: General experience of comfort will improve Outcome: Progressing   Problem: Elimination: Goal: Will not experience complications related to bowel motility Outcome:  Progressing Goal: Will not experience complications related to urinary retention Outcome: Progressing   Problem: Coping: Goal: Level of anxiety will decrease Outcome: Progressing

## 2023-03-06 NOTE — Care Management Important Message (Signed)
Important Message  Patient Details  Name: [name] MRN: [id number] Date of Birth: Aug 12, 1946   Medicare Important Message Given:  N/A - LOS <3 / Initial given by admissions     Johnell Comings 03/06/2023, 9:35 AM

## 2023-03-06 NOTE — Progress Notes (Signed)
[facility] SUR[facility] PROGRESS NOTE (cpt [phone])  Hospital Day(s): 4.  Interval History: Patient seen and examined, no acute events or new complaints overnight. Patient reports he continues to make improvements daily. Abdominal soreness at RLQ drain site and centrally with coughing. No nausea or emesis. No fever in last 48 hours. Leukocytosis remains resolved; 8.7K. Hgb to 12.3; stable. Renal function normal; sCr - 0.98; UO - 750 ccs + unmeasured. Underwent percutaneous drain placement on 07/15; output serous and unmeasured. Cx from this growing GPC and GNR. He is on Zosyn. He is on CLD; tolerating - asking for more. He is passing flatus   Review of Systems:  Constitutional: denied fever, denied chills   HEENT: denies cough or congestion  Respiratory: denies any shortness of breath  Cardiovascular: denies chest pain or palpitations  Gastrointestinal: + abdominal pain (improved; at drain site), denied N/V Genitourinary: denies burning with urination or urinary frequency Musculoskeletal: denies pain, decreased motor or sensation  Vital signs in last 24 hours: [min-max] current  Temp:  [98 F (36.7 C)-99 F (37.2 C)] 99 F (37.2 C) (07/17 0319) Pulse Rate:  [72-82] 77 (07/17 0319) Resp:  [18] 18 (07/17 0319) BP: (133-146)/(68-84) 134/68 (07/17 0319) SpO2:  [95 %-96 %] 95 % (07/17 0319)     Height: 5\' 6"  (167.6 cm) Weight: 75.9 kg BMI (Calculated): 27.02   Intake/Output last 2 shifts:  07/16 0701 - 07/17 0700 In: -  Out: 750 [Urine:750]   Physical Exam:  Constitutional: alert, cooperative and no distress  HENT: normocephalic without obvious abnormality  Eyes: PERRL, EOM's grossly intact and symmetric  Respiratory: breathing non-labored at rest  Cardiovascular: regular rate and sinus rhythm  Gastrointestinal: Abdomen is soft, he is tender over drain site, still distended and tympanic, no rebound/guarding. He is not overtly peritonitic. Drain in RLQ; serous   Musculoskeletal: no edema or wounds, motor and sensation grossly intact, NT    Labs:     Latest Ref Rng & Units 03/06/2023    5:52 AM 03/05/2023    6:30 AM 03/04/2023    5:30 AM  CBC  WBC 4.0 - 10.5 K/uL 8.7  8.4  11.3   Hemoglobin 13.0 - 17.0 g/dL 19.1  47.8  29.5   Hematocrit 39.0 - 52.0 % 35.3  35.3  39.9   Platelets 150 - 400 K/uL 203  162  153       Latest Ref Rng & Units 03/06/2023    5:52 AM 03/05/2023    6:30 AM 03/04/2023    5:30 AM  CMP  Glucose 70 - 99 mg/dL 621  308  657   BUN 8 - 23 mg/dL 9  9  12    Creatinine 0.61 - 1.24 mg/dL 8.46  9.62  9.52   Sodium 135 - 145 mmol/L 135  135  134   Potassium 3.5 - 5.1 mmol/L 3.8  3.6  3.8   Chloride 98 - 111 mmol/L 105  103  105   CO2 22 - 32 mmol/L 20  20  22    Calcium 8.9 - 10.3 mg/dL 7.9  7.8  8.3      Imaging studies:  No new pertinent imaging studies   Assessment/Plan: (ICD-10's: K35.32) 77 y.o. male with perforated appendicitis with developing abscess, complicated by DM2 with hyperglycemia    - Will cautiously advance to FLD. He remains distended but having evidence of bowel function. Encouraged to take it slow, not surprised if he has some degree of ileus  -  IV Abx (Zosyn); follow up Cx  - Appreciate IR assistance; continue percutaneous drain; monitor and reocrd output; flush per orders   - No emergent surgical intervention. He understands we will continue with conservative measures in an effort to avoid more extensive surgical resection in this setting. He will ultimately need interval appendectomy in ~8 weeks once recovered from this insult.   - Monitor abdominal examination; on-going bowel function  - Pain control prn; antiemetics prn  - Monitor fever curve; no fever in last 48 hours  - Monitor leukocytosis; resolved - SSI; check HgbA1c  - Mobilize as tolerated     - Discharge Planning: Doing well clinically, ? Some degree of ileus. Passing gas and diet advancing. Anticipate DC in next 24-48 hours pending  continued clinical progression.   All of the above findings and recommendations were discussed with the patient, and the medical team, and all of patient's questions were answered to his expressed satisfaction.  -- Lynden Oxford, PA-C [facility] 03/06/2023, 7:50 AM M-F: 7am - 4pm

## 2023-03-06 NOTE — Progress Notes (Signed)
Mobility Specialist - Progress Note   03/06/23 1505  Mobility  Activity Ambulated with assistance in room;Ambulated with assistance to bathroom  Level of Assistance Standby assist, set-up cues, supervision of patient - no hands on  Assistive Device None  Distance Ambulated (ft) 24 ft  Activity Response Tolerated well  $Mobility charge 1 Mobility  Mobility Specialist Start Time (ACUTE ONLY) 1445  Mobility Specialist Stop Time (ACUTE ONLY) 1450  Mobility Specialist Time Calculation (min) (ACUTE ONLY) 5 min   Zetta Bills Mobility Specialist 03/06/23 3:07 PM

## 2023-03-07 DIAGNOSIS — K3533 Acute appendicitis with perforation and localized peritonitis, with abscess: Secondary | ICD-10-CM | POA: Diagnosis not present

## 2023-03-07 LAB — GLUCOSE, CAPILLARY
Glucose-Capillary: 176 mg/dL — ABNORMAL HIGH (ref 70–99)
Glucose-Capillary: 168 mg/dL — ABNORMAL HIGH (ref 70–99)

## 2023-03-07 LAB — CULTURE, BLOOD (ROUTINE X 2)
Culture: NO GROWTH
Culture: NO GROWTH
Special Requests: ADEQUATE
Special Requests: ADEQUATE

## 2023-03-07 MED ORDER — AMOXICILLIN-POT CLAVULANATE 875-125 MG PO TABS: 1.0000 | ORAL_TABLET | Freq: Two times a day (BID) | ORAL | 0 refills | Status: AC

## 2023-03-07 MED ORDER — HYDROCODONE-ACETAMINOPHEN 5-325 MG PO TABS: 1.0000 | ORAL_TABLET | Freq: Four times a day (QID) | ORAL | 0 refills | Status: DC | PRN

## 2023-03-07 MED ORDER — ONDANSETRON 4 MG PO TBDP: 4.0000 mg | ORAL_TABLET | Freq: Four times a day (QID) | ORAL | 0 refills | Status: DC | PRN

## 2023-03-07 NOTE — Discharge Instructions (Signed)
In addition to included general post-operative instructions,  Diet: Resume home diet.   Drain: Monitor and record drain output daily; hand out given to record this. Please bring this with you to your appointment.   Wound care: If you can keep drain site covered and water proofed, you may shower/get wet with soapy water and pat dry (do not rub incisions), but no baths or submerging incision underwater until follow-up.   Call office [phone] - General Surgery) at any time if any questions, worsening pain, fevers/chills, bleeding, drainage from incision site, or other concerns.

## 2023-03-07 NOTE — Discharge Summary (Signed)
[facility] SUR[facility]ICAL DISCHARGE SUMMARY (cpt: [phone])  Patient ID: [name] MRN: [id number] DOB/AGE: [birth year] 77 y.o.  Admit date: 03/02/2023 Discharge date: 03/07/2023  Discharge Diagnoses Patient Active Problem List   Diagnosis Date Noted   Perforated appendicitis 03/02/2023   Acute perforated appendicitis 03/02/2023    Consultants Interventional Radiology  Procedures None  HPI: [name] is a 77 y.o. male with a prior history of back in the 60s of a similar pain that subsided.  Today is Saturday, Tuesday afternoon he began having some shifting nonlocalized abdominal pain, this continued through the week, subsequently associated most recently with shivers.  Reporting that his head felt warm to touch, no fever measured.  Today the pain became localized to the right lower quadrant, and is apparently resolved elsewhere. He denies any prior history aside from that quite remotely.  Denies any dysuria, diarrhea constipation, nausea, vomiting.  He reports anorexia and diminished p.o. intake. He reports his blood sugars have been higher than anticipated considering he has not eaten well.  Hospital Course: Patient was admitted to general surgery service and underwent conservative management given perforation. He continued to be intermittently febrile through the first 2 days of his hospitalization. CT Abdomen/Pelvis was repeated on 07/15 and concerning for developing abscess. Interventional radiology was consulted and underwent drain placement on 07/15. Cx from this grew GNR and GPC. Fevers and leukocytosis resolved the following day. Advancement of patient's diet and ambulation were well-tolerated. The remainder of patient's hospital course was essentially unremarkable, and discharge planning was initiated accordingly with patient safely able to be discharged home with appropriate discharge instructions, antibiotics (Augmentin x11 days to complete 14 days total from drain  placement), pain control, and outpatient follow-up after all of his questions were answered to his expressed satisfaction.   Discharge Condition: Good   Physical Examination:  Constitutional: alert, cooperative and no distress  HENT: normocephalic without obvious abnormality  Eyes: PERRL, EOM's grossly intact and symmetric  Respiratory: breathing non-labored at rest  Cardiovascular: regular rate and sinus rhythm  Gastrointestinal: Abdomen is soft, he is tender over drain site, still distended and tympanic, no rebound/guarding. He is not overtly peritonitic. Drain in RLQ; serous  Musculoskeletal: no edema or wounds, motor and sensation grossly intact, NT    Allergies as of 03/07/2023       Reactions   Sulfonamide Derivatives    REACTION: unknown reaction        Medication List     TAKE these medications    Accu-Chek Aviva Plus test strip Generic drug: glucose blood Check blood sugar once daily and as instructed. Dx E11.65   Accu-Chek Aviva Plus w/Device Kit Check blood sugar once daily and as instructed. Dx E11.65   Accu-Chek Softclix Lancets lancets Check blood sugar once daily and as instructed. Dx E11.65   amLODipine 10 MG tablet Commonly known as: NORVASC Take 1 tablet (10 mg total) by mouth daily. PLEASE SCHEDULE OFFICE VISIT FOR FURTHER REFILLS. THANK YOU! What changed: additional instructions   amoxicillin-clavulanate 875-125 MG tablet Commonly known as: AUGMENTIN Take 1 tablet by mouth 2 (two) times daily for 11 days.   aspirin 81 MG tablet Take 1 tablet (81 mg total) by mouth daily.   atorvastatin 80 MG tablet Commonly known as: LIPITOR TAKE 1 TABLET(80 MG) BY MOUTH DAILY What changed: See the new instructions.   calcium carbonate 500 MG chewable tablet Commonly known as: TUMS - dosed in mg elemental calcium Chew 1 tablet by mouth daily  as needed for indigestion or heartburn.   doxazosin 1 MG tablet Commonly known as: CARDURA TAKE 1 TABLET(1 MG)  BY MOUTH DAILY What changed: See the new instructions.   fish oil-omega-3 fatty acids 1000 MG capsule Take 2 g by mouth daily.   HYDROcodone-acetaminophen 5-325 MG tablet Commonly known as: NORCO/VICODIN Take 1 tablet by mouth every 6 (six) hours as needed for severe pain.   losartan 100 MG tablet Commonly known as: COZAAR TAKE 1 TABLET(100 MG) BY MOUTH DAILY What changed: See the new instructions.   metFORMIN 500 MG 24 hr tablet Commonly known as: GLUCOPHAGE-XR TAKE 4 TABLETS BY MOUTH EVERY DAY WITH BREAKFAST What changed: See the new instructions.   metoprolol succinate 50 MG 24 hr tablet Commonly known as: TOPROL-XL Take 1 tablet (50 mg total) by mouth daily. PLEASE SCHEDULE OFFICE VISIT FOR FURTHER REFILLS. THANK YOU!   multivitamin tablet Take 1 tablet by mouth daily.   ondansetron 4 MG disintegrating tablet Commonly known as: ZOFRAN-ODT Take 1 tablet (4 mg total) by mouth every 6 (six) hours as needed for nausea.   sodium chloride flush 0.9 % Soln injection 5 mLs by Intracatheter route daily.          Follow-up Information     Campbell Lerner, MD. Go on 03/19/2023.   Specialty: General Surgery Why: Go to appointment on 07/30 at 1015 AM Contact information: [address] [place] [address] [phone]                  Time spent on discharge management including discussion of hospital course, clinical condition, outpatient instructions, prescriptions, and follow up with the patient and members of the medical team: >30 minutes  -- Lynden Oxford , PA-C [facility]  03/07/2023, 8:31 AM [phone] M-F: 7am - 4pm

## 2023-03-07 NOTE — Progress Notes (Signed)
Mobility Specialist - Progress Note   03/07/23 1049  Mobility  Activity Ambulated independently in hallway;Ambulated independently in room  Level of Assistance Modified independent, requires aide device or extra time  Assistive Device None  Distance Ambulated (ft) 200 ft  Activity Response Tolerated well  $Mobility charge 1 Mobility  Mobility Specialist Start Time (ACUTE ONLY) 1028  Mobility Specialist Stop Time (ACUTE ONLY) 1048  Mobility Specialist Time Calculation (min) (ACUTE ONLY) 20 min   Pt supine upon entry, utilizing RA. Pt agreeable to OOB amb in the hallway this date. Pt completed bed mob, dons pants and shoes, STS and amb ModI-- extra time required. Pt amb one lap around 2C NS, ~140 ft into amb Pt expressed abd pain. Pt returned to the room, left sitting EOB with alarm set and needs within reach.   Zetta Bills Mobility Specialist 03/07/23 10:53 AM

## 2023-03-07 NOTE — Progress Notes (Signed)
[name] to be D/C'd Home per MD order.  Discussed prescriptions and follow up appointments with the patient. Prescriptions given to patient, medication list explained in detail. Pt verbalized understanding.  Allergies as of 03/07/2023       Reactions   Sulfonamide Derivatives    REACTION: unknown reaction        Medication List     TAKE these medications    Accu-Chek Aviva Plus test strip Generic drug: glucose blood Check blood sugar once daily and as instructed. Dx E11.65   Accu-Chek Aviva Plus w/Device Kit Check blood sugar once daily and as instructed. Dx E11.65   Accu-Chek Softclix Lancets lancets Check blood sugar once daily and as instructed. Dx E11.65   amLODipine 10 MG tablet Commonly known as: NORVASC Take 1 tablet (10 mg total) by mouth daily. PLEASE SCHEDULE OFFICE VISIT FOR FURTHER REFILLS. THANK YOU! What changed: additional instructions   amoxicillin-clavulanate 875-125 MG tablet Commonly known as: AUGMENTIN Take 1 tablet by mouth 2 (two) times daily for 11 days.   aspirin 81 MG tablet Take 1 tablet (81 mg total) by mouth daily.   atorvastatin 80 MG tablet Commonly known as: LIPITOR TAKE 1 TABLET(80 MG) BY MOUTH DAILY What changed: See the new instructions.   calcium carbonate 500 MG chewable tablet Commonly known as: TUMS - dosed in mg elemental calcium Chew 1 tablet by mouth daily as needed for indigestion or heartburn.   doxazosin 1 MG tablet Commonly known as: CARDURA TAKE 1 TABLET(1 MG) BY MOUTH DAILY What changed: See the new instructions.   fish oil-omega-3 fatty acids 1000 MG capsule Take 2 g by mouth daily.   HYDROcodone-acetaminophen 5-325 MG tablet Commonly known as: NORCO/VICODIN Take 1 tablet by mouth every 6 (six) hours as needed for severe pain.   losartan 100 MG tablet Commonly known as: COZAAR TAKE 1 TABLET(100 MG) BY MOUTH DAILY What changed: See the new instructions.   metFORMIN 500 MG 24 hr tablet Commonly known  as: GLUCOPHAGE-XR TAKE 4 TABLETS BY MOUTH EVERY DAY WITH BREAKFAST What changed: See the new instructions.   metoprolol succinate 50 MG 24 hr tablet Commonly known as: TOPROL-XL Take 1 tablet (50 mg total) by mouth daily. PLEASE SCHEDULE OFFICE VISIT FOR FURTHER REFILLS. THANK YOU!   multivitamin tablet Take 1 tablet by mouth daily.   ondansetron 4 MG disintegrating tablet Commonly known as: ZOFRAN-ODT Take 1 tablet (4 mg total) by mouth every 6 (six) hours as needed for nausea.   sodium chloride flush 0.9 % Soln injection 5 mLs by Intracatheter route daily.        Vitals:   03/07/23 0755 03/07/23 1602  BP: (!) 148/82 (!) 144/90  Pulse: 77 73  Resp: 17 16  Temp: 99.1 F (37.3 C) 99.4 F (37.4 C)  SpO2: 96% 96%    Skin clean, dry and intact without evidence of skin break down, no evidence of skin tears noted. IV catheter discontinued intact. Site without signs and symptoms of complications. Dressing and pressure applied. Pt denies pain at this time. No complaints noted.  An After Visit Summary was printed and given to the patient. Patient escorted via WC, and D/C home via private auto.  [name] C. Jilda Roche

## 2023-03-08 ENCOUNTER — Other Ambulatory Visit: Payer: Self-pay | Admitting: Surgery

## 2023-03-08 ENCOUNTER — Telehealth: Payer: Self-pay | Admitting: *Deleted

## 2023-03-08 DIAGNOSIS — K3532 Acute appendicitis with perforation and localized peritonitis, without abscess: Secondary | ICD-10-CM

## 2023-03-08 NOTE — Transitions of Care (Post Inpatient/ED Visit) (Signed)
03/08/2023  Name: [name] MRN: [id number] DOB: 1946-04-03  Today's TOC FU Call Status: Today's TOC FU Call Status:: Successful TOC FU Call Competed TOC FU Call Complete Date: 03/08/23  Transition Care Management Follow-up Telephone Call Date of Discharge: 03/07/23 Discharge Facility: [facility]) Type of Discharge: Inpatient Admission Primary Inpatient Discharge Diagnosis:: acute Perforated appendix How have you been since you were released from the hospital?: Better Any questions or concerns?: No  Items Reviewed: Did you receive and understand the discharge instructions provided?: Yes Medications obtained,verified, and reconciled?: Yes (Medications Reviewed) Any new allergies since your discharge?: No Dietary orders reviewed?: No Do you have support at home?: Yes People in Home: significant other Name of Support/Comfort Primary Source: Debra  Medications Reviewed Today: Medications Reviewed Today     Reviewed by Luella Cook, RN (Case Manager) on 03/08/23 at 1124  Med List Status: <None>   Medication Order Taking? Sig Documenting Provider Last Dose Status Informant  ACCU-CHEK SOFTCLIX LANCETS lancets [id number] Yes Check blood sugar once daily and as instructed. Dx E11.65 Hannah Beat, MD Taking Active   amLODipine (NORVASC) 10 MG tablet [id number] Yes Take 1 tablet (10 mg total) by mouth daily. PLEASE SCHEDULE OFFICE VISIT FOR FURTHER REFILLS. THANK YOU! Antonieta Iba, MD Taking Active   amoxicillin-clavulanate (AUGMENTIN) 875-125 MG tablet [id number] Yes Take 1 tablet by mouth 2 (two) times daily for 11 days. Donovan Kail, PA-C Taking Active   aspirin 81 MG tablet [id number] Yes Take 1 tablet (81 mg total) by mouth daily. Antonieta Iba, MD Taking Active   atorvastatin (LIPITOR) 80 MG tablet [id number] Yes TAKE 1 TABLET(80 MG) BY MOUTH DAILY  Patient taking differently: Take 80 mg by mouth daily. TAKE 1 TABLET(80 MG) BY MOUTH  DAILY   Copland, Spencer, MD Taking Active   Blood Glucose Monitoring Suppl (ACCU-CHEK AVIVA PLUS) w/Device KIT [id number] Yes Check blood sugar once daily and as instructed. Dx E11.65 Hannah Beat, MD Taking Active   calcium carbonate (TUMS - DOSED IN MG ELEMENTAL CALCIUM) 500 MG chewable tablet [id number] Yes Chew 1 tablet by mouth daily as needed for indigestion or heartburn. [provider] Taking Active   doxazosin (CARDURA) 1 MG tablet [id number] Yes TAKE 1 TABLET(1 MG) BY MOUTH DAILY  Patient taking differently: Take 1 mg by mouth daily.   Antonieta Iba, MD Taking Active   fish oil-omega-3 fatty acids 1000 MG capsule [id number] Yes Take 2 g by mouth daily. [provider] Taking Active   glucose blood (ACCU-CHEK AVIVA PLUS) test strip [id number] Yes Check blood sugar once daily and as instructed. Dx E11.65 Hannah Beat, MD Taking Active   HYDROcodone-acetaminophen (NORCO/VICODIN) 5-325 MG tablet [id number] Yes Take 1 tablet by mouth every 6 (six) hours as needed for severe pain. Donovan Kail, PA-C Taking Active   losartan (COZAAR) 100 MG tablet [id number] Yes TAKE 1 TABLET(100 MG) BY MOUTH DAILY  Patient taking differently: Take 100 mg by mouth daily. TAKE 1 TABLET(100 MG) BY MOUTH DAILY   Copland, Karleen Hampshire, MD Taking Active   metFORMIN (GLUCOPHAGE-XR) 500 MG 24 hr tablet [id number] Yes TAKE 4 TABLETS BY MOUTH EVERY DAY WITH BREAKFAST  Patient taking differently: Take 500 mg by mouth daily with breakfast. TAKE 4 TABLETS BY MOUTH EVERY DAY WITH BREAKFAST   Copland, Spencer, MD Taking Active   metoprolol succinate (TOPROL-XL) 50 MG 24 hr tablet [id number] Yes Take 1 tablet (50 mg total) by mouth daily. PLEASE SCHEDULE OFFICE  VISIT FOR FURTHER REFILLS. THANK YOU! Antonieta Iba, MD Taking Active   Multiple Vitamin (MULTIVITAMIN) tablet [id number] Yes Take 1 tablet by mouth daily. [provider] Taking Active   ondansetron (ZOFRAN-ODT) 4 MG disintegrating  tablet [id number] Yes Take 1 tablet (4 mg total) by mouth every 6 (six) hours as needed for nausea. Donovan Kail, PA-C Taking Active   sodium chloride flush 0.9 % SOLN injection [id number] Yes 5 mLs by Intracatheter route daily. Kennieth Francois, PA Taking Active             Home Care and Equipment/Supplies: Were Home Health Services Ordered?: NA Any new equipment or medical supplies ordered?: NA  Functional Questionnaire: Do you need assistance with bathing/showering or dressing?: Yes Do you need assistance with meal preparation?: Yes Do you have difficulty maintaining continence: No Do you need assistance with getting out of bed/getting out of a chair/moving?: No Do you have difficulty managing or taking your medications?: No  Follow up appointments reviewed: PCP Follow-up appointment confirmed?: NA Specialist Hospital Follow-up appointment confirmed?: Yes Date of Specialist follow-up appointment?: 03/19/23 Follow-Up Specialty Provider:: Dr Claudine Mouton Do you need transportation to your follow-up appointment?: No Do you understand care options if your condition(s) worsen?: Yes-patient verbalized understanding  SDOH Interventions Today    Flowsheet Row Most Recent Value  SDOH Interventions   Food Insecurity Interventions Intervention Not Indicated  Housing Interventions Intervention Not Indicated  Transportation Interventions Intervention Not Indicated, Patient Resources (Friends/Family)      Interventions Today    Flowsheet Row Most Recent Value  General Interventions   General Interventions Discussed/Reviewed General Interventions Discussed, General Interventions Reviewed, Doctor Visits  Doctor Visits Discussed/Reviewed Doctor Visits Discussed, Doctor Visits Reviewed  Pharmacy Interventions   Pharmacy Dicussed/Reviewed Pharmacy Topics Discussed      TOC Interventions Today    Flowsheet Row Most Recent Value  TOC Interventions   TOC Interventions  Discussed/Reviewed TOC Interventions Discussed, TOC Interventions Reviewed      RN discussed documenting drain output and taking to DR visit.   Gean Maidens BSN RN [facility] [phone]

## 2023-03-11 ENCOUNTER — Telehealth: Payer: Self-pay | Admitting: *Deleted

## 2023-03-11 ENCOUNTER — Ambulatory Visit: Payer: Medicare HMO

## 2023-03-11 DIAGNOSIS — K3533 Acute appendicitis with perforation and localized peritonitis, with abscess: Secondary | ICD-10-CM

## 2023-03-11 DIAGNOSIS — K3532 Acute appendicitis with perforation and localized peritonitis, without abscess: Secondary | ICD-10-CM

## 2023-03-11 NOTE — Progress Notes (Signed)
Patient ID: [name], male   DOB: 05-29-1946, 77 y.o.   MRN: [id number] Patient here for his drain check. Was unable to flush his drain. Shown how to access the drain and flushing performed on drain. Patient confident he can now do this on his own.

## 2023-03-11 NOTE — Telephone Encounter (Signed)
Patient states that he is unable unscrew and open his tubing to flush the drain. Will come in this afternoon for a nurse visit regarding this.

## 2023-03-11 NOTE — Telephone Encounter (Signed)
Patient called and would a nurse to give him a call in regards to his drain that he has. He needs some clarification on what to do when flushing his drain. Please call and advise

## 2023-03-12 ENCOUNTER — Telehealth: Payer: Self-pay | Admitting: Cardiovascular Disease

## 2023-03-12 NOTE — Telephone Encounter (Signed)
Left voicemail, patient needs to schedule 12 mo follow up from recall.

## 2023-03-12 NOTE — Telephone Encounter (Signed)
Left voice mail

## 2023-03-14 NOTE — Telephone Encounter (Signed)
Pt is scheduled on 9/23

## 2023-03-19 ENCOUNTER — Ambulatory Visit: Payer: Medicare HMO | Admitting: Surgery

## 2023-03-19 ENCOUNTER — Other Ambulatory Visit
Admission: RE | Admit: 2023-03-19 | Discharge: 2023-03-19 | Disposition: A | Discharge status: Home / Self Care | Payer: Medicare HMO | Source: Ambulatory Visit | Attending: Surgery | Admitting: Surgery

## 2023-03-19 ENCOUNTER — Encounter: Payer: Self-pay | Admitting: Surgery

## 2023-03-19 VITALS — BP 120/71 | HR 89 | Temp 98.1°F | Ht 66.0 in | Wt 153.0 lb

## 2023-03-19 DIAGNOSIS — K3533 Acute appendicitis with perforation and localized peritonitis, with abscess: Secondary | ICD-10-CM

## 2023-03-19 DIAGNOSIS — K3532 Acute appendicitis with perforation and localized peritonitis, without abscess: Secondary | ICD-10-CM

## 2023-03-19 LAB — CBC
HCT: 39 % (ref 39.0–52.0)
Hemoglobin: 13.2 g/dL (ref 13.0–17.0)
MCH: 31 pg (ref 26.0–34.0)
MCHC: 33.8 g/dL (ref 30.0–36.0)
MCV: 91.5 fL (ref 80.0–100.0)
Platelets: 350 10*3/uL (ref 150–400)
RBC: 4.26 MIL/uL (ref 4.22–5.81)
RDW: 12.2 % (ref 11.5–15.5)
WBC: 7.5 10*3/uL (ref 4.0–10.5)
nRBC: 0 % (ref 0.0–0.2)

## 2023-03-19 NOTE — Patient Instructions (Signed)
If you have any concerns or questions, please feel free to call our office.   Appendicitis, Adult  The appendix is a tube in the body that is shaped like a finger. It is attached to the large intestine. Appendicitis means that this tube is swollen (inflamed). If this is not treated, the tube can tear. This can lead to a life-threatening infection. This condition can also cause pus to build up in the appendix. What are the causes? Something blocking the appendix, such as: A ball of poop (stool). Lymph glands that are bigger than normal. Injury to the belly (abdomen). Sometimes the cause is not known. What increases the risk? You are more likely to get this condition if you are 7-31 years old. What are the signs or symptoms? Pain or tenderness that starts around the belly button and: Moves toward the lower right belly. Gets worse with time. Gets worse if you cough. Gets worse if you make a sudden move. Vomiting or feeling like you may vomit (nauseous). Not wanting to eat as much as normal (loss of appetite). A fever. Trouble pooping (constipation). Watery poop (diarrhea). Feel generally sick (malaise). How is this treated? In general, this condition is treated with both antibiotic medicines and surgery to take out the appendix. If only antibiotics are given and the appendix is not taken out, there is a chance the condition could come back. There are two ways to take out the appendix: Open surgery. For this method, the appendix is taken out through a large cut. This cut is called an incision and is made in the lower right belly. This surgery may be used if: You have scars from another surgery. You have a bleeding condition. You are pregnant and will be having your baby soon. You have a condition that makes it hard to do the other type of surgery. Laparoscopic surgery. For this method, the appendix is taken out through small cuts. Often, this surgery: Causes less pain. Causes fewer  problems. Heals faster. If your appendix tears and pus forms: A drain may be put into the sore. The drain will be used to get rid of the pus. You may get an antibiotic through an IV tube. Your appendix may or may not need to be taken out. Follow these instructions at home: If you had surgery: Follow instructions from your doctor on how to: Care for yourself at home. Take care of your cut from surgery. Medicines Take over-the-counter and prescription medicines only as told by your doctor. If you were prescribed an antibiotic medicine, take it as told by your doctor. Do not stop taking it even if you start to feel better. If told, take steps to prevent problems with pooping (constipation). You may need to: Drink enough fluid to keep your pee (urine) pale yellow. Take medicines. You will be told what medicines to take. Eat foods that are high in fiber. These include beans, whole grains, and fresh fruits and vegetables. Limit foods that are high in fat and sugar. These include fried or sweet foods. Ask your doctor if you should avoid driving or using machines while you are taking your medicine. General instructions Follow instructions from your doctor about what you cannot eat or drink. Do not smoke or use any products that contain nicotine or tobacco. If you need help quitting, ask your doctor. Return to your normal activities when your doctor says that it is safe. Keep all follow-up visits. Contact a doctor if: There is pus, blood, or a  lot of fluid coming from your cut or cuts from surgery. You feel like you may vomit, or you vomit. You have a fever. You are very tired. You have muscle pain. Get help right away if: You have pain in your belly, and the pain is getting worse. You are short of breath. You cannot stop vomiting. These symptoms may be an emergency. Get help right away. Call your local emergency services (911 in the [place] Do not wait to see if the symptoms will go  away. Do not drive yourself to the hospital. Summary Appendicitis is swelling of the appendix. The appendix is a tube that is shaped like a finger. It is joined to the large intestine. This condition may be caused by something that blocks the appendix. This can lead to an infection. This condition is most often treated with both antibiotic medicines and taking out the appendix. This information is not intended to replace advice given to you by your health care provider. Make sure you discuss any questions you have with your health care provider. Document Revised: 01/26/2021 Document Reviewed: 01/26/2021 Elsevier Patient Education  2024 [employer].

## 2023-03-19 NOTE — Progress Notes (Signed)
Patient ID: [name], male   DOB: Aug 24, 1945, 77 y.o.   MRN: [id number]  Chief Complaint: Right lower quadrant drain for perforated appendicitis with abscess.  History of Present Illness [name] is a 77 y.o. male with nearing 2 weeks following discharge, was discharged with Augmentin, but did not initiate taking it as soon as they obtained it.  Therefore he is still taking it now.  Drain output though has been minimal to none, he denies any fevers chills.  He reports some degree of anorexia, fatigue and malaise.  Past Medical History Past Medical History:  Diagnosis Date   Alcohol abuse, unspecified    CAD (coronary artery disease) 04/20/2005   CABG x4   Colon polyps    Depression    DMII (diabetes mellitus, type 2) (HCC) 05/20/2002   HLD (hyperlipidemia) 05/20/2002   HTN (hypertension) 05/20/2002   Motion sickness    planes, deep sea fishing      Past Surgical History:  Procedure Laterality Date   CATARACT EXTRACTION W/PHACO Right 04/12/2022   Procedure: CATARACT EXTRACTION PHACO AND INTRAOCULAR LENS PLACEMENT (IOC) RIGHT MALYUGIN OMIDRIA IRIS HOOKS;  Surgeon: Estanislado Pandy, MD;  Location: [facility];  Service: Ophthalmology;  Laterality: Right;  16.16 1:33.8   CATARACT EXTRACTION W/PHACO Left 04/26/2022   Procedure: CATARACT EXTRACTION PHACO AND INTRAOCULAR LENS PLACEMENT (IOC) LEFT MALYUGIN OMIDIRA IRIS HOOKS;  Surgeon: Estanislado Pandy, MD;  Location[facility];  Service: Ophthalmology;  Laterality: Left;  14.02 2:13.8   COLONOSCOPY     CORONARY ARTERY BYPASS GRAFT  9/06   x4   ESOPHAGOGASTRODUODENOSCOPY  7/07   polyp; mucosal abnml   TONSILLECTOMY      Allergies  Allergen Reactions   Sulfonamide Derivatives     REACTION: unknown reaction    Current Outpatient Medications  Medication Sig Dispense Refill   ACCU-CHEK SOFTCLIX LANCETS lancets Check blood sugar once daily and as instructed. Dx E11.65 100 each 3   amLODipine  (NORVASC) 10 MG tablet Take 1 tablet (10 mg total) by mouth daily. PLEASE SCHEDULE OFFICE VISIT FOR FURTHER REFILLS. THANK YOU! 30 tablet 0   aspirin 81 MG tablet Take 1 tablet (81 mg total) by mouth daily.     atorvastatin (LIPITOR) 80 MG tablet TAKE 1 TABLET(80 MG) BY MOUTH DAILY (Patient taking differently: Take 80 mg by mouth daily. TAKE 1 TABLET(80 MG) BY MOUTH DAILY) 90 tablet 3   Blood Glucose Monitoring Suppl (ACCU-CHEK AVIVA PLUS) w/Device KIT Check blood sugar once daily and as instructed. Dx E11.65 1 kit 0   calcium carbonate (TUMS - DOSED IN MG ELEMENTAL CALCIUM) 500 MG chewable tablet Chew 1 tablet by mouth daily as needed for indigestion or heartburn.     doxazosin (CARDURA) 1 MG tablet TAKE 1 TABLET(1 MG) BY MOUTH DAILY (Patient taking differently: Take 1 mg by mouth daily.) 30 tablet 0   fish oil-omega-3 fatty acids 1000 MG capsule Take 2 g by mouth daily.     glucose blood (ACCU-CHEK AVIVA PLUS) test strip Check blood sugar once daily and as instructed. Dx E11.65 100 each 3   losartan (COZAAR) 100 MG tablet TAKE 1 TABLET(100 MG) BY MOUTH DAILY (Patient taking differently: Take 100 mg by mouth daily. TAKE 1 TABLET(100 MG) BY MOUTH DAILY) 90 tablet 0   metFORMIN (GLUCOPHAGE-XR) 500 MG 24 hr tablet TAKE 4 TABLETS BY MOUTH EVERY DAY WITH BREAKFAST (Patient taking differently: Take 500 mg by mouth daily with breakfast. TAKE 4 TABLETS  BY MOUTH EVERY DAY WITH BREAKFAST) 360 tablet 1   metoprolol succinate (TOPROL-XL) 50 MG 24 hr tablet Take 1 tablet (50 mg total) by mouth daily. PLEASE SCHEDULE OFFICE VISIT FOR FURTHER REFILLS. THANK YOU! 30 tablet 0   Multiple Vitamin (MULTIVITAMIN) tablet Take 1 tablet by mouth daily.     ondansetron (ZOFRAN-ODT) 4 MG disintegrating tablet Take 1 tablet (4 mg total) by mouth every 6 (six) hours as needed for nausea. 20 tablet 0   sodium chloride flush 0.9 % SOLN injection 5 mLs by Intracatheter route daily. 100 mL 2   No current facility-administered  medications for this visit.    Family History Family History  Problem Relation Age of Onset   Alcohol abuse Father    Stroke Father    Other Father        cardiac problems   Heart disease Father    COPD Mother    Alcohol abuse Brother    Cirrhosis Brother        EtOH   Anxiety disorder Brother    Heart disease Paternal Grandmother    Colon cancer Neg Hx    Esophageal cancer Neg Hx    Rectal cancer Neg Hx    Stomach cancer Neg Hx       Social History Social History   Tobacco Use   Smoking status: Former   Smokeless tobacco: Never   Tobacco comments:    quit 41 years ago  Vaping Use   Vaping status: Never Used  Substance Use Topics   Alcohol use: Yes    Alcohol/week: 17.0 standard drinks of alcohol    Types: 2 Glasses of wine, 15 Shots of liquor per week    Comment: 2 glasses of wine or 3-4 shots of bourbon daily   Drug use: No       Physical Exam Blood pressure 120/71, pulse 89, temperature 98.1 F (36.7 C), temperature source Oral, height 5\' 6"  (1.676 m), weight 153 lb (69.4 kg), SpO2 97%. Last Weight  Most recent update: 03/19/2023 10:02 AM    Weight  69.4 kg (153 lb)             CONSTITUTIONAL: Well developed, and nourished, appropriately responsive and aware without distress.   EYES: Sclera non-icteric.   EARS, NOSE, MOUTH AND THROAT:  The oropharynx is clear. Oral mucosa is pink and moist.     Hearing is intact to voice.  NECK: Trachea is midline, and there is no jugular venous distension.  LYMPH NODES:  Lymph nodes in the neck are not appreciated. RESPIRATORY:   Normal respiratory effort without pathologic use of accessory muscles. CARDIOVASCULAR:  Well perfused.  GI: The abdomen is soft, nontender, and nondistended. There were no palpable masses.  The drain appeared partially dislodged, no longer in the external anchoring system.  The suture was still intact but there was some laxity between the suture and the entrance of the skin.  Was somewhat  angry and crusted there.  I felt it was prudent to go ahead and proceed with its removal considering the fact that they have had very little output over the last week and a half. MUSCULOSKELETAL:  Symmetrical muscle tone appreciated in all four extremities.    SKIN: Skin turgor is normal. No pathologic skin lesions appreciated.  NEUROLOGIC:  Motor and sensation appear grossly normal.  Cranial nerves are grossly without defect. PSYCH:  Alert and oriented to person, place and time. Affect is appropriate for situation.  Data Reviewed I  have personally reviewed what is currently available of the patient's imaging, recent labs and medical records.   Labs:     Latest Ref Rng & Units 03/06/2023    5:52 AM 03/05/2023    6:30 AM 03/04/2023    5:30 AM  CBC  WBC 4.0 - 10.5 K/uL 8.7  8.4  11.3   Hemoglobin 13.0 - 17.0 g/dL 16.1  09.6  04.5   Hematocrit 39.0 - 52.0 % 35.3  35.3  39.9   Platelets 150 - 400 K/uL 203  162  153       Latest Ref Rng & Units 03/06/2023    5:52 AM 03/05/2023    6:30 AM 03/04/2023    5:30 AM  CMP  Glucose 70 - 99 mg/dL 409  811  914   BUN 8 - 23 mg/dL 9  9  12    Creatinine 0.61 - 1.24 mg/dL 7.82  9.56  2.13   Sodium 135 - 145 mmol/L 135  135  134   Potassium 3.5 - 5.1 mmol/L 3.8  3.6  3.8   Chloride 98 - 111 mmol/L 105  103  105   CO2 22 - 32 mmol/L 20  20  22    Calcium 8.9 - 10.3 mg/dL 7.9  7.8  8.3       Imaging: Radiological images reviewed:   Within last 24 hrs: No results found.  Assessment    Proceeded with drain removal, will have him complete antibiotics. Patient Active Problem List   Diagnosis Date Noted   Perforated appendicitis 03/02/2023   Acute perforated appendicitis 03/02/2023   CAD (coronary artery disease), autologous vein bypass graft 10/11/2011   Essential hypertension    Alcohol abuse 08/10/2010   Diabetes mellitus treated with oral medication (HCC) 04/13/2009   Hyperlipidemia LDL goal <70 05/13/2008   History of colonic polyps  03/12/2007   DEPRESSION 12/03/2006    Plan    Will check CBC today, continue with planned/scheduled CT scan for next week, may expedite it if white blood cell count abnormal, or if he worsens after drain removal.   Face-to-face time spent with the patient and accompanying care providers(if present) was 20 minutes, with more than 50% of the time spent counseling, educating, and coordinating care of the patient.    These notes generated with voice recognition software. I apologize for typographical errors.  Campbell Lerner M.D., FACS 03/19/2023, 10:04 AM

## 2023-03-21 ENCOUNTER — Other Ambulatory Visit: Payer: Self-pay

## 2023-03-26 ENCOUNTER — Ambulatory Visit
Admission: RE | Admit: 2023-03-26 | Discharge: 2023-03-26 | Disposition: A | Discharge status: Home / Self Care | Payer: Medicare HMO | Source: Ambulatory Visit | Attending: Student | Admitting: Student

## 2023-03-26 ENCOUNTER — Ambulatory Visit: Admission: RE | Admit: 2023-03-26 | Payer: Medicare HMO | Source: Ambulatory Visit

## 2023-03-26 ENCOUNTER — Ambulatory Visit
Admission: RE | Admit: 2023-03-26 | Discharge: 2023-03-26 | Disposition: A | Discharge status: Home / Self Care | Payer: Medicare HMO | Source: Ambulatory Visit | Attending: Surgery | Admitting: Surgery

## 2023-03-26 DIAGNOSIS — K8020 Calculus of gallbladder without cholecystitis without obstruction: Secondary | ICD-10-CM | POA: Diagnosis not present

## 2023-03-26 DIAGNOSIS — K3532 Acute appendicitis with perforation and localized peritonitis, without abscess: Secondary | ICD-10-CM

## 2023-03-26 DIAGNOSIS — N329 Bladder disorder, unspecified: Secondary | ICD-10-CM | POA: Diagnosis not present

## 2023-03-26 DIAGNOSIS — N400 Enlarged prostate without lower urinary tract symptoms: Secondary | ICD-10-CM | POA: Diagnosis not present

## 2023-03-26 DIAGNOSIS — K3533 Acute appendicitis with perforation and localized peritonitis, with abscess: Secondary | ICD-10-CM | POA: Diagnosis not present

## 2023-03-26 HISTORY — PX: IR RADIOLOGIST EVAL & MGMT: IMG5224

## 2023-03-26 MED ORDER — IOPAMIDOL (ISOVUE-300) INJECTION 61%
100.0000 mL | Freq: Once | INTRAVENOUS | Status: AC | PRN
  Administered 2023-03-26: 100 mL via INTRAVENOUS

## 2023-03-27 ENCOUNTER — Other Ambulatory Visit: Payer: Self-pay | Admitting: Cardiovascular Disease

## 2023-03-27 ENCOUNTER — Telehealth: Payer: Self-pay | Admitting: Cardiovascular Disease

## 2023-03-27 ENCOUNTER — Other Ambulatory Visit: Payer: Self-pay

## 2023-03-27 ENCOUNTER — Other Ambulatory Visit
Admission: RE | Admit: 2023-03-27 | Discharge: 2023-03-27 | Disposition: A | Discharge status: Home / Self Care | Payer: Medicare HMO | Attending: Surgery | Admitting: Surgery

## 2023-03-27 ENCOUNTER — Other Ambulatory Visit: Payer: Self-pay | Admitting: Family Medicine

## 2023-03-27 DIAGNOSIS — K3532 Acute appendicitis with perforation and localized peritonitis, without abscess: Secondary | ICD-10-CM | POA: Insufficient documentation

## 2023-03-27 LAB — CBC
HCT: 36.3 % — ABNORMAL LOW (ref 39.0–52.0)
Hemoglobin: 11.9 g/dL — ABNORMAL LOW (ref 13.0–17.0)
MCH: 30.4 pg (ref 26.0–34.0)
MCHC: 32.8 g/dL (ref 30.0–36.0)
MCV: 92.8 fL (ref 80.0–100.0)
Platelets: 233 10*3/uL (ref 150–400)
RBC: 3.91 MIL/uL — ABNORMAL LOW (ref 4.22–5.81)
RDW: 12.7 % (ref 11.5–15.5)
WBC: 7.6 10*3/uL (ref 4.0–10.5)
nRBC: 0 % (ref 0.0–0.2)

## 2023-03-27 MED ORDER — DOXAZOSIN MESYLATE 1 MG PO TABS: 1.0000 mg | ORAL_TABLET | Freq: Every day | ORAL | 0 refills | Status: DC

## 2023-03-27 MED ORDER — ATORVASTATIN CALCIUM 80 MG PO TABS: ORAL_TABLET | ORAL | 3 refills | Status: DC

## 2023-03-27 MED ORDER — AMLODIPINE BESYLATE 10 MG PO TABS: 10.0000 mg | ORAL_TABLET | Freq: Every day | ORAL | 0 refills | Status: DC

## 2023-03-27 MED ORDER — METOPROLOL SUCCINATE ER 50 MG PO TB24: 50.0000 mg | ORAL_TABLET | Freq: Every day | ORAL | 0 refills | Status: DC

## 2023-03-27 NOTE — Telephone Encounter (Signed)
*  STAT* If patient is at the pharmacy, call can be transferred to refill team.   1. Which medications need to be refilled? (please list name of each medication and dose if known) metoprolol 50 mg 24 hr tablet Doxazosin 1 mg tablet Amlodipine 10 mg tablet Atorvastatin 80 mg tablet   2. Would you like to learn more about the convenience, safety, & potential cost savings by using the [facility]? YES     3. Are you open to using the [facility] (Type [facility]. [facility] ).   4. Which pharmacy/location (including street and city if local pharmacy) is medication to be sent to?[facility] [place]   5. Do they need a 30 day or 90 day supply? 90

## 2023-03-27 NOTE — Telephone Encounter (Signed)
Requested Prescriptions   Signed Prescriptions Disp Refills   metoprolol succinate (TOPROL-XL) 50 MG 24 hr tablet 30 tablet 0    Sig: Take 1 tablet (50 mg total) by mouth daily. Take with or immediately following a meal.    Authorizing Provider: Debbe Odea    Ordering User: Margrett Rud   doxazosin (CARDURA) 1 MG tablet 30 tablet 0    Sig: Take 1 tablet (1 mg total) by mouth daily.    Authorizing Provider: Debbe Odea    Ordering User: Margrett Rud   amLODipine (NORVASC) 10 MG tablet 30 tablet 0    Sig: Take 1 tablet (10 mg total) by mouth daily. PLEASE SCHEDULE OFFICE VISIT FOR FURTHER REFILLS. THANK YOU!    Authorizing Provider: Debbe Odea    Ordering User: Margrett Rud   atorvastatin (LIPITOR) 80 MG tablet 90 tablet 3    Sig: TAKE 1 TABLET(80 MG) BY MOUTH DAILY    Authorizing Provider: Debbe Odea    Ordering User: Margrett Rud

## 2023-03-27 NOTE — Addendum Note (Signed)
Addended by: Guerry Minors on: 03/27/2023 04:29 PM   Modules accepted: Orders

## 2023-03-27 NOTE — Telephone Encounter (Signed)
Requested Prescriptions   Signed Prescriptions Disp Refills   doxazosin (CARDURA) 1 MG tablet 90 tablet 0    Sig: Take 1 tablet (1 mg total) by mouth daily. Must keep 04/2023 visit for future refills.    Authorizing Provider: Antonieta Iba    Ordering User: Feliberto Harts L   amLODipine (NORVASC) 10 MG tablet 90 tablet 0    Sig: Take 1 tablet (10 mg total) by mouth daily. Must keep 04/2023 visit for future refills.    Authorizing Provider: Debbe Odea    Ordering User: Guerry Minors

## 2023-03-27 NOTE — Telephone Encounter (Signed)
Requested Prescriptions   Signed Prescriptions Disp Refills   doxazosin (CARDURA) 1 MG tablet 90 tablet 0    Sig: Take 1 tablet (1 mg total) by mouth daily. Must keep 04/2023 visit for future refills.    Authorizing Provider: Antonieta Iba    Ordering User: Guerry Minors     Patient requesting 90 day supply.  Last visit 02/21/22 with yearly f/u plan.  Next visit is scheduled on 05/13/23.  Will make note on rx pt must keep 04/2023 appt for future refills.

## 2023-03-28 ENCOUNTER — Ambulatory Visit: Payer: Medicare HMO | Admitting: Surgery

## 2023-04-04 ENCOUNTER — Ambulatory Visit: Payer: Medicare HMO | Admitting: Surgery

## 2023-04-04 ENCOUNTER — Encounter: Payer: Self-pay | Admitting: Surgery

## 2023-04-04 VITALS — BP 124/71 | HR 66 | Temp 97.8°F | Ht 66.0 in | Wt 157.6 lb

## 2023-04-04 DIAGNOSIS — K3532 Acute appendicitis with perforation and localized peritonitis, without abscess: Secondary | ICD-10-CM

## 2023-04-04 DIAGNOSIS — K3533 Acute appendicitis with perforation and localized peritonitis, with abscess: Secondary | ICD-10-CM

## 2023-04-04 NOTE — Progress Notes (Signed)
Patient ID: [name], male   DOB: [birth year], 77 y.o.   MRN: [id number]  Chief Complaint: Right lower quadrant drain for perforated appendicitis with abscess.  History of Present Illness [name] returns in follow-up, his white blood cell count following his drain removal was normal, he did have a CT scan showing some persistence of a small fluid collection though smaller despite the fact that the catheter had been removed.  He presents today with no complaints.  Denies right lower quadrant pain, has had no nausea or vomiting, has had no fevers or chills.  We discussed the role of repeat CT imaging, risks of recurrent appendicitis or right lower quadrant infectious process.  We also discussed the role of interval appendectomy and the pros and cons thereof.  At the present time he desires to not intervene any further, but would comply with repeat CT imaging in approximately a month or as needed within the interval.  [name] is a 77 y.o. male with nearing 2 weeks following discharge, was discharged with Augmentin, but did not initiate taking it as soon as they obtained it.  Therefore he is still taking it now.  Drain output though has been minimal to none, he denies any fevers chills.  He reports some degree of anorexia, fatigue and malaise.  Past Medical History Past Medical History:  Diagnosis Date   Alcohol abuse, unspecified    CAD (coronary artery disease) 04/20/2005   CABG x4   Colon polyps    Depression    DMII (diabetes mellitus, type 2) (HCC) 05/20/2002   HLD (hyperlipidemia) 05/20/2002   HTN (hypertension) 05/20/2002   Motion sickness    planes, deep sea fishing      Past Surgical History:  Procedure Laterality Date   CATARACT EXTRACTION W/PHACO Right 04/12/2022   Procedure: CATARACT EXTRACTION PHACO AND INTRAOCULAR LENS PLACEMENT (IOC) RIGHT MALYUGIN OMIDRIA IRIS HOOKS;  Surgeon: Estanislado Pandy, MD;  Location: [facility];  Service: Ophthalmology;   Laterality: Right;  16.16 1:33.8   CATARACT EXTRACTION W/PHACO Left 04/26/2022   Procedure: CATARACT EXTRACTION PHACO AND INTRAOCULAR LENS PLACEMENT (IOC) LEFT MALYUGIN OMIDIRA IRIS HOOKS;  Surgeon: Estanislado Pandy, MD;  Location: [facility];  Service: Ophthalmology;  Laterality: Left;  14.02 2:13.8   COLONOSCOPY     CORONARY ARTERY BYPASS GRAFT  9/06   x4   ESOPHAGOGASTRODUODENOSCOPY  7/07   polyp; mucosal abnml   IR RADIOLOGIST EVAL & MGMT  03/26/2023   TONSILLECTOMY      Allergies  Allergen Reactions   Sulfonamide Derivatives     REACTION: unknown reaction    Current Outpatient Medications  Medication Sig Dispense Refill   ACCU-CHEK SOFTCLIX LANCETS lancets Check blood sugar once daily and as instructed. Dx E11.65 100 each 3   amLODipine (NORVASC) 10 MG tablet Take 1 tablet (10 mg total) by mouth daily. Must keep 04/2023 visit for future refills. 90 tablet 0   aspirin 81 MG tablet Take 1 tablet (81 mg total) by mouth daily.     atorvastatin (LIPITOR) 80 MG tablet TAKE 1 TABLET(80 MG) BY MOUTH DAILY 90 tablet 3   Blood Glucose Monitoring Suppl (ACCU-CHEK AVIVA PLUS) w/Device KIT Check blood sugar once daily and as instructed. Dx E11.65 1 kit 0   calcium carbonate (TUMS - DOSED IN MG ELEMENTAL CALCIUM) 500 MG chewable tablet Chew 1 tablet by mouth daily as needed for indigestion or heartburn.     doxazosin (CARDURA) 1 MG tablet Take 1  tablet (1 mg total) by mouth daily. Must keep 04/2023 visit for future refills. 90 tablet 0   fish oil-omega-3 fatty acids 1000 MG capsule Take 2 g by mouth daily.     glucose blood (ACCU-CHEK AVIVA PLUS) test strip Check blood sugar once daily and as instructed. Dx E11.65 100 each 3   losartan (COZAAR) 100 MG tablet TAKE 1 TABLET(100 MG) BY MOUTH DAILY (Patient taking differently: Take 100 mg by mouth daily. TAKE 1 TABLET(100 MG) BY MOUTH DAILY) 90 tablet 0   metFORMIN (GLUCOPHAGE-XR) 500 MG 24 hr tablet TAKE 4 TABLETS BY MOUTH EVERY DAY WITH  BREAKFAST 360 tablet 3   metoprolol succinate (TOPROL-XL) 50 MG 24 hr tablet Take 1 tablet (50 mg total) by mouth daily. Take with or immediately following a meal. 30 tablet 0   Multiple Vitamin (MULTIVITAMIN) tablet Take 1 tablet by mouth daily.     ondansetron (ZOFRAN-ODT) 4 MG disintegrating tablet Take 1 tablet (4 mg total) by mouth every 6 (six) hours as needed for nausea. 20 tablet 0   sodium chloride flush 0.9 % SOLN injection 5 mLs by Intracatheter route daily. 100 mL 2   No current facility-administered medications for this visit.    Family History Family History  Problem Relation Age of Onset   Alcohol abuse Father    Stroke Father    Other Father        cardiac problems   Heart disease Father    COPD Mother    Alcohol abuse Brother    Cirrhosis Brother        EtOH   Anxiety disorder Brother    Heart disease Paternal Grandmother    Colon cancer Neg Hx    Esophageal cancer Neg Hx    Rectal cancer Neg Hx    Stomach cancer Neg Hx       Social History Social History   Tobacco Use   Smoking status: Former   Smokeless tobacco: Never   Tobacco comments:    quit 41 years ago  Vaping Use   Vaping status: Never Used  Substance Use Topics   Alcohol use: Yes    Alcohol/week: 17.0 standard drinks of alcohol    Types: 2 Glasses of wine, 15 Shots of liquor per week    Comment: 2 glasses of wine or 3-4 shots of bourbon daily   Drug use: No       Physical Exam Blood pressure 124/71, pulse 66, temperature 97.8 F (36.6 C), temperature source Oral, height 5\' 6"  (1.676 m), weight 157 lb 9.6 oz (71.5 kg), SpO2 98%. Last Weight  Most recent update: 04/04/2023 10:30 AM    Weight  71.5 kg (157 lb 9.6 oz)             CONSTITUTIONAL: Well developed, and nourished, appropriately responsive and aware without distress.   EYES: Sclera non-icteric.   EARS, NOSE, MOUTH AND THROAT:  The oropharynx is clear. Oral mucosa is pink and moist.     Hearing is intact to voice.  NECK:  Trachea is midline, and there is no jugular venous distension.  LYMPH NODES:  Lymph nodes in the neck are not appreciated. RESPIRATORY:   Normal respiratory effort without pathologic use of accessory muscles. CARDIOVASCULAR:  Well perfused.   GI: The abdomen is soft, nontender, and nondistended. There were no palpable masses.    MUSCULOSKELETAL:  Symmetrical muscle tone appreciated in all four extremities.    SKIN: Skin turgor is normal. No pathologic skin lesions  appreciated.  NEUROLOGIC:  Motor and sensation appear grossly normal.  Cranial nerves are grossly without defect. PSYCH:  Alert and oriented to person, place and time. Affect is appropriate for situation.  Data Reviewed I have personally reviewed what is currently available of the patient's imaging, recent labs and medical records.   Labs:     Latest Ref Rng & Units 03/27/2023    2:03 PM 03/19/2023   10:52 AM 03/06/2023    5:52 AM  CBC  WBC 4.0 - 10.5 K/uL 7.6  7.5  8.7   Hemoglobin 13.0 - 17.0 g/dL 95.2  84.1  32.4   Hematocrit 39.0 - 52.0 % 36.3  39.0  35.3   Platelets 150 - 400 K/uL 233  350  203       Latest Ref Rng & Units 03/06/2023    5:52 AM 03/05/2023    6:30 AM 03/04/2023    5:30 AM  CMP  Glucose 70 - 99 mg/dL 401  027  253   BUN 8 - 23 mg/dL 9  9  12    Creatinine 0.61 - 1.24 mg/dL 6.64  4.03  4.74   Sodium 135 - 145 mmol/L 135  135  134   Potassium 3.5 - 5.1 mmol/L 3.8  3.6  3.8   Chloride 98 - 111 mmol/L 105  103  105   CO2 22 - 32 mmol/L 20  20  22    Calcium 8.9 - 10.3 mg/dL 7.9  7.8  8.3       Imaging: Radiological images reviewed:  CLINICAL DATA:  Follow-up perforated appendicitis and abscess. Previous percutaneous catheter drainage.   EXAM: CT ABDOMEN AND PELVIS WITH CONTRAST   TECHNIQUE: Multidetector CT imaging of the abdomen and pelvis was performed using the standard protocol following bolus administration of intravenous contrast.   RADIATION DOSE REDUCTION: This exam was performed  according to the departmental dose-optimization program which includes automated exposure control, adjustment of the mA and/or kV according to patient size and/or use of iterative reconstruction technique.   CONTRAST:  ISOVUE-300 IOPAMIDOL (ISOVUE-300) INJECTION 61%   COMPARISON:  03/04/2023   FINDINGS: Lower Chest: No acute findings.   Hepatobiliary: No suspicious hepatic masses identified. Gallstones are seen, however there is no evidence of cholecystitis or biliary dilatation.   Pancreas:  No mass or inflammatory changes.   Spleen: Within normal limits in size and appearance.   Adrenals/Urinary Tract: No suspicious masses identified. No evidence of ureteral calculi or hydronephrosis. Increased diffuse bladder wall thickening is seen, which may be due to cystitis or chronic bladder outlet obstruction.   Stomach/Bowel: Rim enhancing fluid collections in the right lower quadrant currently measures 4.4 x 2.4 cm, compared to 4.8 x 2.6 cm previously. Surrounding inflammatory changes show no significant change. Wall thickening of adjacent distal ileum and right colon has significantly decreased since previous study.   Vascular/Lymphatic: No pathologically enlarged lymph nodes. No acute vascular findings.   Reproductive:  Stable mildly enlarged prostate.   Other:  None.   Musculoskeletal:  No suspicious bone lesions identified.   IMPRESSION: Slight decrease in size of right lower quadrant abscess.   Decreased wall thickening of adjacent distal ileum and right colon.   Increased diffuse bladder wall thickening, which may be due to cystitis or chronic bladder outlet obstruction. Suggest correlation with urinalysis.   Stable mildly enlarged prostate.   Cholelithiasis. No radiographic evidence of cholecystitis.     Electronically Signed   By: Dietrich Pates.D.  On: 03/26/2023 16:49 Within last 24 hrs: No results found.  Assessment    Persistent residual  fluid collection, following drain placement, unfortunately with premature drain displacement.  Clinically he continues to do beyond expectations with no evidence of persistent infection. Patient Active Problem List   Diagnosis Date Noted   Perforated appendicitis 03/02/2023   Acute perforated appendicitis 03/02/2023   CAD (coronary artery disease), autologous vein bypass graft 10/11/2011   Essential hypertension    Alcohol abuse 08/10/2010   Diabetes mellitus treated with oral medication (HCC) 04/13/2009   Hyperlipidemia LDL goal <70 05/13/2008   History of colonic polyps 03/12/2007   DEPRESSION 12/03/2006    Plan    Options reviewed above.  I am concerned that he may have a recurrent infectious process, but at this point in time he is doing so well he wishes not to undergo another CT scan.  He is a reliable patient and I suspect he will let me know as soon as he has increased abdominal pain, or recurrent fevers or chills.  Will be quick to reaspirate or replace drain as needed.   Face-to-face time spent with the patient and accompanying care providers(if present) was 20 minutes, with more than 50% of the time spent counseling, educating, and coordinating care of the patient.    These notes generated with voice recognition software. I apologize for typographical errors.  Campbell Lerner M.D., FACS 04/04/2023, 10:34 AM

## 2023-04-04 NOTE — Patient Instructions (Addendum)
If you have any concerns or questions, please feel free to call our office. See follow up appointment.   Appendicitis, Adult  The appendix is a tube in the body that is shaped like a finger. It is attached to the large intestine. Appendicitis means that this tube is swollen (inflamed). If this is not treated, the tube can tear. This can lead to a life-threatening infection. This condition can also cause pus to build up in the appendix. What are the causes? Something blocking the appendix, such as: A ball of poop (stool). Lymph glands that are bigger than normal. Injury to the belly (abdomen). Sometimes the cause is not known. What increases the risk? You are more likely to get this condition if you are 28-49 years old. What are the signs or symptoms? Pain or tenderness that starts around the belly button and: Moves toward the lower right belly. Gets worse with time. Gets worse if you cough. Gets worse if you make a sudden move. Vomiting or feeling like you may vomit (nauseous). Not wanting to eat as much as normal (loss of appetite). A fever. Trouble pooping (constipation). Watery poop (diarrhea). Feel generally sick (malaise). How is this treated? In general, this condition is treated with both antibiotic medicines and surgery to take out the appendix. If only antibiotics are given and the appendix is not taken out, there is a chance the condition could come back. There are two ways to take out the appendix: Open surgery. For this method, the appendix is taken out through a large cut. This cut is called an incision and is made in the lower right belly. This surgery may be used if: You have scars from another surgery. You have a bleeding condition. You are pregnant and will be having your baby soon. You have a condition that makes it hard to do the other type of surgery. Laparoscopic surgery. For this method, the appendix is taken out through small cuts. Often, this surgery: Causes  less pain. Causes fewer problems. Heals faster. If your appendix tears and pus forms: A drain may be put into the sore. The drain will be used to get rid of the pus. You may get an antibiotic through an IV tube. Your appendix may or may not need to be taken out. Follow these instructions at home: If you had surgery: Follow instructions from your doctor on how to: Care for yourself at home. Take care of your cut from surgery. Medicines Take over-the-counter and prescription medicines only as told by your doctor. If you were prescribed an antibiotic medicine, take it as told by your doctor. Do not stop taking it even if you start to feel better. If told, take steps to prevent problems with pooping (constipation). You may need to: Drink enough fluid to keep your pee (urine) pale yellow. Take medicines. You will be told what medicines to take. Eat foods that are high in fiber. These include beans, whole grains, and fresh fruits and vegetables. Limit foods that are high in fat and sugar. These include fried or sweet foods. Ask your doctor if you should avoid driving or using machines while you are taking your medicine. General instructions Follow instructions from your doctor about what you cannot eat or drink. Do not smoke or use any products that contain nicotine or tobacco. If you need help quitting, ask your doctor. Return to your normal activities when your doctor says that it is safe. Keep all follow-up visits. Contact a doctor if: There is  pus, blood, or a lot of fluid coming from your cut or cuts from surgery. You feel like you may vomit, or you vomit. You have a fever. You are very tired. You have muscle pain. Get help right away if: You have pain in your belly, and the pain is getting worse. You are short of breath. You cannot stop vomiting. These symptoms may be an emergency. Get help right away. Call your local emergency services (911 in the [place] Do not wait to see if  the symptoms will go away. Do not drive yourself to the hospital. Summary Appendicitis is swelling of the appendix. The appendix is a tube that is shaped like a finger. It is joined to the large intestine. This condition may be caused by something that blocks the appendix. This can lead to an infection. This condition is most often treated with both antibiotic medicines and taking out the appendix. This information is not intended to replace advice given to you by your health care provider. Make sure you discuss any questions you have with your health care provider. Document Revised: 01/26/2021 Document Reviewed: 01/26/2021 Elsevier Patient Education  2024 [employer].

## 2023-04-05 ENCOUNTER — Encounter: Payer: Self-pay | Admitting: Surgery

## 2023-04-09 ENCOUNTER — Other Ambulatory Visit: Payer: Self-pay | Admitting: Family Medicine

## 2023-04-10 ENCOUNTER — Telehealth: Payer: Self-pay | Admitting: Cardiovascular Disease

## 2023-04-10 MED ORDER — AMLODIPINE BESYLATE 10 MG PO TABS: 10.0000 mg | ORAL_TABLET | Freq: Every day | ORAL | 0 refills | Status: DC

## 2023-04-10 NOTE — Telephone Encounter (Signed)
*  STAT* If patient is at the pharmacy, call can be transferred to refill team.   1. Which medications need to be refilled? (please list name of each medication and dose if known) Amlodipine   2. Would you like to learn more about the convenience, safety, & potential cost savings by using the [facility]?      3. Are you open to using the [facility] (Type [facility]. .   4. Which pharmacy/location (including street and city if local pharmacy) is medication to be sent to?[facility] [address] [place]   5. Do they need a 30 day or 90 day supply? #90 days  and refills , if not enough his appointment on 05-13-23- please call in today- he does not have any medicine

## 2023-04-10 NOTE — Telephone Encounter (Signed)
Requested Prescriptions   Signed Prescriptions Disp Refills   amLODipine (NORVASC) 10 MG tablet 90 tablet 0    Sig: Take 1 tablet (10 mg total) by mouth daily.    Authorizing Provider: Minna Merritts    Ordering User: Britt Bottom

## 2023-05-09 ENCOUNTER — Ambulatory Visit: Payer: Medicare HMO | Admitting: Surgery

## 2023-05-09 ENCOUNTER — Encounter: Payer: Self-pay | Admitting: Surgery

## 2023-05-09 VITALS — BP 140/76 | HR 61 | Temp 98.5°F | Ht 66.0 in | Wt 163.8 lb

## 2023-05-09 DIAGNOSIS — Z8719 Personal history of other diseases of the digestive system: Secondary | ICD-10-CM | POA: Insufficient documentation

## 2023-05-09 DIAGNOSIS — K3533 Acute appendicitis with perforation and localized peritonitis, with abscess: Secondary | ICD-10-CM

## 2023-05-09 NOTE — Progress Notes (Signed)
Patient ID: [name], male   DOB: August 09, 1946, 77 y.o.   MRN: [id number]  Chief Complaint: Follow-up after completed drainage for perforated appendicitis.  History of present illness: This patient is continuing to do extraordinarily well now 6 weeks after his last CT scan.  He had an episode of late evening pain that lasted perhaps 3 hours into the night.  It is very possible this might have been an episode of biliary colic.  He has had no repeated pain since.  Denies fevers, chills, denies vomiting, nausea or abdominal pain. His last colon screening has been by Cologuard.  He is probably due for a follow-up colonoscopy, at current not wishing to pursue any additional workup.  We discussed the role of CT scan to evaluate/confirm resolution of prior issues.  Once again he is doing so well he desires to defer this for now as well.  And we discussed the potential role for repeat colonoscopy. At present he is doing so well he would just assume continue close observation.  We discussed his cholelithiasis and the potential for it becoming increasingly symptomatic with time.  I believe he understands we reviewed his potential presentation for symptoms in the future, and I believe he will keep ready contact with [place] should anything change.  At present the foods that provoke biliary colic are things he avoids anyway.  Past Medical History Past Medical History:  Diagnosis Date   Acute perforated appendicitis 03/02/2023   Alcohol abuse, unspecified    CAD (coronary artery disease) 04/20/2005   CABG x4   Colon polyps    Depression    DMII (diabetes mellitus, type 2) (HCC) 05/20/2002   HLD (hyperlipidemia) 05/20/2002   HTN (hypertension) 05/20/2002   Motion sickness    planes, deep sea fishing      Past Surgical History:  Procedure Laterality Date   CATARACT EXTRACTION W/PHACO Right 04/12/2022   Procedure: CATARACT EXTRACTION PHACO AND INTRAOCULAR LENS PLACEMENT (IOC) RIGHT MALYUGIN OMIDRIA IRIS  HOOKS;  Surgeon: Estanislado Pandy, MD;  Location: [facility];  Service: Ophthalmology;  Laterality: Right;  16.16 1:33.8   CATARACT EXTRACTION W/PHACO Left 04/26/2022   Procedure: CATARACT EXTRACTION PHACO AND INTRAOCULAR LENS PLACEMENT (IOC) LEFT MALYUGIN OMIDIRA IRIS HOOKS;  Surgeon: Estanislado Pandy, MD;  Location: [facility];  Service: Ophthalmology;  Laterality: Left;  14.02 2:13.8   COLONOSCOPY     CORONARY ARTERY BYPASS GRAFT  9/06   x4   ESOPHAGOGASTRODUODENOSCOPY  7/07   polyp; mucosal abnml   IR RADIOLOGIST EVAL & MGMT  03/26/2023   TONSILLECTOMY      Allergies  Allergen Reactions   Sulfonamide Derivatives     REACTION: unknown reaction    Current Outpatient Medications  Medication Sig Dispense Refill   ACCU-CHEK SOFTCLIX LANCETS lancets Check blood sugar once daily and as instructed. Dx E11.65 100 each 3   amLODipine (NORVASC) 10 MG tablet Take 1 tablet (10 mg total) by mouth daily. 90 tablet 0   aspirin 81 MG tablet Take 1 tablet (81 mg total) by mouth daily.     atorvastatin (LIPITOR) 80 MG tablet TAKE 1 TABLET(80 MG) BY MOUTH DAILY 90 tablet 3   Blood Glucose Monitoring Suppl (ACCU-CHEK AVIVA PLUS) w/Device KIT Check blood sugar once daily and as instructed. Dx E11.65 1 kit 0   calcium carbonate (TUMS - DOSED IN MG ELEMENTAL CALCIUM) 500 MG chewable tablet Chew 1 tablet by mouth daily as needed for indigestion or heartburn.  doxazosin (CARDURA) 1 MG tablet Take 1 tablet (1 mg total) by mouth daily. Must keep 04/2023 visit for future refills. 90 tablet 0   fish oil-omega-3 fatty acids 1000 MG capsule Take 2 g by mouth daily.     glucose blood (ACCU-CHEK AVIVA PLUS) test strip Check blood sugar once daily and as instructed. Dx E11.65 100 each 3   losartan (COZAAR) 100 MG tablet TAKE 1 TABLET(100 MG) BY MOUTH DAILY 90 tablet 1   metFORMIN (GLUCOPHAGE-XR) 500 MG 24 hr tablet TAKE 4 TABLETS BY MOUTH EVERY DAY WITH BREAKFAST 360 tablet 3    metoprolol succinate (TOPROL-XL) 50 MG 24 hr tablet Take 1 tablet (50 mg total) by mouth daily. Take with or immediately following a meal. 30 tablet 0   Multiple Vitamin (MULTIVITAMIN) tablet Take 1 tablet by mouth daily.     ondansetron (ZOFRAN-ODT) 4 MG disintegrating tablet Take 1 tablet (4 mg total) by mouth every 6 (six) hours as needed for nausea. 20 tablet 0   sodium chloride flush 0.9 % SOLN injection 5 mLs by Intracatheter route daily. 100 mL 2   No current facility-administered medications for this visit.    Family History Family History  Problem Relation Age of Onset   Alcohol abuse Father    Stroke Father    Other Father        cardiac problems   Heart disease Father    COPD Mother    Alcohol abuse Brother    Cirrhosis Brother        EtOH   Anxiety disorder Brother    Heart disease Paternal Grandmother    Colon cancer Neg Hx    Esophageal cancer Neg Hx    Rectal cancer Neg Hx    Stomach cancer Neg Hx       Social History Social History   Tobacco Use   Smoking status: Former   Smokeless tobacco: Never   Tobacco comments:    quit 41 years ago  Vaping Use   Vaping status: Never Used  Substance Use Topics   Alcohol use: Yes    Alcohol/week: 17.0 standard drinks of alcohol    Types: 2 Glasses of wine, 15 Shots of liquor per week    Comment: 2 glasses of wine or 3-4 shots of bourbon daily   Drug use: No       Physical Exam Blood pressure (!) 140/76, pulse 61, temperature 98.5 F (36.9 C), temperature source Oral, height 5\' 6"  (1.676 m), weight 163 lb 12.8 oz (74.3 kg), SpO2 99%. Last Weight  Most recent update: 05/09/2023  9:28 AM    Weight  74.3 kg (163 lb 12.8 oz)             CONSTITUTIONAL: Well developed, and nourished, appropriately responsive and aware without distress.   EYES: Sclera non-icteric.   EARS, NOSE, MOUTH AND THROAT:  The oropharynx is clear. Oral mucosa is pink and moist.     Hearing is intact to voice.  NECK: Trachea is midline,  and there is no jugular venous distension.  LYMPH NODES:  Lymph nodes in the neck are not appreciated. RESPIRATORY:   Normal respiratory effort without pathologic use of accessory muscles. CARDIOVASCULAR:  Well perfused.   GI: The abdomen is soft, nontender, and nondistended. There were no palpable masses.  No right upper quadrant tenderness, no right lower quadrant tenderness.  MUSCULOSKELETAL:  Symmetrical muscle tone appreciated in all four extremities.    SKIN: Skin turgor is normal. No pathologic  skin lesions appreciated.  NEUROLOGIC:  Motor and sensation appear grossly normal.  Cranial nerves are grossly without defect. PSYCH:  Alert and oriented to person, place and time. Affect is appropriate for situation.  Data Reviewed I have personally reviewed what is currently available of the patient's imaging, recent labs and medical records.   Labs:     Latest Ref Rng & Units 03/27/2023    2:03 PM 03/19/2023   10:52 AM 03/06/2023    5:52 AM  CBC  WBC 4.0 - 10.5 K/uL 7.6  7.5  8.7   Hemoglobin 13.0 - 17.0 g/dL 81.1  91.4  78.2   Hematocrit 39.0 - 52.0 % 36.3  39.0  35.3   Platelets 150 - 400 K/uL 233  350  203       Latest Ref Rng & Units 03/06/2023    5:52 AM 03/05/2023    6:30 AM 03/04/2023    5:30 AM  CMP  Glucose 70 - 99 mg/dL 956  213  086   BUN 8 - 23 mg/dL 9  9  12    Creatinine 0.61 - 1.24 mg/dL 5.78  4.69  6.29   Sodium 135 - 145 mmol/L 135  135  134   Potassium 3.5 - 5.1 mmol/L 3.8  3.6  3.8   Chloride 98 - 111 mmol/L 105  103  105   CO2 22 - 32 mmol/L 20  20  22    Calcium 8.9 - 10.3 mg/dL 7.9  7.8  8.3       Imaging: Radiological images reviewed:  CLINICAL DATA:  Follow-up perforated appendicitis and abscess. Previous percutaneous catheter drainage.   EXAM: CT ABDOMEN AND PELVIS WITH CONTRAST   TECHNIQUE: Multidetector CT imaging of the abdomen and pelvis was performed using the standard protocol following bolus administration of intravenous contrast.    RADIATION DOSE REDUCTION: This exam was performed according to the departmental dose-optimization program which includes automated exposure control, adjustment of the mA and/or kV according to patient size and/or use of iterative reconstruction technique.   CONTRAST:  ISOVUE-300 IOPAMIDOL (ISOVUE-300) INJECTION 61%   COMPARISON:  03/04/2023   FINDINGS: Lower Chest: No acute findings.   Hepatobiliary: No suspicious hepatic masses identified. Gallstones are seen, however there is no evidence of cholecystitis or biliary dilatation.   Pancreas:  No mass or inflammatory changes.   Spleen: Within normal limits in size and appearance.   Adrenals/Urinary Tract: No suspicious masses identified. No evidence of ureteral calculi or hydronephrosis. Increased diffuse bladder wall thickening is seen, which may be due to cystitis or chronic bladder outlet obstruction.   Stomach/Bowel: Rim enhancing fluid collections in the right lower quadrant currently measures 4.4 x 2.4 cm, compared to 4.8 x 2.6 cm previously. Surrounding inflammatory changes show no significant change. Wall thickening of adjacent distal ileum and right colon has significantly decreased since previous study.   Vascular/Lymphatic: No pathologically enlarged lymph nodes. No acute vascular findings.   Reproductive:  Stable mildly enlarged prostate.   Other:  None.   Musculoskeletal:  No suspicious bone lesions identified.   IMPRESSION: Slight decrease in size of right lower quadrant abscess.   Decreased wall thickening of adjacent distal ileum and right colon.   Increased diffuse bladder wall thickening, which may be due to cystitis or chronic bladder outlet obstruction. Suggest correlation with urinalysis.   Stable mildly enlarged prostate.   Cholelithiasis. No radiographic evidence of cholecystitis.     Electronically Signed   By: Dietrich Pates.D.  On: 03/26/2023 16:49 Within last 24 hrs: No  results found.  Assessment    Clinically he continues to do beyond expectations with no evidence of persistent infection. Status post perforated appendicitis, successful percutaneous drainage with treatment by antibiotics alone.  Patient Active Problem List   Diagnosis Date Noted   Perforated appendicitis 03/02/2023   CAD (coronary artery disease), autologous vein bypass graft 10/11/2011   Essential hypertension    Alcohol abuse 08/10/2010   Diabetes mellitus treated with oral medication (HCC) 04/13/2009   Hyperlipidemia LDL goal <70 05/13/2008   History of colonic polyps 03/12/2007   DEPRESSION 12/03/2006    Plan    Options reviewed again.   Other etiologies then routine acute appendicitis may be unappreciated without further investigation.  We discussed this in detail, with interventions including repeated Cologuard, repeat colonoscopy, repeat CT scanning, and even ultrasound imaging of his cholelithiasis.  At present he is doing so well and he is so appreciative of his current health status that he is reluctant to get overly aggressive with further investigation.  Therefore I believe continued observation is minimal, and I believe he is reliable to follow-up.  Will anticipate seeing him in 3 months or as needed.  At that time it may be well worthwhile to repeat imaging, or consider other forms of intervention as indicated.  At present he clearly has no evidence of a recurrent infectious process, and at this point in time he is doing so well he wishes not to undergo another CT scan.  He is a reliable patient and I suspect he will let me know as soon as he has increased abdominal pain, or recurrent fevers or chills.  Will be quick to evaluate further as needed.   Face-to-face time spent with the patient and accompanying care providers(if present) was 30 minutes, with more than 50% of the time spent counseling, educating, and coordinating care of the patient.    These notes generated  with voice recognition software. I apologize for typographical errors.  Campbell Lerner M.D., FACS 05/09/2023, 10:37 AM

## 2023-05-09 NOTE — Patient Instructions (Addendum)
If you have any concerns or questions, please feel free to call our office. See follow up appointment.   Appendicitis, Adult  The appendix is a tube in the body that is shaped like a finger. It is attached to the large intestine. Appendicitis means that this tube is swollen (inflamed). If this is not treated, the tube can tear. This can lead to a life-threatening infection. This condition can also cause pus to build up in the appendix. What are the causes? Something blocking the appendix, such as: A ball of poop (stool). Lymph glands that are bigger than normal. Injury to the belly (abdomen). Sometimes the cause is not known. What increases the risk? You are more likely to get this condition if you are 60-65 years old. What are the signs or symptoms? Pain or tenderness that starts around the belly button and: Moves toward the lower right belly. Gets worse with time. Gets worse if you cough. Gets worse if you make a sudden move. Vomiting or feeling like you may vomit (nauseous). Not wanting to eat as much as normal (loss of appetite). A fever. Trouble pooping (constipation). Watery poop (diarrhea). Feel generally sick (malaise). How is this treated? In general, this condition is treated with both antibiotic medicines and surgery to take out the appendix. If only antibiotics are given and the appendix is not taken out, there is a chance the condition could come back. There are two ways to take out the appendix: Open surgery. For this method, the appendix is taken out through a large cut. This cut is called an incision and is made in the lower right belly. This surgery may be used if: You have scars from another surgery. You have a bleeding condition. You are pregnant and will be having your baby soon. You have a condition that makes it hard to do the other type of surgery. Laparoscopic surgery. For this method, the appendix is taken out through small cuts. Often, this surgery: Causes  less pain. Causes fewer problems. Heals faster. If your appendix tears and pus forms: A drain may be put into the sore. The drain will be used to get rid of the pus. You may get an antibiotic through an IV tube. Your appendix may or may not need to be taken out. Follow these instructions at home: If you had surgery: Follow instructions from your doctor on how to: Care for yourself at home. Take care of your cut from surgery. Medicines Take over-the-counter and prescription medicines only as told by your doctor. If you were prescribed an antibiotic medicine, take it as told by your doctor. Do not stop taking it even if you start to feel better. If told, take steps to prevent problems with pooping (constipation). You may need to: Drink enough fluid to keep your pee (urine) pale yellow. Take medicines. You will be told what medicines to take. Eat foods that are high in fiber. These include beans, whole grains, and fresh fruits and vegetables. Limit foods that are high in fat and sugar. These include fried or sweet foods. Ask your doctor if you should avoid driving or using machines while you are taking your medicine. General instructions Follow instructions from your doctor about what you cannot eat or drink. Do not smoke or use any products that contain nicotine or tobacco. If you need help quitting, ask your doctor. Return to your normal activities when your doctor says that it is safe. Keep all follow-up visits. Contact a doctor if: There is  pus, blood, or a lot of fluid coming from your cut or cuts from surgery. You feel like you may vomit, or you vomit. You have a fever. You are very tired. You have muscle pain. Get help right away if: You have pain in your belly, and the pain is getting worse. You are short of breath. You cannot stop vomiting. These symptoms may be an emergency. Get help right away. Call your local emergency services (911 in the [place] Do not wait to see if  the symptoms will go away. Do not drive yourself to the hospital. Summary Appendicitis is swelling of the appendix. The appendix is a tube that is shaped like a finger. It is joined to the large intestine. This condition may be caused by something that blocks the appendix. This can lead to an infection. This condition is most often treated with both antibiotic medicines and taking out the appendix. This information is not intended to replace advice given to you by your health care provider. Make sure you discuss any questions you have with your health care provider. Document Revised: 01/26/2021 Document Reviewed: 01/26/2021 Elsevier Patient Education  2024 [employer].

## 2023-05-12 NOTE — Progress Notes (Unsigned)
Cardiology Office Note  Date:  05/13/2023   ID:  [name], DOB 11-09-1945, MRN [id number]  PCP:  Hannah Beat, MD   Chief Complaint  Patient presents with   12 month follow up     "Doing well." Medications reviewed by the patient verbally.     HPI:  [name] is a very pleasant 77 yo gentleman with remote history of  coronary artery disease, bypass surgery in 2006 x5 vessel  hyperlipidemia,  diabetes, hypertension,  alcohol daily, 2-4 a day who presents for routine followup of his coronary artery disease, hx of CABG  LOV 7/23 Getting over perforated appendix Did ABX, no surgery Had f/u ABD pain, resolved without intervention  Out of cardura, has noticed difficulty with urine flow  No regular exercise program Uses his motorcycle, active on the property Previously reported several glasses of wine per night  Denies chest pain or shortness of breath concerning for angina  Cardiac imaging studies reviewed Echo 1/24 EF 55 to 60%, mild AS  Labs reviewed A1C 7.4 Total chol 128, LDL 66  EKG personally reviewed by myself on todays visit EKG Interpretation Date/Time:  Monday May 13 2023 09:35:37 EDT Ventricular Rate:  54 PR Interval:  182 QRS Duration:  92 QT Interval:  434 QTC Calculation: 411 R Axis:   26  Text Interpretation: Sinus bradycardia When compared with ECG of 10-May-2005 07:10, Abberant conduction is no longer Present Vent. rate has decreased BY  53 BPM ST no longer depressed in Anterior leads Nonspecific T wave abnormality no longer evident in Inferior leads T wave inversion no longer evident in Anterolateral leads Confirmed by [name] [phone]) on 05/13/2023 10:01:52 AM   Other past medical history  labs dated 05/18/2014 showing total cholesterol 126, LDL 62, hemoglobin A1c 7.0 He reports that he is alternating Lipitor 80 mg with 40 mg    He has not had a cardiac catheterization since his bypass surgery   PMH:   has a past medical  history of Acute perforated appendicitis (03/02/2023), Alcohol abuse, unspecified, CAD (coronary artery disease) (04/20/2005), Colon polyps, Depression, DMII (diabetes mellitus, type 2) (HCC) (05/20/2002), HLD (hyperlipidemia) (05/20/2002), HTN (hypertension) (05/20/2002), and Motion sickness.  PSH:    Past Surgical History:  Procedure Laterality Date   CATARACT EXTRACTION W/PHACO Right 04/12/2022   Procedure: CATARACT EXTRACTION PHACO AND INTRAOCULAR LENS PLACEMENT (IOC) RIGHT MALYUGIN OMIDRIA IRIS HOOKS;  Surgeon: Estanislado Pandy, MD;  Location: [facility];  Service: Ophthalmology;  Laterality: Right;  16.16 1:33.8   CATARACT EXTRACTION W/PHACO Left 04/26/2022   Procedure: CATARACT EXTRACTION PHACO AND INTRAOCULAR LENS PLACEMENT (IOC) LEFT MALYUGIN OMIDIRA IRIS HOOKS;  Surgeon: Estanislado Pandy, MD;  Location: [facility];  Service: Ophthalmology;  Laterality: Left;  14.02 2:13.8   COLONOSCOPY     CORONARY ARTERY BYPASS GRAFT  9/06   x4   ESOPHAGOGASTRODUODENOSCOPY  7/07   polyp; mucosal abnml   IR RADIOLOGIST EVAL & MGMT  03/26/2023   TONSILLECTOMY      Current Outpatient Medications  Medication Sig Dispense Refill   amLODipine (NORVASC) 10 MG tablet Take 1 tablet (10 mg total) by mouth daily. 90 tablet 0   aspirin 81 MG tablet Take 1 tablet (81 mg total) by mouth daily.     atorvastatin (LIPITOR) 80 MG tablet TAKE 1 TABLET(80 MG) BY MOUTH DAILY 90 tablet 3   calcium carbonate (TUMS - DOSED IN MG ELEMENTAL CALCIUM) 500 MG chewable tablet Chew 1 tablet by mouth daily  as needed for indigestion or heartburn.     doxazosin (CARDURA) 1 MG tablet Take 1 tablet (1 mg total) by mouth daily. Must keep 04/2023 visit for future refills. 90 tablet 0   fish oil-omega-3 fatty acids 1000 MG capsule Take 2 g by mouth daily.     glucose blood (ACCU-CHEK AVIVA PLUS) test strip Check blood sugar once daily and as instructed. Dx E11.65 100 each 3   losartan (COZAAR) 100 MG tablet  TAKE 1 TABLET(100 MG) BY MOUTH DAILY 90 tablet 1   metFORMIN (GLUCOPHAGE-XR) 500 MG 24 hr tablet TAKE 4 TABLETS BY MOUTH EVERY DAY WITH BREAKFAST 360 tablet 3   metoprolol succinate (TOPROL-XL) 50 MG 24 hr tablet Take 1 tablet (50 mg total) by mouth daily. Take with or immediately following a meal. 30 tablet 0   Multiple Vitamin (MULTIVITAMIN) tablet Take 1 tablet by mouth daily.     ondansetron (ZOFRAN-ODT) 4 MG disintegrating tablet Take 1 tablet (4 mg total) by mouth every 6 (six) hours as needed for nausea. 20 tablet 0   sodium chloride flush 0.9 % SOLN injection 5 mLs by Intracatheter route daily. 100 mL 2   ACCU-CHEK SOFTCLIX LANCETS lancets Check blood sugar once daily and as instructed. Dx E11.65 (Patient not taking: Reported on 05/13/2023) 100 each 3   Blood Glucose Monitoring Suppl (ACCU-CHEK AVIVA PLUS) w/Device KIT Check blood sugar once daily and as instructed. Dx E11.65 (Patient not taking: Reported on 05/13/2023) 1 kit 0   No current facility-administered medications for this visit.    Allergies:   Sulfonamide derivatives   Social History:  The patient  reports that he has quit smoking. He has never used smokeless tobacco. He reports current alcohol use of about 17.0 standard drinks of alcohol per week. He reports that he does not use drugs.   Family History:   family history includes Alcohol abuse in his brother and father; Anxiety disorder in his brother; COPD in his mother; Cirrhosis in his brother; Heart disease in his father and paternal grandmother; Other in his father; Stroke in his father.    Review of Systems: Review of Systems  Constitutional: Negative.   Respiratory: Negative.    Cardiovascular: Negative.   Gastrointestinal: Negative.   Musculoskeletal: Negative.   Neurological: Negative.   Psychiatric/Behavioral: Negative.    All other systems reviewed and are negative.  PHYSICAL EXAM: VS:  BP 130/62 (BP Location: Left Arm, Patient Position: Sitting, Cuff Size:  Normal)   Pulse (!) 54   Ht 5\' 6"  (1.676 m)   Wt 165 lb 2 oz (74.9 kg)   SpO2 98%   BMI 26.65 kg/m  , BMI Body mass index is 26.65 kg/m.  Constitutional:  oriented to person, place, and time. No distress.  HENT:  Head: Grossly normal Eyes:  no discharge. No scleral icterus.  Neck: No JVD, no carotid bruits  Cardiovascular: Regular rate and rhythm, 2/6 systolic ejection murmur right sternal border Pulmonary/Chest: Clear to auscultation bilaterally, no wheezes or rails Abdominal: Soft.  no distension.  no tenderness.  Musculoskeletal: Normal range of motion Neurological:  normal muscle tone. Coordination normal. No atrophy Skin: Skin warm and dry Psychiatric: normal affect, pleasant   Recent Labs: 03/02/2023: ALT 39 03/06/2023: BUN 9; Creatinine, Ser 0.98; Potassium 3.8; Sodium 135 03/27/2023: Hemoglobin 11.9; Platelets 233    Lipid Panel Lab Results  Component Value Date   CHOL 128 02/12/2023   HDL 29.40 (L) 02/12/2023   LDLCALC 48 08/04/2021   TRIG  209.0 (H) 02/12/2023      Wt Readings from Last 3 Encounters:  05/13/23 165 lb 2 oz (74.9 kg)  05/09/23 163 lb 12.8 oz (74.3 kg)  04/04/23 157 lb 9.6 oz (71.5 kg)     ASSESSMENT AND PLAN:   Coronary artery disease involving autologous vein coronary bypass graft without angina pectoris -  2006 bypass surgery,,  Currently with no symptoms of angina. No further workup at this time. Continue current medication regimen. Non-smoker, cholesterol at goal Discussed need to get A1c lower in the 6 range  Essential hypertension - Plan: EKG 12-Lead Blood pressure is well controlled on today's visit. No changes made to the medications.  Hyperlipidemia LDL goal <70 Cholesterol is at goal on the current lipid regimen. No changes to the medications were made.  controlled type 2 diabetes mellitus with other circulatory complication, without long-term current use of insulin (HCC) Weight stable, A1c elevated Reports he likes his [place]  fries  Alcohol abuse Moderation of alcohol recommended  GERD stable  Smoker Quit years ago when he was age 24   Total encounter time more than 30 minutes  Greater than 50% was spent in counseling and coordination of care with the patient     Orders Placed This Encounter  Procedures   EKG 12-Lead     Signed, Dossie Arbour, M.D., Ph.D. 05/13/2023  [facility] [place], [place] [phone]

## 2023-05-13 ENCOUNTER — Encounter: Payer: Self-pay | Admitting: Cardiovascular Disease

## 2023-05-13 ENCOUNTER — Ambulatory Visit: Payer: Medicare HMO | Attending: Cardiovascular Disease | Admitting: Cardiovascular Disease

## 2023-05-13 VITALS — BP 130/62 | HR 54 | Ht 66.0 in | Wt 165.1 lb

## 2023-05-13 DIAGNOSIS — E119 Type 2 diabetes mellitus without complications: Secondary | ICD-10-CM

## 2023-05-13 DIAGNOSIS — I25118 Atherosclerotic heart disease of native coronary artery with other forms of angina pectoris: Secondary | ICD-10-CM

## 2023-05-13 DIAGNOSIS — Z789 Other specified health status: Secondary | ICD-10-CM

## 2023-05-13 DIAGNOSIS — E782 Mixed hyperlipidemia: Secondary | ICD-10-CM

## 2023-05-13 DIAGNOSIS — Z7984 Long term (current) use of oral hypoglycemic drugs: Secondary | ICD-10-CM

## 2023-05-13 DIAGNOSIS — E1165 Type 2 diabetes mellitus with hyperglycemia: Secondary | ICD-10-CM

## 2023-05-13 DIAGNOSIS — I10 Essential (primary) hypertension: Secondary | ICD-10-CM | POA: Diagnosis not present

## 2023-05-13 MED ORDER — METOPROLOL SUCCINATE ER 50 MG PO TB24: 50.0000 mg | ORAL_TABLET | Freq: Every day | ORAL | 3 refills | Status: DC

## 2023-05-13 MED ORDER — AMLODIPINE BESYLATE 10 MG PO TABS: 10.0000 mg | ORAL_TABLET | Freq: Every day | ORAL | 3 refills | Status: DC

## 2023-05-13 MED ORDER — ATORVASTATIN CALCIUM 80 MG PO TABS: ORAL_TABLET | ORAL | 3 refills | Status: DC

## 2023-05-13 MED ORDER — DOXAZOSIN MESYLATE 1 MG PO TABS: 1.0000 mg | ORAL_TABLET | Freq: Every day | ORAL | 3 refills | Status: DC

## 2023-05-13 NOTE — Patient Instructions (Signed)

## 2023-05-21 DIAGNOSIS — E119 Type 2 diabetes mellitus without complications: Secondary | ICD-10-CM | POA: Diagnosis not present

## 2023-05-21 DIAGNOSIS — H52223 Regular astigmatism, bilateral: Secondary | ICD-10-CM | POA: Diagnosis not present

## 2023-05-21 DIAGNOSIS — Z961 Presence of intraocular lens: Secondary | ICD-10-CM | POA: Diagnosis not present

## 2023-05-21 DIAGNOSIS — H04123 Dry eye syndrome of bilateral lacrimal glands: Secondary | ICD-10-CM | POA: Diagnosis not present

## 2023-05-21 DIAGNOSIS — H5203 Hypermetropia, bilateral: Secondary | ICD-10-CM | POA: Diagnosis not present

## 2023-05-21 LAB — HM DIABETES EYE EXAM

## 2023-07-31 ENCOUNTER — Other Ambulatory Visit: Payer: Self-pay | Admitting: Family Medicine

## 2023-07-31 ENCOUNTER — Ambulatory Visit: Payer: Medicare HMO

## 2023-07-31 VITALS — Ht 66.0 in | Wt 165.0 lb

## 2023-07-31 DIAGNOSIS — Z0000 Encounter for general adult medical examination without abnormal findings: Secondary | ICD-10-CM

## 2023-07-31 NOTE — Progress Notes (Signed)
Subjective:   [name] is a 77 y.o. male who presents for Medicare Annual/Subsequent preventive examination.  Visit Complete: Virtual I connected with  [name] on 07/31/23 by a audio enabled telemedicine application and verified that I am speaking with the correct person using two identifiers.  Patient Location: Home  Provider Location: Home Office  I discussed the limitations of evaluation and management by telemedicine. The patient expressed understanding and agreed to proceed.  Vital Signs: Because this visit was a virtual/telehealth visit, some criteria may be missing or patient reported. Any vitals not documented were not able to be obtained and vitals that have been documented are patient reported.  Patient Medicare AWV questionnaire was completed by the patient on 07/25/23; I have confirmed that all information answered by patient is correct and no changes since this date. Cardiac Risk Factors include: advanced age (>107men, >6 women);diabetes mellitus;dyslipidemia;male gender;hypertension    Objective:    Today's Vitals   07/25/23 0859 07/31/23 0849  Weight:  165 lb (74.8 kg)  Height:  5\' 6"  (1.676 m)  PainSc: 2     Body mass index is 26.63 kg/m.     07/31/2023    9:00 AM 03/02/2023    4:00 PM 03/02/2023   11:47 AM 08/08/2022    9:59 AM 04/26/2022   10:39 AM 04/12/2022    8:22 AM 08/02/2021    3:39 PM  Advanced Directives  Does Patient Have a Medical Advance Directive? Yes Yes Yes No No No Yes  Type of Estate agent of [place];Living will Living will Living will    Living will  Does patient want to make changes to medical advance directive?  No - Patient declined     Yes (MAU/Ambulatory/Procedural Areas - Information given)  Copy of Healthcare Power of Attorney in Chart? No - copy requested        Would patient like information on creating a medical advance directive?    No - Patient declined No - Patient declined Yes  (MAU/Ambulatory/Procedural Areas - Information given)     Current Medications (verified) Outpatient Encounter Medications as of 07/31/2023  Medication Sig   ACCU-CHEK SOFTCLIX LANCETS lancets Check blood sugar once daily and as instructed. Dx E11.65   amLODipine (NORVASC) 10 MG tablet Take 1 tablet (10 mg total) by mouth daily.   aspirin 81 MG tablet Take 1 tablet (81 mg total) by mouth daily.   atorvastatin (LIPITOR) 80 MG tablet TAKE 1 TABLET(80 MG) BY MOUTH DAILY   Blood Glucose Monitoring Suppl (ACCU-CHEK AVIVA PLUS) w/Device KIT Check blood sugar once daily and as instructed. Dx E11.65   calcium carbonate (TUMS - DOSED IN MG ELEMENTAL CALCIUM) 500 MG chewable tablet Chew 1 tablet by mouth daily as needed for indigestion or heartburn.   doxazosin (CARDURA) 1 MG tablet Take 1 tablet (1 mg total) by mouth daily.   fish oil-omega-3 fatty acids 1000 MG capsule Take 2 g by mouth daily.   glucose blood (ACCU-CHEK AVIVA PLUS) test strip Check blood sugar once daily and as instructed. Dx E11.65   losartan (COZAAR) 100 MG tablet TAKE 1 TABLET(100 MG) BY MOUTH DAILY   metFORMIN (GLUCOPHAGE-XR) 500 MG 24 hr tablet TAKE 4 TABLETS BY MOUTH EVERY DAY WITH BREAKFAST   metoprolol succinate (TOPROL-XL) 50 MG 24 hr tablet Take 1 tablet (50 mg total) by mouth daily. Take with or immediately following a meal.   Multiple Vitamin (MULTIVITAMIN) tablet Take 1 tablet by mouth daily.  ondansetron (ZOFRAN-ODT) 4 MG disintegrating tablet Take 1 tablet (4 mg total) by mouth every 6 (six) hours as needed for nausea. (Patient not taking: Reported on 07/31/2023)   sodium chloride flush 0.9 % SOLN injection 5 mLs by Intracatheter route daily. (Patient not taking: Reported on 07/31/2023)   No facility-administered encounter medications on file as of 07/31/2023.    Allergies (verified) Sulfonamide derivatives   History: Past Medical History:  Diagnosis Date   Acute perforated appendicitis 03/02/2023   Alcohol  abuse, unspecified    CAD (coronary artery disease) 04/20/2005   CABG x4   Colon polyps    Depression    DMII (diabetes mellitus, type 2) (HCC) 05/20/2002   HLD (hyperlipidemia) 05/20/2002   HTN (hypertension) 05/20/2002   Motion sickness    planes, deep sea fishing   Past Surgical History:  Procedure Laterality Date   CATARACT EXTRACTION W/PHACO Right 04/12/2022   Procedure: CATARACT EXTRACTION PHACO AND INTRAOCULAR LENS PLACEMENT (IOC) RIGHT MALYUGIN OMIDRIA IRIS HOOKS;  Surgeon: Estanislado Pandy, MD;  Location: [facility];  Service: Ophthalmology;  Laterality: Right;  16.16 1:33.8   CATARACT EXTRACTION W/PHACO Left 04/26/2022   Procedure: CATARACT EXTRACTION PHACO AND INTRAOCULAR LENS PLACEMENT (IOC) LEFT MALYUGIN OMIDIRA IRIS HOOKS;  Surgeon: Estanislado Pandy, MD;  Location: [facility];  Service: Ophthalmology;  Laterality: Left;  14.02 2:13.8   COLONOSCOPY     CORONARY ARTERY BYPASS GRAFT  9/06   x4   ESOPHAGOGASTRODUODENOSCOPY  7/07   polyp; mucosal abnml   IR RADIOLOGIST EVAL & MGMT  03/26/2023   TONSILLECTOMY     Family History  Problem Relation Age of Onset   Alcohol abuse Father    Stroke Father    Other Father        cardiac problems   Heart disease Father    COPD Mother    Alcohol abuse Brother    Cirrhosis Brother        EtOH   Anxiety disorder Brother    Heart disease Paternal Grandmother    Colon cancer Neg Hx    Esophageal cancer Neg Hx    Rectal cancer Neg Hx    Stomach cancer Neg Hx    Social History   Socioeconomic History   Marital status: Significant Other    Spouse name: Not on file   Number of children: Not on file   Years of education: Not on file   Highest education level: Not on file  Occupational History   Not on file  Tobacco Use   Smoking status: Former   Smokeless tobacco: Never   Tobacco comments:    quit 41 years ago  Vaping Use   Vaping status: Never Used  Substance and Sexual Activity   Alcohol  use: Yes    Alcohol/week: 17.0 standard drinks of alcohol    Types: 2 Glasses of wine, 15 Shots of liquor per week    Comment: 2 glasses of wine or 3-4 shots of bourbon daily   Drug use: No   Sexual activity: Not on file  Other Topics Concern   Not on file  Social History Narrative   Wife Waynetta Sandy is deceased   He is retired.    Enjoys firearms and recreational shooting.    Social Determinants of Health   Financial Resource Strain: Low Risk  (07/25/2023)   Overall Financial Resource Strain (CARDIA)    Difficulty of Paying Living Expenses: Not hard at all  Food Insecurity: No Food Insecurity (07/25/2023)  Hunger Vital Sign    Worried About Running Out of Food in the Last Year: Never true    Ran Out of Food in the Last Year: Never true  Transportation Needs: No Transportation Needs (07/25/2023)   PRAPARE - Administrator, Civil Service (Medical): No    Lack of Transportation (Non-Medical): No  Physical Activity: Sufficiently Active (07/25/2023)   Exercise Vital Sign    Days of Exercise per Week: 6 days    Minutes of Exercise per Session: 70 min  Stress: No Stress Concern Present (07/25/2023)   [employer] of Occupational Health - Occupational Stress Questionnaire    Feeling of Stress : Only a little  Social Connections: Moderately Integrated (07/25/2023)   Social Connection and Isolation Panel [NHANES]    Frequency of Communication with Friends and Family: Twice a week    Frequency of Social Gatherings with Friends and Family: Twice a week    Attends Religious Services: More than 4 times per year    Active Member of [employer] or Organizations: No    Attends Engineer, structural: Patient declined    Marital Status: Living with partner    Tobacco Counseling Counseling given: Not Answered Tobacco comments: quit 41 years ago   Clinical Intake:  Pre-visit preparation completed: Yes  Pain : 0-10 Pain Score: 2  Pain Type: Chronic pain Pain Location: Hand (rt  and left thumb) Pain Orientation: Right, Left Pain Descriptors / Indicators: Aching Pain Onset: More than a month ago Pain Frequency: Intermittent Pain Relieving Factors: avoid movements that cause pain  Pain Relieving Factors: avoid movements that cause pain  BMI - recorded: 26.63 Nutritional Status: BMI 25 -29 Overweight Nutritional Risks: None Diabetes: Yes CBG done?: Yes (BS 180 this am at home) CBG resulted in Enter/ Edit results?: No Did pt. bring in CBG monitor from home?: No  How often do you need to have someone help you when you read instructions, pamphlets, or other written materials from your doctor or pharmacy?: 1 - Never  Interpreter Needed?: No  Comments: lives with partner Information entered by :: B.Floy Riegler,LPN   Activities of Daily Living    07/25/2023    8:59 AM 03/02/2023    4:00 PM  In your present state of health, do you have any difficulty performing the following activities:  Hearing? 0 0  Vision? 0 0  Difficulty concentrating or making decisions? 0 0  Walking or climbing stairs? 0 0  Dressing or bathing? 0 0  Doing errands, shopping?  0  Preparing Food and eating ? N   Using the Toilet? N   In the past six months, have you accidently leaked urine? N   Do you have problems with loss of bowel control? N   Managing your Medications? N   Managing your Finances? N   Housekeeping or managing your Housekeeping? N     Patient Care Team: Hannah Beat, MD as PCP - General Mariah Milling, Tollie Pizza, MD as PCP - Cardiology (Cardiology) Antonieta Iba, MD as Consulting Physician (Cardiology) Estanislado Pandy, MD as Consulting Physician (Ophthalmology)  Indicate any recent Medical Services you may have received from other than [facility] providers in the past year (date may be approximate).     Assessment:   This is a routine wellness examination for [facility].  Hearing/Vision screen Hearing Screening - Comments:: Pt says his hearing is great Vision  Screening - Comments:: Pt says his vision is &quot;quite remarkable&quot; after cataract surgery Deberah Pelton  Goals Addressed               This Visit's Progress     COMPLETED: Patient Stated   On track     Starting 07/21/2018, I will continue to take medications as prescribed.       COMPLETED: Patient Stated   On track     07/27/2019, I will maintain and continue medications as prescribed.      COMPLETED: Patient Stated   On track     Would like to maintain current routine.       COMPLETED: Weight (lb) < 170 lb (77.1 kg) (pt-stated)   165 lb (74.8 kg)     Portion control meals Stay active Weight goal 160-165lb       Depression Screen    07/31/2023    8:58 AM 12/18/2022    2:47 PM 08/08/2022   10:01 AM 08/02/2021    3:43 PM 07/27/2019    9:58 AM 07/21/2018   12:04 PM 06/13/2017   11:04 AM  PHQ 2/9 Scores  PHQ - 2 Score 0 0 0 0 2 0 0  PHQ- 9 Score  0   2 0     Fall Risk    07/25/2023    8:59 AM 12/18/2022    2:47 PM 08/08/2022   10:00 AM 08/02/2021    3:40 PM 08/04/2020   10:33 AM  Fall Risk   Falls in the past year? 0 0 0 0 0  Number falls in past yr: 0 0 0 0   Injury with Fall? 0 0 0 0   Risk for fall due to : No Fall Risks No Fall Risks No Fall Risks No Fall Risks No Fall Risks  Follow up Education provided;Falls prevention discussed Falls evaluation completed Falls evaluation completed;Falls prevention discussed Falls prevention discussed     MEDICARE RISK AT HOME: Medicare Risk at Home Any stairs in or around the home?: Yes If so, are there any without handrails?: No Home free of loose throw rugs in walkways, pet beds, electrical cords, etc?: Yes Adequate lighting in your home to reduce risk of falls?: Yes Life alert?: No Use of a cane, walker or w/c?: No Grab bars in the bathroom?: No Shower chair or bench in shower?: No Elevated toilet seat or a handicapped toilet?: No  TIMED UP AND GO:  Was the test performed?  No    Cognitive Function:     07/27/2019   10:04 AM 07/21/2018   12:32 PM  MMSE - Mini Mental State Exam  Orientation to time 5 5  Orientation to Place 5 5  Registration 3 3  Attention/ Calculation 3 0  Recall 3 1  Recall-comments  unable to recall 2 of 3 words  Language- name 2 objects  0  Language- repeat 1 1  Language- follow 3 step command  3  Language- read & follow direction  0  Write a sentence  0  Copy design  0  Total score  18        07/31/2023    9:04 AM 08/08/2022   10:18 AM  6CIT Screen  What Year? 0 points 0 points  What month? 0 points 0 points  What time? 0 points 0 points  Count back from 20 0 points 0 points  Months in reverse 0 points 0 points  Repeat phrase 0 points 0 points  Total Score 0 points 0 points    Immunizations Immunization History  Administered Date(s) Administered  Fluad Quad(high Dose 65+) 05/27/2020, 08/07/2021, 08/08/2022   Influenza Split 08/02/2011, 06/11/2012   Influenza Whole 08/10/2010   Influenza, High Dose Seasonal PF 05/18/2014   Influenza,inj,Quad PF,6+ Mos 05/25/2013, 05/30/2015, 06/13/2017   Influenza-Unspecified 05/16/2016   Moderna Sars-Covid-2 Vaccination 11/17/2019, 12/15/2019   Pneumococcal Conjugate-13 05/30/2015   Pneumococcal Polysaccharide-23 10/11/2011   Td 08/20/1993, 04/12/2009    TDAP status: Up to date  Flu Vaccine status: Due, Education has been provided regarding the importance of this vaccine. Advised may receive this vaccine at local pharmacy or Health Dept. Aware to provide a copy of the vaccination record if obtained from local pharmacy or Health Dept. Verbalized acceptance and understanding.  Pneumococcal vaccine status: Up to date  Covid-19 vaccine status: Completed vaccines  Qualifies for Shingles Vaccine? Yes   Zostavax completed No   Shingrix Completed?: No.    Education has been provided regarding the importance of this vaccine. Patient has been advised to call insurance company to determine out of pocket expense if  they have not yet received this vaccine. Advised may also receive vaccine at local pharmacy or Health Dept. Verbalized acceptance and understanding.  Screening Tests Health Maintenance  Topic Date Due   FOOT EXAM  08/31/2023 (Originally 05/30/2016)   COVID-19 Vaccine (3 - Moderna risk series) 09/20/2023 (Originally 01/12/2020)   Zoster Vaccines- Shingrix (1 of 2) 10/29/2023 (Originally 05/20/1965)   INFLUENZA VACCINE  11/18/2023 (Originally 03/21/2023)   DTaP/Tdap/Td (3 - Tdap) 07/30/2024 (Originally 04/13/2019)   HEMOGLOBIN A1C  09/06/2023   Diabetic kidney evaluation - Urine ACR  02/12/2024   Diabetic kidney evaluation - eGFR measurement  03/05/2024   OPHTHALMOLOGY EXAM  05/20/2024   Medicare Annual Wellness (AWV)  07/30/2024   Pneumonia Vaccine 57+ Years old  Completed   Hepatitis C Screening  Completed   HPV VACCINES  Aged Out   Fecal DNA (Cologuard)  Discontinued    Health Maintenance  There are no preventive care reminders to display for this patient.   Colorectal cancer screening: No longer required.   Lung Cancer Screening: (Low Dose CT Chest recommended if Age 62-80 years, 20 pack-year currently smoking OR have quit w/in 15years.) does not qualify.   Lung Cancer Screening Referral: no  Additional Screening:  Hepatitis C Screening: does not qualify; Completed 06/04/2016  Vision Screening: Recommended annual ophthalmology exams for early detection of glaucoma and other disorders of the eye. Is the patient up to date with their annual eye exam?  Yes  Who is the provider or what is the name of the office in which the patient attends annual eye exams? Dr Rolley Sims If pt is not established with a provider, would they like to be referred to a provider to establish care? No .   Dental Screening: Recommended annual dental exams for proper oral hygiene  Diabetic Foot Exam: Diabetic Foot Exam: Overdue, Pt has been advised about the importance in completing this exam. Pt is scheduled  for diabetic foot exam on 08/25/22.  Community Resource Referral / Chronic Care Management: CRR required this visit?  No   CCM required this visit?  No    Plan:     I have personally reviewed and noted the following in the patient's chart:   Medical and social history Use of alcohol, tobacco or illicit drugs  Current medications and supplements including opioid prescriptions. Patient is not currently taking opioid prescriptions. Functional ability and status Nutritional status Physical activity Advanced directives List of other physicians Hospitalizations, surgeries, and ER visits in previous  12 months Vitals Screenings to include cognitive, depression, and falls Referrals and appointments  In addition, I have reviewed and discussed with patient certain preventive protocols, quality metrics, and best practice recommendations. A written personalized care plan for preventive services as well as general preventive health recommendations were provided to patient.   Sue Lush, LPN   [id number]   After Visit Summary: (MyChart) Due to this being a telephonic visit, the after visit summary with patients personalized plan was offered to patient via MyChart   Nurse Notes: The patient states he is doing well and has no concerns or questions at this time. Pt does want to discuss the Shingles vaccine at his visit on 08/26/23.

## 2023-07-31 NOTE — Patient Instructions (Signed)
[name] , Thank you for taking time to come for your Medicare Wellness Visit. I appreciate your ongoing commitment to your health goals. Please review the following plan we discussed and let me know if I can assist you in the future.   Referrals/Orders/Follow-Ups/Clinician Recommendations: none  This is a list of the screening recommended for you and due dates:  Health Maintenance  Topic Date Due   Zoster (Shingles) Vaccine (1 of 2) Never done   Complete foot exam   05/30/2016   DTaP/Tdap/Td vaccine (3 - Tdap) 04/13/2019   COVID-19 Vaccine (3 - Moderna risk series) 01/12/2020   Flu Shot  03/21/2023   Hemoglobin A1C  09/06/2023   Yearly kidney health urinalysis for diabetes  02/12/2024   Yearly kidney function blood test for diabetes  03/05/2024   Eye exam for diabetics  05/20/2024   Medicare Annual Wellness Visit  07/30/2024   Pneumonia Vaccine  Completed   Hepatitis C Screening  Completed   HPV Vaccine  Aged Out   Cologuard (Stool DNA test)  Discontinued    Advanced directives: (Copy Requested) Please bring a copy of your health care power of attorney and living will to the office to be added to your chart at your convenience.  Next Medicare Annual Wellness Visit scheduled for next year: Yes 07/31/24@8 :50am telephone

## 2023-08-07 NOTE — Progress Notes (Unsigned)
Patient ID: [name], male   DOB: [birth year], 77 y.o.   MRN: [id number]  Chief Complaint: Follow-up after completed drainage for perforated appendicitis.  History of present illness: This patient is continuing to do extraordinarily well.  ***  Denies fevers, chills, denies vomiting, nausea or abdominal pain. His last colon screening has been by Cologuard.  He is probably due for a follow-up colonoscopy, at current not wishing to pursue any additional workup.  We discussed the role of CT scan to evaluate/confirm resolution of prior issues.  Once again he is doing so well he desires to defer this for now as well.  And we discussed the potential role for repeat colonoscopy. At present he is doing so well he would just assume continue close observation.  We discussed his cholelithiasis and the potential for it becoming increasingly symptomatic with time.  I believe he understands we reviewed his potential presentation for symptoms in the future, and I believe he will keep ready contact with [place] should anything change.  At present the foods that provoke biliary colic are things he avoids anyway.  Past Medical History Past Medical History:  Diagnosis Date   Acute perforated appendicitis 03/02/2023   Alcohol abuse, unspecified    CAD (coronary artery disease) 04/20/2005   CABG x4   Colon polyps    Depression    DMII (diabetes mellitus, type 2) (HCC) 05/20/2002   HLD (hyperlipidemia) 05/20/2002   HTN (hypertension) 05/20/2002   Motion sickness    planes, deep sea fishing      Past Surgical History:  Procedure Laterality Date   CATARACT EXTRACTION W/PHACO Right 04/12/2022   Procedure: CATARACT EXTRACTION PHACO AND INTRAOCULAR LENS PLACEMENT (IOC) RIGHT MALYUGIN OMIDRIA IRIS HOOKS;  Surgeon: Estanislado Pandy, MD;  Location: [facility];  Service: Ophthalmology;  Laterality: Right;  16.16 1:33.8   CATARACT EXTRACTION W/PHACO Left 04/26/2022   Procedure: CATARACT EXTRACTION PHACO  AND INTRAOCULAR LENS PLACEMENT (IOC) LEFT MALYUGIN OMIDIRA IRIS HOOKS;  Surgeon: Estanislado Pandy, MD;  Location: [facility];  Service: Ophthalmology;  Laterality: Left;  14.02 2:13.8   COLONOSCOPY     CORONARY ARTERY BYPASS GRAFT  9/06   x4   ESOPHAGOGASTRODUODENOSCOPY  7/07   polyp; mucosal abnml   IR RADIOLOGIST EVAL & MGMT  03/26/2023   TONSILLECTOMY      Allergies  Allergen Reactions   Sulfonamide Derivatives     REACTION: unknown reaction    Current Outpatient Medications  Medication Sig Dispense Refill   ACCU-CHEK SOFTCLIX LANCETS lancets Check blood sugar once daily and as instructed. Dx E11.65 100 each 3   amLODipine (NORVASC) 10 MG tablet Take 1 tablet (10 mg total) by mouth daily. 90 tablet 3   aspirin 81 MG tablet Take 1 tablet (81 mg total) by mouth daily.     atorvastatin (LIPITOR) 80 MG tablet TAKE 1 TABLET(80 MG) BY MOUTH DAILY 90 tablet 3   Blood Glucose Monitoring Suppl (ACCU-CHEK AVIVA PLUS) w/Device KIT Check blood sugar once daily and as instructed. Dx E11.65 1 kit 0   calcium carbonate (TUMS - DOSED IN MG ELEMENTAL CALCIUM) 500 MG chewable tablet Chew 1 tablet by mouth daily as needed for indigestion or heartburn.     doxazosin (CARDURA) 1 MG tablet Take 1 tablet (1 mg total) by mouth daily. 90 tablet 3   fish oil-omega-3 fatty acids 1000 MG capsule Take 2 g by mouth daily.     glucose blood (ACCU-CHEK AVIVA PLUS) test strip Check  blood sugar once daily and as instructed. Dx E11.65 100 each 3   losartan (COZAAR) 100 MG tablet TAKE 1 TABLET(100 MG) BY MOUTH DAILY 90 tablet 1   metFORMIN (GLUCOPHAGE-XR) 500 MG 24 hr tablet TAKE 4 TABLETS BY MOUTH EVERY DAY WITH BREAKFAST 360 tablet 3   metoprolol succinate (TOPROL-XL) 50 MG 24 hr tablet Take 1 tablet (50 mg total) by mouth daily. Take with or immediately following a meal. 90 tablet 3   Multiple Vitamin (MULTIVITAMIN) tablet Take 1 tablet by mouth daily.     ondansetron (ZOFRAN-ODT) 4 MG disintegrating  tablet Take 1 tablet (4 mg total) by mouth every 6 (six) hours as needed for nausea. (Patient not taking: Reported on 07/31/2023) 20 tablet 0   sodium chloride flush 0.9 % SOLN injection 5 mLs by Intracatheter route daily. (Patient not taking: Reported on 07/31/2023) 100 mL 2   No current facility-administered medications for this visit.    Family History Family History  Problem Relation Age of Onset   Alcohol abuse Father    Stroke Father    Other Father        cardiac problems   Heart disease Father    COPD Mother    Alcohol abuse Brother    Cirrhosis Brother        EtOH   Anxiety disorder Brother    Heart disease Paternal Grandmother    Colon cancer Neg Hx    Esophageal cancer Neg Hx    Rectal cancer Neg Hx    Stomach cancer Neg Hx       Social History Social History   Tobacco Use   Smoking status: Former   Smokeless tobacco: Never   Tobacco comments:    quit 41 years ago  Vaping Use   Vaping status: Never Used  Substance Use Topics   Alcohol use: Yes    Alcohol/week: 17.0 standard drinks of alcohol    Types: 2 Glasses of wine, 15 Shots of liquor per week    Comment: 2 glasses of wine or 3-4 shots of bourbon daily   Drug use: No       Physical Exam There were no vitals taken for this visit.    CONSTITUTIONAL: Well developed, and nourished, appropriately responsive and aware without distress.   EYES: Sclera non-icteric.   EARS, NOSE, MOUTH AND THROAT:  The oropharynx is clear. Oral mucosa is pink and moist.     Hearing is intact to voice.  NECK: Trachea is midline, and there is no jugular venous distension.  LYMPH NODES:  Lymph nodes in the neck are not appreciated. RESPIRATORY:   Normal respiratory effort without pathologic use of accessory muscles. CARDIOVASCULAR:  Well perfused.   GI: The abdomen is soft, nontender, and nondistended. There were no palpable masses.  No right upper quadrant tenderness, no right lower quadrant  tenderness.  MUSCULOSKELETAL:  Symmetrical muscle tone appreciated in all four extremities.    SKIN: Skin turgor is normal. No pathologic skin lesions appreciated.  NEUROLOGIC:  Motor and sensation appear grossly normal.  Cranial nerves are grossly without defect. PSYCH:  Alert and oriented to person, place and time. Affect is appropriate for situation.  Data Reviewed I have personally reviewed what is currently available of the patient's imaging, recent labs and medical records.   Labs:     Latest Ref Rng & Units 03/27/2023    2:03 PM 03/19/2023   10:52 AM 03/06/2023    5:52 AM  CBC  WBC 4.0 -  10.5 K/uL 7.6  7.5  8.7   Hemoglobin 13.0 - 17.0 g/dL 82.9  56.2  13.0   Hematocrit 39.0 - 52.0 % 36.3  39.0  35.3   Platelets 150 - 400 K/uL 233  350  203       Latest Ref Rng & Units 03/06/2023    5:52 AM 03/05/2023    6:30 AM 03/04/2023    5:30 AM  CMP  Glucose 70 - 99 mg/dL 865  784  696   BUN 8 - 23 mg/dL 9  9  12    Creatinine 0.61 - 1.24 mg/dL 2.95  2.84  1.32   Sodium 135 - 145 mmol/L 135  135  134   Potassium 3.5 - 5.1 mmol/L 3.8  3.6  3.8   Chloride 98 - 111 mmol/L 105  103  105   CO2 22 - 32 mmol/L 20  20  22    Calcium 8.9 - 10.3 mg/dL 7.9  7.8  8.3       Imaging: Radiological images reviewed:  CLINICAL DATA:  Follow-up perforated appendicitis and abscess. Previous percutaneous catheter drainage.   EXAM: CT ABDOMEN AND PELVIS WITH CONTRAST   TECHNIQUE: Multidetector CT imaging of the abdomen and pelvis was performed using the standard protocol following bolus administration of intravenous contrast.   RADIATION DOSE REDUCTION: This exam was performed according to the departmental dose-optimization program which includes automated exposure control, adjustment of the mA and/or kV according to patient size and/or use of iterative reconstruction technique.   CONTRAST:  ISOVUE-300 IOPAMIDOL (ISOVUE-300) INJECTION 61%   COMPARISON:  03/04/2023   FINDINGS: Lower  Chest: No acute findings.   Hepatobiliary: No suspicious hepatic masses identified. Gallstones are seen, however there is no evidence of cholecystitis or biliary dilatation.   Pancreas:  No mass or inflammatory changes.   Spleen: Within normal limits in size and appearance.   Adrenals/Urinary Tract: No suspicious masses identified. No evidence of ureteral calculi or hydronephrosis. Increased diffuse bladder wall thickening is seen, which may be due to cystitis or chronic bladder outlet obstruction.   Stomach/Bowel: Rim enhancing fluid collections in the right lower quadrant currently measures 4.4 x 2.4 cm, compared to 4.8 x 2.6 cm previously. Surrounding inflammatory changes show no significant change. Wall thickening of adjacent distal ileum and right colon has significantly decreased since previous study.   Vascular/Lymphatic: No pathologically enlarged lymph nodes. No acute vascular findings.   Reproductive:  Stable mildly enlarged prostate.   Other:  None.   Musculoskeletal:  No suspicious bone lesions identified.   IMPRESSION: Slight decrease in size of right lower quadrant abscess.   Decreased wall thickening of adjacent distal ileum and right colon.   Increased diffuse bladder wall thickening, which may be due to cystitis or chronic bladder outlet obstruction. Suggest correlation with urinalysis.   Stable mildly enlarged prostate.   Cholelithiasis. No radiographic evidence of cholecystitis.     Electronically Signed   By: Danae Orleans M.D.   On: 03/26/2023 16:49 Within last 24 hrs: No results found.  Assessment    Clinically he continues to do beyond expectations with no evidence of persistent infection. Status post perforated appendicitis, successful percutaneous drainage with treatment by antibiotics alone.  Patient Active Problem List   Diagnosis Date Noted   History of appendicitis 05/09/2023   Perforated appendicitis 03/02/2023   CAD (coronary  artery disease), autologous vein bypass graft 10/11/2011   Essential hypertension    Alcohol abuse 08/10/2010   Diabetes mellitus  treated with oral medication (HCC) 04/13/2009   Hyperlipidemia LDL goal <70 05/13/2008   History of colonic polyps 03/12/2007   DEPRESSION 12/03/2006    Plan    Options reviewed again.   Other etiologies then routine acute appendicitis may be unappreciated without further investigation.  We discussed this in detail, with interventions including repeated Cologuard, repeat colonoscopy, repeat CT scanning, and even ultrasound imaging of his cholelithiasis.  At present he is doing so well and he is so appreciative of his current health status that he is reluctant to get overly aggressive with further investigation.  Therefore I believe continued observation is minimal, and I believe he is reliable to follow-up.  Will anticipate seeing him in 3 months or as needed.  At that time it may be well worthwhile to repeat imaging, or consider other forms of intervention as indicated.  At present he clearly has no evidence of a recurrent infectious process, and at this point in time he is doing so well he wishes not to undergo another CT scan.  He is a reliable patient and I suspect he will let me know as soon as he has increased abdominal pain, or recurrent fevers or chills.  Will be quick to evaluate further as needed.   Face-to-face time spent with the patient and accompanying care providers(if present) was 30 minutes, with more than 50% of the time spent counseling, educating, and coordinating care of the patient.    These notes generated with voice recognition software. I apologize for typographical errors.  Campbell Lerner M.D., FACS 08/07/2023, 12:37 PM

## 2023-08-08 ENCOUNTER — Ambulatory Visit: Payer: Medicare HMO | Admitting: Surgery

## 2023-08-08 ENCOUNTER — Encounter: Payer: Self-pay | Admitting: Surgery

## 2023-08-08 VITALS — BP 149/72 | HR 60 | Temp 98.5°F | Ht 66.0 in | Wt 166.0 lb

## 2023-08-08 DIAGNOSIS — K3532 Acute appendicitis with perforation and localized peritonitis, without abscess: Secondary | ICD-10-CM

## 2023-08-08 DIAGNOSIS — K8020 Calculus of gallbladder without cholecystitis without obstruction: Secondary | ICD-10-CM

## 2023-08-08 DIAGNOSIS — K3533 Acute appendicitis with perforation and localized peritonitis, with abscess: Secondary | ICD-10-CM | POA: Diagnosis not present

## 2023-08-08 NOTE — Patient Instructions (Signed)
 Appendicitis, Adult  The appendix is a tube in the body that is shaped like a finger. It is attached to the large intestine. Appendicitis means that this tube is swollen (inflamed). If this is not treated, the tube can tear. This can lead to a life-threatening infection. This condition can also cause pus to build up in the appendix. What are the causes? Something blocking the appendix, such as: A ball of poop (stool). Lymph glands that are bigger than normal. Injury to the belly (abdomen). Sometimes the cause is not known. What increases the risk? You are more likely to get this condition if you are 71-20 years old. What are the signs or symptoms? Pain or tenderness that starts around the belly button and: Moves toward the lower right belly. Gets worse with time. Gets worse if you cough. Gets worse if you make a sudden move. Vomiting or feeling like you may vomit (nauseous). Not wanting to eat as much as normal (loss of appetite). A fever. Trouble pooping (constipation). Watery poop (diarrhea). Feel generally sick (malaise). How is this treated? In general, this condition is treated with both antibiotic medicines and surgery to take out the appendix. If only antibiotics are given and the appendix is not taken out, there is a chance the condition could come back. There are two ways to take out the appendix: Open surgery. For this method, the appendix is taken out through a large cut. This cut is called an incision and is made in the lower right belly. This surgery may be used if: You have scars from another surgery. You have a bleeding condition. You are pregnant and will be having your baby soon. You have a condition that makes it hard to do the other type of surgery. Laparoscopic surgery. For this method, the appendix is taken out through small cuts. Often, this surgery: Causes less pain. Causes fewer problems. Heals faster. If your appendix tears and pus forms: A drain may be  put into the sore. The drain will be used to get rid of the pus. You may get an antibiotic through an IV tube. Your appendix may or may not need to be taken out. Follow these instructions at home: If you had surgery: Follow instructions from your doctor on how to: Care for yourself at home. Take care of your cut from surgery. Medicines Take over-the-counter and prescription medicines only as told by your doctor. If you were prescribed an antibiotic medicine, take it as told by your doctor. Do not stop taking it even if you start to feel better. If told, take steps to prevent problems with pooping (constipation). You may need to: Drink enough fluid to keep your pee (urine) pale yellow. Take medicines. You will be told what medicines to take. Eat foods that are high in fiber. These include beans, whole grains, and fresh fruits and vegetables. Limit foods that are high in fat and sugar. These include fried or sweet foods. Ask your doctor if you should avoid driving or using machines while you are taking your medicine. General instructions Follow instructions from your doctor about what you cannot eat or drink. Do not smoke or use any products that contain nicotine or tobacco. If you need help quitting, ask your doctor. Return to your normal activities when your doctor says that it is safe. Keep all follow-up visits. Contact a doctor if: There is pus, blood, or a lot of fluid coming from your cut or cuts from surgery. You feel like you may  vomit, or you vomit. You have a fever. You are very tired. You have muscle pain. Get help right away if: You have pain in your belly, and the pain is getting worse. You are short of breath. You cannot stop vomiting. These symptoms may be an emergency. Get help right away. Call your local emergency services (911 in the [place] Do not wait to see if the symptoms will go away. Do not drive yourself to the hospital. Summary Appendicitis is swelling of  the appendix. The appendix is a tube that is shaped like a finger. It is joined to the large intestine. This condition may be caused by something that blocks the appendix. This can lead to an infection. This condition is most often treated with both antibiotic medicines and taking out the appendix. This information is not intended to replace advice given to you by your health care provider. Make sure you discuss any questions you have with your health care provider. Document Revised: 01/26/2021 Document Reviewed: 01/26/2021 Elsevier Patient Education  2024 [employer].

## 2023-08-10 ENCOUNTER — Other Ambulatory Visit: Payer: Self-pay | Admitting: Family Medicine

## 2023-08-25 NOTE — Progress Notes (Signed)
 Jeweliana Dudgeon T. Wade Asebedo, MD, CAQ Sports Medicine [facility] [address] [place], [address]  Phone: [phone]  FAX: [phone]  [name] - 78 y.o. male  MRN [id number]  Date of Birth: 1946-03-25  Date: 08/26/2023  PCP: Watt Mirza, MD  Referral: Watt Mirza, MD  Chief Complaint  Patient presents with   Diabetes    6 month follow   Subjective:   [name] is a 78 y.o. very pleasant male patient with Body mass index is 27.14 kg/m. who presents with the following:  Doing ok at home.    Some aches and pains at the basal joint and R knee.  Diabetes Mellitus: Tolerating Medications: yes Compliance with diet: fair, Body mass index is 27.14 kg/m. Exercise: minimal / intermittent Avg blood sugars at home: not checking Foot problems: none Hypoglycemia: none No nausea, vomitting, blurred vision, polyuria.  Lab Results  Component Value Date   HGBA1C 7.3 (A) 08/26/2023   HGBA1C 7.4 (H) 03/06/2023   HGBA1C 7.2 (H) 02/12/2023   Lab Results  Component Value Date   MICROALBUR <0.7 02/12/2023   LDLCALC 48 08/04/2021   CREATININE 0.98 03/06/2023    Wt Readings from Last 3 Encounters:  08/26/23 168 lb 2 oz (76.3 kg)  08/08/23 166 lb (75.3 kg)  07/31/23 165 lb (74.8 kg)    HTN: Tolerating all medications without side effects Stable and at goal No CP, no sob. No HA.  BP Readings from Last 3 Encounters:  08/26/23 (!) 150/74  08/08/23 (!) 149/72  05/13/23 130/62   - 120's normally at home  Basic Metabolic Panel:    Component Value Date/Time   NA 135 03/06/2023 0552   K 3.8 03/06/2023 0552   CL 105 03/06/2023 0552   CO2 20 (L) 03/06/2023 0552   BUN 9 03/06/2023 0552   CREATININE 0.98 03/06/2023 0552   GLUCOSE 186 (H) 03/06/2023 0552   CALCIUM  7.9 (L) 03/06/2023 0552     Review of Systems is noted in the HPI, as appropriate  Objective:   BP (!) 150/74 (BP Location: Left Arm, Patient Position:  Sitting, Cuff Size: Normal)   Pulse 72   Temp 98.2 F (36.8 C) (Temporal)   Ht 5' 6 (1.676 m)   Wt 168 lb 2 oz (76.3 kg)   SpO2 95%   BMI 27.14 kg/m   GEN: No acute distress; alert,appropriate. PULM: Breathing comfortably in no respiratory distress PSYCH: Normally interactive.  CV: RRR, no m/g/r   Laboratory and Imaging Data: Results for orders placed or performed in visit on 08/26/23  POCT glycosylated hemoglobin (Hb A1C)   Collection Time: 08/26/23 10:25 AM  Result Value Ref Range   Hemoglobin A1C 7.3 (A) 4.0 - 5.6 %   HbA1c POC (<> result, manual entry)     HbA1c, POC (prediabetic range)     HbA1c, POC (controlled diabetic range)       Assessment and Plan:     ICD-10-CM   1. Diabetes mellitus treated with oral medication (HCC)  E11.9 POCT glycosylated hemoglobin (Hb A1C)   Z79.84     2. Essential hypertension  I10     3. Hyperlipidemia LDL goal <70  E78.5      [name] is basically doing well.  A1c is a little bit elevated at 7.3, but he has been stable somewhere around 7.27.3 for extended period of time.  This point, can keep his medications stable and he is gena keep  on eating healthy and trying to control his alcohol intake with activity.  Blood pressure is mildly elevated today, he does check this multiple times per week at home and is generally in the 120s to 130s systolic.  Right now, I am going to have him check his blood pressure about 2 times a week to ensure that it is in the stable in normal range.  Stable lipids in a setting of coronary disease.  Tolerating all meds fine.  Medication Management during today's office visit: No orders of the defined types were placed in this encounter.  There are no discontinued medications.  Orders placed today for conditions managed today: Orders Placed This Encounter  Procedures   POCT glycosylated hemoglobin (Hb A1C)    Disposition: Return in about 6 months (around 02/23/2024) for Dr. Watt, Medicare  Wellness.  [employer] One speech-to-text software was used for transcription in this dictation.  Possible transcriptional errors can occur using Animal nutritionist.   Signed,  Jacques DASEN. Trinidad Petron, MD   Outpatient Encounter Medications as of 08/26/2023  Medication Sig   ACCU-CHEK SOFTCLIX LANCETS lancets Check blood sugar once daily and as instructed. Dx E11.65   amLODipine  (NORVASC ) 10 MG tablet Take 1 tablet (10 mg total) by mouth daily.   aspirin 81 MG tablet Take 1 tablet (81 mg total) by mouth daily.   atorvastatin  (LIPITOR) 80 MG tablet TAKE 1 TABLET(80 MG) BY MOUTH DAILY   Blood Glucose Monitoring Suppl (ACCU-CHEK AVIVA PLUS) w/Device KIT Check blood sugar once daily and as instructed. Dx E11.65   calcium  carbonate (TUMS - DOSED IN MG ELEMENTAL CALCIUM ) 500 MG chewable tablet Chew 1 tablet by mouth daily as needed for indigestion or heartburn.   doxazosin  (CARDURA ) 1 MG tablet Take 1 tablet (1 mg total) by mouth daily.   fish oil-omega-3 fatty acids 1000 MG capsule Take 2 g by mouth daily.   glucose blood (TRUE METRIX BLOOD GLUCOSE TEST) test strip USE TO CHECK BLOOD SUGAR ONCE DAILY AND AS INSTRUCTED   losartan  (COZAAR ) 100 MG tablet TAKE 1 TABLET(100 MG) BY MOUTH DAILY   metFORMIN  (GLUCOPHAGE -XR) 500 MG 24 hr tablet TAKE 4 TABLETS BY MOUTH EVERY DAY WITH BREAKFAST   metoprolol  succinate (TOPROL -XL) 50 MG 24 hr tablet Take 1 tablet (50 mg total) by mouth daily. Take with or immediately following a meal.   Multiple Vitamin (MULTIVITAMIN) tablet Take 1 tablet by mouth daily.   No facility-administered encounter medications on file as of 08/26/2023.

## 2023-08-26 ENCOUNTER — Ambulatory Visit (INDEPENDENT_AMBULATORY_CARE_PROVIDER_SITE_OTHER): Payer: Medicare HMO | Admitting: Family Medicine

## 2023-08-26 ENCOUNTER — Encounter: Payer: Self-pay | Admitting: Family Medicine

## 2023-08-26 VITALS — BP 150/68 | HR 72 | Temp 98.2°F | Ht 66.0 in | Wt 168.1 lb

## 2023-08-26 DIAGNOSIS — E785 Hyperlipidemia, unspecified: Secondary | ICD-10-CM | POA: Diagnosis not present

## 2023-08-26 DIAGNOSIS — E119 Type 2 diabetes mellitus without complications: Secondary | ICD-10-CM

## 2023-08-26 DIAGNOSIS — I10 Essential (primary) hypertension: Secondary | ICD-10-CM

## 2023-08-26 DIAGNOSIS — Z7984 Long term (current) use of oral hypoglycemic drugs: Secondary | ICD-10-CM

## 2023-08-26 LAB — POCT GLYCOSYLATED HEMOGLOBIN (HGB A1C): Hemoglobin A1C: 7.3 % — AB (ref 4.0–5.6)

## 2023-09-05 ENCOUNTER — Other Ambulatory Visit: Payer: Self-pay | Admitting: Family Medicine

## 2023-10-04 ENCOUNTER — Other Ambulatory Visit: Payer: Self-pay | Admitting: Family Medicine

## 2024-01-27 ENCOUNTER — Telehealth: Payer: Self-pay | Admitting: *Deleted

## 2024-01-27 DIAGNOSIS — E1165 Type 2 diabetes mellitus with hyperglycemia: Secondary | ICD-10-CM

## 2024-01-27 DIAGNOSIS — Z79899 Other long term (current) drug therapy: Secondary | ICD-10-CM

## 2024-01-27 DIAGNOSIS — E785 Hyperlipidemia, unspecified: Secondary | ICD-10-CM

## 2024-01-27 NOTE — Telephone Encounter (Signed)
-----   Message from Darcella Earnest sent at 01/27/2024  9:17 AM EDT ----- Regarding: Labs for Monday 6.30.25 Please put physical lab orders in future. Thank you, [place] Dales

## 2024-02-17 ENCOUNTER — Other Ambulatory Visit (INDEPENDENT_AMBULATORY_CARE_PROVIDER_SITE_OTHER): Payer: Medicare HMO

## 2024-02-17 DIAGNOSIS — E785 Hyperlipidemia, unspecified: Secondary | ICD-10-CM | POA: Diagnosis not present

## 2024-02-17 DIAGNOSIS — Z79899 Other long term (current) drug therapy: Secondary | ICD-10-CM | POA: Diagnosis not present

## 2024-02-17 DIAGNOSIS — E1165 Type 2 diabetes mellitus with hyperglycemia: Secondary | ICD-10-CM | POA: Diagnosis not present

## 2024-02-17 LAB — HEPATIC FUNCTION PANEL
ALT: 27 U/L (ref 0–53)
AST: 18 U/L (ref 0–37)
Albumin: 4.7 g/dL (ref 3.5–5.2)
Alkaline Phosphatase: 40 U/L (ref 39–117)
Bilirubin, Direct: 0.1 mg/dL (ref 0.0–0.3)
Total Bilirubin: 0.7 mg/dL (ref 0.2–1.2)
Total Protein: 7.2 g/dL (ref 6.0–8.3)

## 2024-02-17 LAB — MICROALBUMIN / CREATININE URINE RATIO
Creatinine,U: 49.2 mg/dL
Microalb Creat Ratio: UNDETERMINED mg/g (ref 0.0–30.0)
Microalb, Ur: <0.7 mg/dL

## 2024-02-17 LAB — CBC WITH DIFFERENTIAL/PLATELET
Basophils Absolute: 0 10*3/uL (ref 0.0–0.1)
Basophils Relative: 1.1 % (ref 0.0–3.0)
Eosinophils Absolute: 0.3 10*3/uL (ref 0.0–0.7)
Eosinophils Relative: 6.3 % — ABNORMAL HIGH (ref 0.0–5.0)
HCT: 42.3 % (ref 39.0–52.0)
Hemoglobin: 14.3 g/dL (ref 13.0–17.0)
Lymphocytes Relative: 33.8 % (ref 12.0–46.0)
Lymphs Abs: 1.5 10*3/uL (ref 0.7–4.0)
MCHC: 33.9 g/dL (ref 30.0–36.0)
MCV: 93.1 fl (ref 78.0–100.0)
Monocytes Absolute: 0.4 10*3/uL (ref 0.1–1.0)
Monocytes Relative: 7.8 % (ref 3.0–12.0)
Neutro Abs: 2.3 10*3/uL (ref 1.4–7.7)
Neutrophils Relative %: 51 % (ref 43.0–77.0)
Platelets: 178 10*3/uL (ref 150.0–400.0)
RBC: 4.54 Mil/uL (ref 4.22–5.81)
RDW: 13.2 % (ref 11.5–15.5)
WBC: 4.5 10*3/uL (ref 4.0–10.5)

## 2024-02-17 LAB — LIPID PANEL
Cholesterol: 124 mg/dL (ref 0–200)
HDL: 31.7 mg/dL — ABNORMAL LOW (ref >39.00)
LDL Cholesterol: 59 mg/dL (ref 0–99)
NonHDL: 91.96
Total CHOL/HDL Ratio: 4
Triglycerides: 164 mg/dL — ABNORMAL HIGH (ref 0.0–149.0)
VLDL: 32.8 mg/dL (ref 0.0–40.0)

## 2024-02-17 LAB — BASIC METABOLIC PANEL WITH GFR
BUN: 20 mg/dL (ref 6–23)
CO2: 26 meq/L (ref 19–32)
Calcium: 10.2 mg/dL (ref 8.4–10.5)
Chloride: 105 meq/L (ref 96–112)
Creatinine, Ser: 1.27 mg/dL (ref 0.40–1.50)
GFR: 54.4 mL/min — ABNORMAL LOW (ref >60.00)
Glucose, Bld: 163 mg/dL — ABNORMAL HIGH (ref 70–99)
Potassium: 4.6 meq/L (ref 3.5–5.1)
Sodium: 140 meq/L (ref 135–145)

## 2024-02-17 LAB — HEMOGLOBIN A1C: Hgb A1c MFr Bld: 7.7 % — ABNORMAL HIGH (ref 4.6–6.5)

## 2024-02-23 NOTE — Progress Notes (Unsigned)
 Gregory Mannes T. [name], MD, CAQ [facility] at [facility] [address] [place], [address]  Phone: [phone]  FAX: [phone]  [name] - 78 y.o. male  MRN [id number]  Date of Birth: [birth year]  Date: 02/24/2024  PCP: Watt Mirza, MD  Referral: Watt Mirza, MD  No chief complaint on file.  Patient Care Team: Watt Mirza, MD as PCP - General Gollan, Evalene PARAS, MD as PCP - Cardiology (Cardiology) Perla Evalene PARAS, MD as Consulting Physician (Cardiology) Enola Feliciano Hugger, MD as Consulting Physician (Ophthalmology) Subjective:   [name] is a 78 y.o. pleasant patient who presents with the following:  Preventative Health Maintenance Visit:  Health Maintenance Summary Reviewed and updated, unless pt declines services.  Tobacco History Reviewed. Alcohol: No concerns, no excessive use Exercise Habits: Some activity, rec at least 30 mins 5 times a week STD concerns: no risk or activity to increase risk Drug Use: None  Shingrix Foot exam COVID booster Tdap    Diabetes Mellitus: Tolerating Medications: yes Compliance with diet: fair, There is no height or weight on file to calculate BMI. Exercise: minimal / intermittent Avg blood sugars at home: not checking Foot problems: none Hypoglycemia: none No nausea, vomitting, blurred vision, polyuria.  Lab Results  Component Value Date   HGBA1C 7.7 (H) 02/17/2024   HGBA1C 7.3 (A) 08/26/2023   HGBA1C 7.4 (H) 03/06/2023   Lab Results  Component Value Date   MICROALBUR <0.7 02/17/2024   LDLCALC 59 02/17/2024   CREATININE 1.27 02/17/2024    Wt Readings from Last 3 Encounters:  08/26/23 168 lb 2 oz (76.3 kg)  08/08/23 166 lb (75.3 kg)  07/31/23 165 lb (74.8 kg)    HTN: Tolerating all medications without side effects Stable and at goal No CP, no sob. No HA.  BP Readings from Last 3 Encounters:  08/26/23 (!) 150/68  08/08/23 (!) 149/72   05/13/23 130/62    Basic Metabolic Panel:    Component Value Date/Time   NA 140 02/17/2024 0928   K 4.6 02/17/2024 0928   CL 105 02/17/2024 0928   CO2 26 02/17/2024 0928   BUN 20 02/17/2024 0928   CREATININE 1.27 02/17/2024 0928   GLUCOSE 163 (H) 02/17/2024 0928   CALCIUM  10.2 02/17/2024 0928     Health Maintenance  Topic Date Due   Zoster Vaccines- Shingrix (1 of 2) Never done   FOOT EXAM  05/30/2016   COVID-19 Vaccine (3 - Moderna risk series) 01/12/2020   DTaP/Tdap/Td (3 - Tdap) 07/30/2024 (Originally 04/13/2019)   INFLUENZA VACCINE  03/20/2024   OPHTHALMOLOGY EXAM  05/20/2024   Medicare Annual Wellness (AWV)  07/30/2024   HEMOGLOBIN A1C  08/18/2024   Diabetic kidney evaluation - eGFR measurement  02/16/2025   Diabetic kidney evaluation - Urine ACR  02/16/2025   Pneumococcal Vaccine: 50+ Years  Completed   Hepatitis C Screening  Completed   Hepatitis B Vaccines  Aged Out   HPV VACCINES  Aged Out   Meningococcal B Vaccine  Aged Out   Fecal DNA (Cologuard)  Discontinued   Immunization History  Administered Date(s) Administered   Fluad Quad(high Dose 65+) 05/27/2020, 08/07/2021, 08/08/2022   Influenza Split 08/02/2011, 06/11/2012   Influenza Whole 08/10/2010   Influenza, High Dose Seasonal PF 05/18/2014   Influenza,inj,Quad PF,6+ Mos 05/25/2013, 05/30/2015, 06/13/2017   Influenza-Unspecified 05/16/2016   Moderna Sars-Covid-2 Vaccination 11/17/2019, 12/15/2019   Pneumococcal Conjugate-13 05/30/2015   Pneumococcal Polysaccharide-23 10/11/2011  Td 08/20/1993, 04/12/2009   Patient Active Problem List   Diagnosis Date Noted   CAD (coronary artery disease), autologous vein bypass graft 10/11/2011    Priority: High   Diabetes mellitus treated with oral medication (HCC) 04/13/2009    Priority: High   Essential hypertension     Priority: Medium    Hyperlipidemia LDL goal <70 05/13/2008    Priority: Medium    Perforated appendicitis 03/02/2023   Alcohol abuse  08/10/2010   DEPRESSION 12/03/2006    Past Medical History:  Diagnosis Date   Acute perforated appendicitis 03/02/2023   Alcohol abuse, unspecified    CAD (coronary artery disease) 04/20/2005   CABG x4   Colon polyps    Depression    DMII (diabetes mellitus, type 2) (HCC) 05/20/2002   HLD (hyperlipidemia) 05/20/2002   HTN (hypertension) 05/20/2002   Motion sickness    planes, deep sea fishing    Past Surgical History:  Procedure Laterality Date   CATARACT EXTRACTION W/PHACO Right 04/12/2022   Procedure: CATARACT EXTRACTION PHACO AND INTRAOCULAR LENS PLACEMENT (IOC) RIGHT MALYUGIN OMIDRIA  IRIS HOOKS;  Surgeon: Enola Feliciano Hugger, MD;  Location: [facility];  Service: Ophthalmology;  Laterality: Right;  16.16 1:33.8   CATARACT EXTRACTION W/PHACO Left 04/26/2022   Procedure: CATARACT EXTRACTION PHACO AND INTRAOCULAR LENS PLACEMENT (IOC) LEFT MALYUGIN OMIDIRA IRIS HOOKS;  Surgeon: Enola Feliciano Hugger, MD;  Location: [facility];  Service: Ophthalmology;  Laterality: Left;  14.02 2:13.8   COLONOSCOPY     CORONARY ARTERY BYPASS GRAFT  9/06   x4   ESOPHAGOGASTRODUODENOSCOPY  7/07   polyp; mucosal abnml   IR RADIOLOGIST EVAL & MGMT  03/26/2023   TONSILLECTOMY      Family History  Problem Relation Age of Onset   Alcohol abuse Father    Stroke Father    Other Father        cardiac problems   Heart disease Father    COPD Mother    Alcohol abuse Brother    Cirrhosis Brother        EtOH   Anxiety disorder Brother    Heart disease Paternal Grandmother    Colon cancer Neg Hx    Esophageal cancer Neg Hx    Rectal cancer Neg Hx    Stomach cancer Neg Hx     Social History   Social History Narrative   Wife Landry is deceased   He is retired.    Enjoys firearms and recreational shooting.     Past Medical History, Surgical History, Social History, Family History, Problem List, Medications, and Allergies have been reviewed and updated if relevant.  Review of  Systems: Pertinent positives are listed above.  Otherwise, a full 14 point review of systems has been done in full and it is negative except where it is noted positive.  Objective:   There were no vitals taken for this visit. Ideal Body Weight:    Ideal Body Weight:   No results found.    07/31/2023    8:58 AM 12/18/2022    2:47 PM 08/08/2022   10:01 AM 08/02/2021    3:43 PM 07/27/2019    9:58 AM  Depression screen PHQ 2/9  Decreased Interest 0 0 0 0 1  Down, Depressed, Hopeless 0 0 0 0 1  PHQ - 2 Score 0 0 0 0 2  Altered sleeping  0   0  Tired, decreased energy  0   0  Change in appetite  0   0  Feeling bad or failure about yourself   0   0  Trouble concentrating  0   0  Moving slowly or fidgety/restless  0   0  Suicidal thoughts  0   0  PHQ-9 Score  0   2  Difficult doing work/chores  Not difficult at all   Not difficult at all     GEN: well developed, well nourished, no acute distress Eyes: conjunctiva and lids normal, PERRLA, EOMI ENT: TM clear, nares clear, oral exam WNL Neck: supple, no lymphadenopathy, no thyromegaly, no JVD Pulm: clear to auscultation and percussion, respiratory effort normal CV: regular rate and rhythm, S1-S2, no murmur, rub or gallop, no bruits, peripheral pulses normal and symmetric, no cyanosis, clubbing, edema or varicosities GI: soft, non-tender; no hepatosplenomegaly, masses; active bowel sounds all quadrants GU: deferred Lymph: no cervical, axillary or inguinal adenopathy MSK: gait normal, muscle tone and strength WNL, no joint swelling, effusions, discoloration, crepitus  SKIN: clear, good turgor, color WNL, no rashes, lesions, or ulcerations Neuro: normal mental status, normal strength, sensation, and motion Psych: alert; oriented to person, place and time, normally interactive and not anxious or depressed in appearance.  All labs reviewed with patient. Results for orders placed or performed in visit on 02/17/24  Microalbumin /  creatinine urine ratio   Collection Time: 02/17/24  9:28 AM  Result Value Ref Range   Microalb, Ur <0.7 mg/dL   Creatinine,U 50.7 mg/dL   Microalb Creat Ratio Unable to calculate 0.0 - 30.0 mg/g  Lipid panel   Collection Time: 02/17/24  9:28 AM  Result Value Ref Range   Cholesterol 124 0 - 200 mg/dL   Triglycerides 835.9 (H) 0.0 - 149.0 mg/dL   HDL 68.29 (L) >60.99 mg/dL   VLDL 67.1 0.0 - 59.9 mg/dL   LDL Cholesterol 59 0 - 99 mg/dL   Total CHOL/HDL Ratio 4    NonHDL 91.96   Hemoglobin A1c   Collection Time: 02/17/24  9:28 AM  Result Value Ref Range   Hgb A1c MFr Bld 7.7 (H) 4.6 - 6.5 %  Hepatic function panel   Collection Time: 02/17/24  9:28 AM  Result Value Ref Range   Total Bilirubin 0.7 0.2 - 1.2 mg/dL   Bilirubin, Direct 0.1 0.0 - 0.3 mg/dL   Alkaline Phosphatase 40 39 - 117 U/L   AST 18 0 - 37 U/L   ALT 27 0 - 53 U/L   Total Protein 7.2 6.0 - 8.3 g/dL   Albumin 4.7 3.5 - 5.2 g/dL  CBC with Differential/Platelet   Collection Time: 02/17/24  9:28 AM  Result Value Ref Range   WBC 4.5 4.0 - 10.5 K/uL   RBC 4.54 4.22 - 5.81 Mil/uL   Hemoglobin 14.3 13.0 - 17.0 g/dL   HCT 57.6 60.9 - 47.9 %   MCV 93.1 78.0 - 100.0 fl   MCHC 33.9 30.0 - 36.0 g/dL   RDW 86.7 88.4 - 84.4 %   Platelets 178.0 150.0 - 400.0 K/uL   Neutrophils Relative % 51.0 43.0 - 77.0 %   Lymphocytes Relative 33.8 12.0 - 46.0 %   Monocytes Relative 7.8 3.0 - 12.0 %   Eosinophils Relative 6.3 (H) 0.0 - 5.0 %   Basophils Relative 1.1 0.0 - 3.0 %   Neutro Abs 2.3 1.4 - 7.7 K/uL   Lymphs Abs 1.5 0.7 - 4.0 K/uL   Monocytes Absolute 0.4 0.1 - 1.0 K/uL   Eosinophils Absolute 0.3 0.0 - 0.7 K/uL  Basophils Absolute 0.0 0.0 - 0.1 K/uL  Basic metabolic panel   Collection Time: 02/17/24  9:28 AM  Result Value Ref Range   Sodium 140 135 - 145 mEq/L   Potassium 4.6 3.5 - 5.1 mEq/L   Chloride 105 96 - 112 mEq/L   CO2 26 19 - 32 mEq/L   Glucose, Bld 163 (H) 70 - 99 mg/dL   BUN 20 6 - 23 mg/dL   Creatinine,  Ser 8.72 0.40 - 1.50 mg/dL   GFR 45.59 (L) >39.99 mL/min   Calcium  10.2 8.4 - 10.5 mg/dL    Assessment and Plan:     ICD-10-CM   1. Healthcare maintenance  Z00.00       Health Maintenance Exam: The patient's preventative maintenance and recommended screening tests for an annual wellness exam were reviewed in full today. Brought up to date unless services declined.  Counselled on the importance of diet, exercise, and its role in overall health and mortality. The patient's FH and SH was reviewed, including their home life, tobacco status, and drug and alcohol status.  Follow-up in 1 year for physical exam or additional follow-up below.  Disposition: No follow-ups on file.  No orders of the defined types were placed in this encounter.  There are no discontinued medications. No orders of the defined types were placed in this encounter.   Signed,  Jacques DASEN. Rosezella Kronick, MD   Allergies as of 02/24/2024       Reactions   Sulfonamide Derivatives    REACTION: unknown reaction        Medication List        Accurate as of February 23, 2024  8:56 AM. If you have any questions, ask your nurse or doctor.          Accu-Chek Aviva Plus w/Device Kit Check blood sugar once daily and as instructed. Dx E11.65   Accu-Chek Softclix Lancets lancets Check blood sugar once daily and as instructed. Dx E11.65   amLODipine  10 MG tablet Commonly known as: NORVASC  Take 1 tablet (10 mg total) by mouth daily.   aspirin 81 MG tablet Take 1 tablet (81 mg total) by mouth daily.   atorvastatin  80 MG tablet Commonly known as: LIPITOR TAKE 1 TABLET(80 MG) BY MOUTH DAILY   calcium  carbonate 500 MG chewable tablet Commonly known as: TUMS - dosed in mg elemental calcium  Chew 1 tablet by mouth daily as needed for indigestion or heartburn.   doxazosin  1 MG tablet Commonly known as: CARDURA  Take 1 tablet (1 mg total) by mouth daily.   fish oil-omega-3 fatty acids 1000 MG capsule Take 2 g by  mouth daily.   losartan  100 MG tablet Commonly known as: COZAAR  TAKE 1 TABLET(100 MG) BY MOUTH DAILY   metFORMIN  500 MG 24 hr tablet Commonly known as: GLUCOPHAGE -XR TAKE 4 TABLETS BY MOUTH EVERY DAY WITH BREAKFAST   metoprolol  succinate 50 MG 24 hr tablet Commonly known as: TOPROL -XL Take 1 tablet (50 mg total) by mouth daily. Take with or immediately following a meal.   multivitamin tablet Take 1 tablet by mouth daily.   True Metrix Blood Glucose Test test strip Generic drug: glucose blood USE TO CHECK BLOOD SUGAR ONCE DAILY AND AS INSTRUCTED

## 2024-02-24 ENCOUNTER — Ambulatory Visit (INDEPENDENT_AMBULATORY_CARE_PROVIDER_SITE_OTHER): Payer: Medicare HMO | Admitting: Family Medicine

## 2024-02-24 ENCOUNTER — Encounter: Payer: Self-pay | Admitting: Family Medicine

## 2024-02-24 VITALS — BP 138/70 | HR 65 | Temp 98.5°F | Ht 66.0 in | Wt 166.5 lb

## 2024-02-24 DIAGNOSIS — Z0000 Encounter for general adult medical examination without abnormal findings: Secondary | ICD-10-CM | POA: Diagnosis not present

## 2024-02-24 DIAGNOSIS — R1310 Dysphagia, unspecified: Secondary | ICD-10-CM | POA: Diagnosis not present

## 2024-02-24 NOTE — Patient Instructions (Signed)
 Shingrix vaccination - consider getting this  Tetanus booster - get a booster shot

## 2024-02-25 ENCOUNTER — Encounter: Payer: Self-pay | Admitting: Family Medicine

## 2024-04-19 ENCOUNTER — Other Ambulatory Visit: Payer: Self-pay | Admitting: Family Medicine

## 2024-05-09 ENCOUNTER — Other Ambulatory Visit: Payer: Self-pay | Admitting: Cardiovascular Disease

## 2024-05-15 ENCOUNTER — Encounter: Payer: Self-pay | Admitting: Emergency Medicine

## 2024-05-15 ENCOUNTER — Ambulatory Visit
Admission: EM | Admit: 2024-05-15 | Discharge: 2024-05-15 | Disposition: A | Discharge status: Home / Self Care | Attending: Emergency Medicine | Admitting: Emergency Medicine

## 2024-05-15 DIAGNOSIS — R3912 Poor urinary stream: Secondary | ICD-10-CM

## 2024-05-15 DIAGNOSIS — M545 Low back pain, unspecified: Secondary | ICD-10-CM | POA: Diagnosis not present

## 2024-05-15 LAB — POCT URINE DIPSTICK
Bilirubin, UA: NEGATIVE
Blood, UA: NEGATIVE
Glucose, UA: NEGATIVE mg/dL
Ketones, POC UA: NEGATIVE mg/dL
Leukocytes, UA: NEGATIVE
Nitrite, UA: NEGATIVE
POC PROTEIN,UA: NEGATIVE
Spec Grav, UA: 1.02 (ref 1.010–1.025)
Urobilinogen, UA: 0.2 U/dL
pH, UA: 7 (ref 5.0–8.0)

## 2024-05-15 MED ORDER — NITROFURANTOIN MONOHYD MACRO 100 MG PO CAPS: 100.0000 mg | ORAL_CAPSULE | Freq: Two times a day (BID) | ORAL | 0 refills | Status: AC

## 2024-05-15 NOTE — ED Provider Notes (Signed)
 [name]    CSN: [id number] Arrival date & time: 05/15/24  9097      History   Chief Complaint Chief Complaint  Patient presents with   Back Pain    HPI [name] is a 78 y.o. male.   Patient presents for evaluation of bilateral low back pain present for 4 days right above the belt line.  Symptoms have been constant, worse at nighttime, exacerbated by changing in position and with sitting.  Intermittently experiencing sharp shooting pain.  Has noticed a weakened stream from baseline, endorses a history of issues with the prostate, taking doxazosin .  Denies dysuria, hematuria, abdominal pain, fever.  Endorses this feels similar to when he had urinary infection in the past.  Past Medical History:  Diagnosis Date   Acute perforated appendicitis 03/02/2023   Alcohol abuse, unspecified    CAD (coronary artery disease) 04/20/2005   CABG x4   Colon polyps    Depression    DMII (diabetes mellitus, type 2) (HCC) 05/20/2002   HLD (hyperlipidemia) 05/20/2002   HTN (hypertension) 05/20/2002    Patient Active Problem List   Diagnosis Date Noted   Perforated appendicitis 03/02/2023   CAD (coronary artery disease), autologous vein bypass graft 10/11/2011   Essential hypertension    Alcohol abuse 08/10/2010   Diabetes mellitus treated with oral medication (HCC) 04/13/2009   Hyperlipidemia LDL goal <70 05/13/2008   DEPRESSION 12/03/2006    Past Surgical History:  Procedure Laterality Date   CATARACT EXTRACTION W/PHACO Right 04/12/2022   Procedure: CATARACT EXTRACTION PHACO AND INTRAOCULAR LENS PLACEMENT (IOC) RIGHT MALYUGIN OMIDRIA  IRIS HOOKS;  Surgeon: Enola Feliciano Hugger, MD;  Location: [facility];  Service: Ophthalmology;  Laterality: Right;  16.16 1:33.8   CATARACT EXTRACTION W/PHACO Left 04/26/2022   Procedure: CATARACT EXTRACTION PHACO AND INTRAOCULAR LENS PLACEMENT (IOC) LEFT MALYUGIN OMIDIRA IRIS HOOKS;  Surgeon: Enola Feliciano Hugger, MD;   Location: [facility];  Service: Ophthalmology;  Laterality: Left;  14.02 2:13.8   CORONARY ARTERY BYPASS GRAFT  04/20/2005   x4   ESOPHAGOGASTRODUODENOSCOPY  02/17/2006   polyp; mucosal abnml   IR RADIOLOGIST EVAL & MGMT  03/26/2023   TONSILLECTOMY         Home Medications    Prior to Admission medications   Medication Sig Start Date End Date Taking? Authorizing Provider  nitrofurantoin , macrocrystal-monohydrate, (MACROBID ) 100 MG capsule Take 1 capsule (100 mg total) by mouth 2 (two) times daily for 7 days. 05/15/24 05/22/24 Yes Blakley Michna, Shelba SAUNDERS, NP  ACCU-CHEK SOFTCLIX LANCETS lancets Check blood sugar once daily and as instructed. Dx E11.65 06/18/17   Watt Mirza, MD  amLODipine  (NORVASC ) 10 MG tablet Take 1 tablet (10 mg total) by mouth daily. 05/13/23   Gollan, Timothy J, MD  aspirin 81 MG tablet Take 1 tablet (81 mg total) by mouth daily. 06/11/17   Gollan, Timothy J, MD  atorvastatin  (LIPITOR) 80 MG tablet TAKE 1 TABLET(80 MG) BY MOUTH DAILY 05/13/23   Gollan, Timothy J, MD  Blood Glucose Monitoring Suppl (ACCU-CHEK AVIVA PLUS) w/Device KIT Check blood sugar once daily and as instructed. Dx E11.65 06/18/17   Watt Mirza, MD  calcium  carbonate (TUMS - DOSED IN MG ELEMENTAL CALCIUM ) 500 MG chewable tablet Chew 1 tablet by mouth daily as needed for indigestion or heartburn.    [provider]  doxazosin  (CARDURA ) 1 MG tablet TAKE 1 TABLET(1 MG) BY MOUTH DAILY 05/11/24   Gollan, Timothy J, MD  fish oil-omega-3 fatty acids 1000  MG capsule Take 2 g by mouth daily.    [provider]  glucose blood (TRUE METRIX BLOOD GLUCOSE TEST) test strip USE TO CHECK BLOOD SUGAR ONCE DAILY AND AS INSTRUCTED 08/12/23   Copland, Jacques, MD  losartan  (COZAAR ) 100 MG tablet TAKE 1 TABLET(100 MG) BY MOUTH DAILY 04/20/24   Copland, Jacques, MD  metFORMIN  (GLUCOPHAGE -XR) 500 MG 24 hr tablet TAKE 4 TABLETS BY MOUTH EVERY DAY WITH BREAKFAST 07/31/23   Copland, Jacques, MD   metoprolol  succinate (TOPROL -XL) 50 MG 24 hr tablet Take 1 tablet (50 mg total) by mouth daily. Take with or immediately following a meal. 05/13/23   Gollan, Timothy J, MD  Multiple Vitamin (MULTIVITAMIN) tablet Take 1 tablet by mouth daily.    [provider]    Family History Family History  Problem Relation Age of Onset   Alcohol abuse Father    Stroke Father    Other Father        cardiac problems   Heart disease Father    COPD Mother    Alcohol abuse Brother    Cirrhosis Brother        EtOH   Anxiety disorder Brother    Heart disease Paternal Grandmother    Colon cancer Neg Hx    Esophageal cancer Neg Hx    Rectal cancer Neg Hx    Stomach cancer Neg Hx     Social History Social History   Tobacco Use   Smoking status: Former   Smokeless tobacco: Never   Tobacco comments:    quit 41 years ago  Vaping Use   Vaping status: Never Used  Substance Use Topics   Alcohol use: Yes    Alcohol/week: 17.0 standard drinks of alcohol    Types: 2 Glasses of wine, 15 Shots of liquor per week    Comment: 2 glasses of wine or 3-4 shots of bourbon daily   Drug use: No     Allergies   Sulfonamide derivatives   Review of Systems Review of Systems   Physical Exam Triage Vital Signs ED Triage Vitals [05/15/24 0913]  Encounter Vitals Group     BP      Girls Systolic BP Percentile      Girls Diastolic BP Percentile      Boys Systolic BP Percentile      Boys Diastolic BP Percentile      Pulse      Resp      Temp      Temp src      SpO2      Weight      Height      Head Circumference      Peak Flow      Pain Score 1     Pain Loc      Pain Education      Exclude from Growth Chart    No data found.  Updated Vital Signs There were no vitals taken for this visit.  Visual Acuity Right Eye Distance:   Left Eye Distance:   Bilateral Distance:    Right Eye Near:   Left Eye Near:    Bilateral Near:     Physical Exam Constitutional:      Appearance:  Normal appearance.  Eyes:     Extraocular Movements: Extraocular movements intact.  Pulmonary:     Effort: Pulmonary effort is normal.  Abdominal:     Tenderness: There is no right CVA tenderness or left CVA tenderness.  Musculoskeletal:  Comments: Unable to reproduce back pain, no ecchymosis swelling or deformity, pain is elicited with changing in position but negative straight leg test.  Neurological:     Mental Status: He is alert and oriented to person, place, and time.      UC Treatments / Results  Labs (all labs ordered are listed, but only abnormal results are displayed) Labs Reviewed - No data to display  EKG   Radiology No results found.  Procedures Procedures (including critical care time)  Medications Ordered in UC Medications - No data to display  Initial Impression / Assessment and Plan / UC Course  I have reviewed the triage vital signs and the nursing notes.  Pertinent labs & imaging results that were available during my care of the patient were reviewed by me and considered in my medical decision making (see chart for details).  Acute bilateral low back pain without sciatica, weak urine stream  Urinalysis negative, unable to provide sample in clinic but did bring sample with him from a urine cup given by his PCP, use for extreme the morning, will return to clinic with clean urine sample for culturing, prescribed Macrobid , chosen based on prior urine culture available per chart review showing Enterococcus faecalis, recommended supportive care and advised for persisting symptoms to follow-up with primary doctor Final Clinical Impressions(s) / UC Diagnoses   Final diagnoses:  Acute bilateral low back pain without sciatica  Weak urine stream     Discharge Instructions      Your urinalysis does not show infection , your urine will be sent to the lab to determine exactly which bacteria is present, if any changes need to be made to your medications you  will be notified  Please return to the clinic with new urine samples that it can be sent to the lab to see if she is able to isolate any bacteria  Begin use of Macrobid  twice daily for 7 days  Increase your fluid intake through use of water  You may take Tylenol  and or Motrin as needed for back pain, may apply warm compresses over the affected area and massage and stretch if tolerable  If symptoms continue to persist after use of medication or recur please follow-up with urgent care or your primary doctor as needed    ED Prescriptions     Medication Sig Dispense Auth. Provider   nitrofurantoin , macrocrystal-monohydrate, (MACROBID ) 100 MG capsule Take 1 capsule (100 mg total) by mouth 2 (two) times daily for 7 days. 14 capsule Kayanna Mckillop, Shelba SAUNDERS, NP      PDMP not reviewed this encounter.   Teresa Shelba SAUNDERS, [place] 05/15/24 [phone]

## 2024-05-15 NOTE — ED Triage Notes (Signed)
 Patient reports lower back pain x 4 days. Patient reports difference in urine stream.Rates pain 1/10. Patient has taken cranberry pills for symptoms with no relief.

## 2024-05-15 NOTE — Discharge Instructions (Addendum)
 Your urinalysis does not show infection , your urine will be sent to the lab to determine exactly which bacteria is present, if any changes need to be made to your medications you will be notified  Please return to the clinic with new urine samples that it can be sent to the lab to see if she is able to isolate any bacteria  Begin use of Macrobid  twice daily for 7 days  Increase your fluid intake through use of water  You may take Tylenol  and or Motrin as needed for back pain, may apply warm compresses over the affected area and massage and stretch if tolerable  If symptoms continue to persist after use of medication or recur please follow-up with urgent care or your primary doctor as needed

## 2024-05-20 DIAGNOSIS — Z961 Presence of intraocular lens: Secondary | ICD-10-CM | POA: Diagnosis not present

## 2024-05-20 DIAGNOSIS — E119 Type 2 diabetes mellitus without complications: Secondary | ICD-10-CM | POA: Diagnosis not present

## 2024-05-20 DIAGNOSIS — H35373 Puckering of macula, bilateral: Secondary | ICD-10-CM | POA: Diagnosis not present

## 2024-05-20 LAB — OPHTHALMOLOGY REPORT-SCANNED

## 2024-05-24 ENCOUNTER — Other Ambulatory Visit: Payer: Self-pay | Admitting: Cardiovascular Disease

## 2024-05-24 NOTE — Progress Notes (Signed)
 [name] T. Gregory Totino, MD, CAQ Sports Medicine [facility] at [facility] [address] [place], [address]  Phone: [phone]  FAX: [phone]  [name] - 78 y.o. male  MRN [id number]  Date of Birth: 1946-06-21  Date: 05/25/2024  PCP: Watt Mirza, MD  Referral: Watt Mirza, MD  No chief complaint on file.  Subjective:   [name] is a 78 y.o. very pleasant male patient with There is no height or weight on file to calculate BMI. who presents with the following:  Discussed the use of AI scribe software for clinical note transcription with the patient, who gave verbal consent to proceed.  [name] is here for 53-month follow-up after his physical in July 2025.  The last time I saw him, his blood sugar continued to rise, and at that point he wanted to radically alter his diet and stop drinking alcohol.  At the time he was eating a lot of carbs and drinking a fair bit, as well.  Diabetes Mellitus: Tolerating Medications: yes Compliance with diet: fair, There is no height or weight on file to calculate BMI. Exercise: minimal / intermittent Avg blood sugars at home: not checking Foot problems: none Hypoglycemia: none No nausea, vomitting, blurred vision, polyuria.  Lab Results  Component Value Date   HGBA1C 7.7 (H) 02/17/2024   HGBA1C 7.3 (A) 08/26/2023   HGBA1C 7.4 (H) 03/06/2023   Lab Results  Component Value Date   MICROALBUR <0.7 02/17/2024   LDLCALC 59 02/17/2024   CREATININE 1.27 02/17/2024    Wt Readings from Last 3 Encounters:  02/24/24 166 lb 8 oz (75.5 kg)  08/26/23 168 lb 2 oz (76.3 kg)  08/08/23 166 lb (75.3 kg)    History of Present Illness     Review of Systems is noted in the HPI, as appropriate  Objective:   There were no vitals taken for this visit.  GEN: No acute distress; alert,appropriate. PULM: Breathing comfortably in no respiratory distress PSYCH: Normally interactive.   Laboratory and Imaging  Data:  Assessment and Plan:   No diagnosis found. Assessment & Plan   Medication Management during today's office visit: No orders of the defined types were placed in this encounter.  There are no discontinued medications.  Orders placed today for conditions managed today: No orders of the defined types were placed in this encounter.   Disposition: No follow-ups on file.  Dr[employer] One speech-to-text software was used for transcription in this dictation.  Possible transcriptional errors can occur using Animal nutritionist.   Signed,  Mirza DASEN. Hollyann Pablo, MD   Outpatient Encounter Medications as of 05/25/2024  Medication Sig   ACCU-CHEK SOFTCLIX LANCETS lancets Check blood sugar once daily and as instructed. Dx E11.65   amLODipine  (NORVASC ) 10 MG tablet Take 1 tablet (10 mg total) by mouth daily.   aspirin 81 MG tablet Take 1 tablet (81 mg total) by mouth daily.   atorvastatin  (LIPITOR) 80 MG tablet TAKE 1 TABLET(80 MG) BY MOUTH DAILY   Blood Glucose Monitoring Suppl (ACCU-CHEK AVIVA PLUS) w/Device KIT Check blood sugar once daily and as instructed. Dx E11.65   calcium  carbonate (TUMS - DOSED IN MG ELEMENTAL CALCIUM ) 500 MG chewable tablet Chew 1 tablet by mouth daily as needed for indigestion or heartburn.   doxazosin  (CARDURA ) 1 MG tablet TAKE 1 TABLET(1 MG) BY MOUTH DAILY   fish oil-omega-3 fatty acids 1000 MG capsule Take 2 g by mouth daily.   glucose blood (  TRUE METRIX BLOOD GLUCOSE TEST) test strip USE TO CHECK BLOOD SUGAR ONCE DAILY AND AS INSTRUCTED   losartan  (COZAAR ) 100 MG tablet TAKE 1 TABLET(100 MG) BY MOUTH DAILY   metFORMIN  (GLUCOPHAGE -XR) 500 MG 24 hr tablet TAKE 4 TABLETS BY MOUTH EVERY DAY WITH BREAKFAST   metoprolol  succinate (TOPROL -XL) 50 MG 24 hr tablet Take 1 tablet (50 mg total) by mouth daily. Take with or immediately following a meal.   Multiple Vitamin (MULTIVITAMIN) tablet Take 1 tablet by mouth daily.   No facility-administered encounter  medications on file as of 05/25/2024.

## 2024-05-25 ENCOUNTER — Encounter: Payer: Self-pay | Admitting: Family Medicine

## 2024-05-25 ENCOUNTER — Ambulatory Visit: Admitting: Family Medicine

## 2024-05-25 VITALS — BP 132/66 | HR 64 | Temp 98.1°F | Ht 66.0 in | Wt 172.1 lb

## 2024-05-25 DIAGNOSIS — E119 Type 2 diabetes mellitus without complications: Secondary | ICD-10-CM | POA: Diagnosis not present

## 2024-05-25 DIAGNOSIS — Z23 Encounter for immunization: Secondary | ICD-10-CM | POA: Diagnosis not present

## 2024-05-25 DIAGNOSIS — F1010 Alcohol abuse, uncomplicated: Secondary | ICD-10-CM | POA: Diagnosis not present

## 2024-05-25 DIAGNOSIS — Z7984 Long term (current) use of oral hypoglycemic drugs: Secondary | ICD-10-CM | POA: Diagnosis not present

## 2024-05-25 LAB — POCT GLYCOSYLATED HEMOGLOBIN (HGB A1C): Hemoglobin A1C: 7.3 % — AB (ref 4.0–5.6)

## 2024-06-08 ENCOUNTER — Other Ambulatory Visit: Payer: Self-pay | Admitting: Cardiovascular Disease

## 2024-06-08 DIAGNOSIS — R011 Cardiac murmur, unspecified: Secondary | ICD-10-CM | POA: Diagnosis not present

## 2024-06-08 DIAGNOSIS — R32 Unspecified urinary incontinence: Secondary | ICD-10-CM | POA: Diagnosis not present

## 2024-06-08 DIAGNOSIS — Z87440 Personal history of urinary (tract) infections: Secondary | ICD-10-CM | POA: Diagnosis not present

## 2024-06-08 DIAGNOSIS — M545 Low back pain, unspecified: Secondary | ICD-10-CM | POA: Diagnosis not present

## 2024-06-08 DIAGNOSIS — K219 Gastro-esophageal reflux disease without esophagitis: Secondary | ICD-10-CM | POA: Diagnosis not present

## 2024-06-08 DIAGNOSIS — I10 Essential (primary) hypertension: Secondary | ICD-10-CM | POA: Diagnosis not present

## 2024-06-08 DIAGNOSIS — F1010 Alcohol abuse, uncomplicated: Secondary | ICD-10-CM | POA: Diagnosis not present

## 2024-06-08 DIAGNOSIS — Z8249 Family history of ischemic heart disease and other diseases of the circulatory system: Secondary | ICD-10-CM | POA: Diagnosis not present

## 2024-06-08 DIAGNOSIS — E1151 Type 2 diabetes mellitus with diabetic peripheral angiopathy without gangrene: Secondary | ICD-10-CM | POA: Diagnosis not present

## 2024-06-08 DIAGNOSIS — H3530 Unspecified macular degeneration: Secondary | ICD-10-CM | POA: Diagnosis not present

## 2024-06-08 DIAGNOSIS — N400 Enlarged prostate without lower urinary tract symptoms: Secondary | ICD-10-CM | POA: Diagnosis not present

## 2024-06-08 DIAGNOSIS — E785 Hyperlipidemia, unspecified: Secondary | ICD-10-CM | POA: Diagnosis not present

## 2024-06-08 DIAGNOSIS — I2510 Atherosclerotic heart disease of native coronary artery without angina pectoris: Secondary | ICD-10-CM | POA: Diagnosis not present

## 2024-06-09 ENCOUNTER — Telehealth: Payer: Self-pay | Admitting: Cardiovascular Disease

## 2024-06-09 MED ORDER — DOXAZOSIN MESYLATE 1 MG PO TABS: 1.0000 mg | ORAL_TABLET | Freq: Every day | ORAL | 1 refills | Status: DC

## 2024-06-09 NOTE — Telephone Encounter (Signed)
 Pt's medication was sent to pt's pharmacy as requested. Confirmation received.

## 2024-06-09 NOTE — Telephone Encounter (Signed)
*  STAT* If patient is at the pharmacy, call can be transferred to refill team.   1. Which medications need to be refilled? (please list name of each medication and dose if known)   doxazosin  (CARDURA ) 1 MG tablet    2. Which pharmacy/location (including street and city if local pharmacy) is medication to be sent to? [id number] - [place], [place] - [address] AT [facility] OF [address]   3. Do they need a 30 day or 90 day supply? 90   Patient has appt on 12/8

## 2024-06-15 ENCOUNTER — Other Ambulatory Visit: Payer: Self-pay | Admitting: Cardiovascular Disease

## 2024-06-18 ENCOUNTER — Other Ambulatory Visit: Payer: Self-pay

## 2024-06-19 MED ORDER — ATORVASTATIN CALCIUM 80 MG PO TABS: ORAL_TABLET | ORAL | 0 refills | Status: DC

## 2024-07-17 ENCOUNTER — Other Ambulatory Visit: Payer: Self-pay | Admitting: Family Medicine
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
## 2024-07-27 ENCOUNTER — Encounter: Payer: Self-pay | Admitting: Cardiovascular Disease

## 2024-07-27 ENCOUNTER — Ambulatory Visit: Attending: Cardiovascular Disease | Admitting: Cardiovascular Disease

## 2024-07-27 VITALS — BP 130/67 | HR 63 | Ht 66.0 in | Wt 174.2 lb

## 2024-07-27 DIAGNOSIS — I10 Essential (primary) hypertension: Secondary | ICD-10-CM | POA: Diagnosis not present

## 2024-07-27 DIAGNOSIS — I25118 Atherosclerotic heart disease of native coronary artery with other forms of angina pectoris: Secondary | ICD-10-CM | POA: Diagnosis not present

## 2024-07-27 DIAGNOSIS — E1165 Type 2 diabetes mellitus with hyperglycemia: Secondary | ICD-10-CM | POA: Diagnosis not present

## 2024-07-27 DIAGNOSIS — Z7984 Long term (current) use of oral hypoglycemic drugs: Secondary | ICD-10-CM

## 2024-07-27 DIAGNOSIS — E782 Mixed hyperlipidemia: Secondary | ICD-10-CM | POA: Diagnosis not present

## 2024-07-27 DIAGNOSIS — E785 Hyperlipidemia, unspecified: Secondary | ICD-10-CM | POA: Diagnosis not present

## 2024-07-27 DIAGNOSIS — E119 Type 2 diabetes mellitus without complications: Secondary | ICD-10-CM

## 2024-07-27 MED ORDER — DOXAZOSIN MESYLATE 4 MG PO TABS: 4.0000 mg | ORAL_TABLET | Freq: Every day | ORAL | 3 refills | Status: AC

## 2024-07-27 NOTE — Patient Instructions (Addendum)
 Medication Instructions:   Please stop the amlodipine   Increase the cardura /doxazosin  up to 4 mg in the Am  If you need a refill on your cardiac medications before your next appointment, please call your pharmacy.   Lab work: No new labs needed  Testing/Procedures: No new testing needed  Follow-Up: At [facility], you and your health needs are our priority.  As part of our continuing mission to provide you with exceptional heart care, we have created designated Provider Care Teams.  These Care Teams include your primary Cardiologist (physician) and Advanced Practice Providers (APPs -  Physician Assistants and Nurse Practitioners) who all work together to provide you with the care you need, when you need it.  You will need a follow up appointment in 12 months  Providers on your designated Care Team:   Lonni Meager, NP Bernardino Bring, PA-C Cadence [place], [place]  COVID-19 Vaccine Information can be found at: [email or web address] For questions related to vaccine distribution or appointments, please email [email or web address] or call [phone].

## 2024-07-30 ENCOUNTER — Other Ambulatory Visit: Payer: Self-pay | Admitting: Cardiovascular Disease

## 2024-07-31 ENCOUNTER — Ambulatory Visit: Payer: Medicare HMO

## 2024-07-31 VITALS — Ht 66.0 in | Wt 174.0 lb

## 2024-07-31 DIAGNOSIS — Z0000 Encounter for general adult medical examination without abnormal findings: Secondary | ICD-10-CM | POA: Diagnosis not present

## 2024-07-31 NOTE — Patient Instructions (Signed)
 [name],  Thank you for taking the time for your Medicare Wellness Visit. I appreciate your continued commitment to your health goals. Please review the care plan we discussed, and feel free to reach out if I can assist you further.  Please note that Annual Wellness Visits do not include a physical exam. Some assessments may be limited, especially if the visit was conducted virtually. If needed, we may recommend an in-person follow-up with your provider.  Ongoing Care Seeing your primary care provider every 3 to 6 months helps us  monitor your health and provide consistent, personalized care.   Referrals If a referral was made during today's visit and you haven't received any updates within two weeks, please contact the referred provider directly to check on the status.  Recommended Screenings:  Health Maintenance  Topic Date Due   Zoster (Shingles) Vaccine (1 of 2) Never done   DTaP/Tdap/Td vaccine (3 - Tdap) 04/13/2019   COVID-19 Vaccine (3 - Moderna risk series) 01/12/2020   Medicare Annual Wellness Visit  07/30/2024   Hemoglobin A1C  11/23/2024   Yearly kidney function blood test for diabetes  02/16/2025   Yearly kidney health urinalysis for diabetes  02/16/2025   Complete foot exam   02/23/2025   Eye exam for diabetics  05/20/2025   Pneumococcal Vaccine for age over 52  Completed   Flu Shot  Completed   Hepatitis C Screening  Completed   Meningitis B Vaccine  Aged Out   Cologuard (Stool DNA test)  Discontinued       07/31/2024    8:51 AM  Advanced Directives  Does Patient Have a Medical Advance Directive? Yes  Type of Advance Directive Living will;Healthcare Power of Attorney  Does patient want to make changes to medical advance directive? No - Patient declined  Copy of Healthcare Power of Attorney in Chart? No - copy requested    Vision: Annual vision screenings are recommended for early detection of glaucoma, cataracts, and diabetic retinopathy. These exams can also  reveal signs of chronic conditions such as diabetes and high blood pressure.  Dental: Annual dental screenings help detect early signs of oral cancer, gum disease, and other conditions linked to overall health, including heart disease and diabetes.
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
## 2024-08-02 ENCOUNTER — Other Ambulatory Visit: Payer: Self-pay | Admitting: Family Medicine

## 2024-08-11 ENCOUNTER — Other Ambulatory Visit: Payer: Self-pay | Admitting: Cardiovascular Disease

## 2024-08-22 ENCOUNTER — Other Ambulatory Visit: Payer: Self-pay | Admitting: Cardiovascular Disease

## 2024-09-21 ENCOUNTER — Other Ambulatory Visit: Payer: Self-pay | Admitting: Cardiovascular Disease

## 2025-02-22 ENCOUNTER — Ambulatory Visit: Admitting: Family Medicine

## 2025-02-24 ENCOUNTER — Ambulatory Visit: Admitting: Family Medicine

## 2025-08-03 ENCOUNTER — Ambulatory Visit
